# Patient Record
Sex: Male | Born: 1960 | Race: White | Hispanic: No | Marital: Married | Smoking: Never smoker
Health system: Southern US, Community
[De-identification: ages and names within clinical notes are randomized; demographics above are authoritative.]

## PROBLEM LIST (undated history)

## (undated) DIAGNOSIS — E669 Obesity, unspecified: Secondary | ICD-10-CM

## (undated) DIAGNOSIS — M1990 Unspecified osteoarthritis, unspecified site: Secondary | ICD-10-CM

## (undated) DIAGNOSIS — D649 Anemia, unspecified: Secondary | ICD-10-CM

## (undated) DIAGNOSIS — I10 Essential (primary) hypertension: Secondary | ICD-10-CM

## (undated) DIAGNOSIS — K219 Gastro-esophageal reflux disease without esophagitis: Secondary | ICD-10-CM

## (undated) DIAGNOSIS — R911 Solitary pulmonary nodule: Secondary | ICD-10-CM

## (undated) DIAGNOSIS — J309 Allergic rhinitis, unspecified: Secondary | ICD-10-CM

## (undated) DIAGNOSIS — E538 Deficiency of other specified B group vitamins: Secondary | ICD-10-CM

## (undated) DIAGNOSIS — M779 Enthesopathy, unspecified: Secondary | ICD-10-CM

## (undated) HISTORY — DX: Deficiency of other specified B group vitamins: E53.8

## (undated) HISTORY — DX: Gastro-esophageal reflux disease without esophagitis: K21.9

## (undated) HISTORY — DX: Unspecified osteoarthritis, unspecified site: M19.90

## (undated) HISTORY — DX: Obesity, unspecified: E66.9

## (undated) HISTORY — PX: UPPER GASTROINTESTINAL ENDOSCOPY: SHX188

## (undated) HISTORY — DX: Anemia, unspecified: D64.9

## (undated) HISTORY — DX: Enthesopathy, unspecified: M77.9

## (undated) HISTORY — DX: Allergic rhinitis, unspecified: J30.9

## (undated) HISTORY — PX: EYE SURGERY: SHX253

## (undated) HISTORY — PX: BACK SURGERY: SHX140

## (undated) HISTORY — PX: SPINE SURGERY: SHX786

## (undated) HISTORY — PX: COLONOSCOPY: SHX174

---
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
## 1997-08-09 ENCOUNTER — Ambulatory Visit (HOSPITAL_COMMUNITY): Admission: RE | Admit: 1997-08-09 | Discharge: 1997-08-09 | Payer: Self-pay | Admitting: *Deleted

## 1999-11-25 ENCOUNTER — Ambulatory Visit (HOSPITAL_COMMUNITY)
Admission: RE | Admit: 1999-11-25 | Discharge: 1999-11-25 | Payer: Self-pay | Admitting: Physical Medicine and Rehabilitation

## 1999-11-25 ENCOUNTER — Encounter: Payer: Self-pay | Admitting: Physical Medicine and Rehabilitation

## 2001-02-18 HISTORY — PX: CERVICAL DISC SURGERY: SHX588

## 2002-07-21 ENCOUNTER — Ambulatory Visit (HOSPITAL_COMMUNITY): Admission: RE | Admit: 2002-07-21 | Discharge: 2002-07-21 | Payer: Self-pay | Admitting: Internal Medicine

## 2002-07-21 ENCOUNTER — Encounter: Payer: Self-pay | Admitting: Internal Medicine

## 2003-04-23 ENCOUNTER — Emergency Department (HOSPITAL_COMMUNITY): Admission: EM | Admit: 2003-04-23 | Discharge: 2003-04-23 | Payer: Self-pay | Admitting: Emergency Medicine

## 2003-04-24 ENCOUNTER — Inpatient Hospital Stay (HOSPITAL_COMMUNITY): Admission: EM | Admit: 2003-04-24 | Discharge: 2003-04-29 | Payer: Self-pay | Admitting: Emergency Medicine

## 2003-12-26 ENCOUNTER — Ambulatory Visit (HOSPITAL_COMMUNITY): Admission: RE | Admit: 2003-12-26 | Discharge: 2003-12-27 | Payer: Self-pay | Admitting: Neurosurgery

## 2007-09-25 ENCOUNTER — Ambulatory Visit: Payer: Self-pay | Admitting: Cardiology

## 2007-10-16 ENCOUNTER — Ambulatory Visit: Payer: Self-pay

## 2007-10-16 ENCOUNTER — Ambulatory Visit: Payer: Self-pay | Admitting: Cardiology

## 2008-12-29 ENCOUNTER — Encounter: Admission: RE | Admit: 2008-12-29 | Discharge: 2008-12-29 | Payer: Self-pay | Admitting: Neurosurgery

## 2008-12-31 ENCOUNTER — Ambulatory Visit (HOSPITAL_COMMUNITY): Admission: RE | Admit: 2008-12-31 | Discharge: 2009-01-01 | Payer: Self-pay | Admitting: Neurosurgery

## 2010-05-23 LAB — CBC
HCT: 43.1 % (ref 39.0–52.0)
Hemoglobin: 14.9 g/dL (ref 13.0–17.0)
MCHC: 34.7 g/dL (ref 30.0–36.0)
MCV: 90.3 fL (ref 78.0–100.0)
Platelets: 201 10*3/uL (ref 150–400)
RBC: 4.78 MIL/uL (ref 4.22–5.81)
RDW: 13.3 % (ref 11.5–15.5)
WBC: 6 10*3/uL (ref 4.0–10.5)

## 2010-05-23 LAB — BASIC METABOLIC PANEL
BUN: 16 mg/dL (ref 6–23)
CO2: 27 mEq/L (ref 19–32)
Calcium: 9 mg/dL (ref 8.4–10.5)
Chloride: 104 mEq/L (ref 96–112)
Creatinine, Ser: 0.92 mg/dL (ref 0.4–1.5)
GFR calc Af Amer: >60 mL/min (ref >60)
GFR calc non Af Amer: >60 mL/min (ref >60)
Glucose, Bld: 101 mg/dL — ABNORMAL HIGH (ref 70–99)
Potassium: 3.9 mEq/L (ref 3.5–5.1)
Sodium: 136 mEq/L (ref 135–145)

## 2010-07-03 NOTE — Procedures (Signed)
[facility]                              EXERCISE TREADMILL   NAME:Albert Griffin, Albert Griffin                     MRN:          [id number]  DATE:10/16/2007                            DOB:          1961/02/06    PROCEDURE:  Exercise treadmill test.   INDICATIONS:  Evaluate the patient with chest pain.   HISTORY OF PRESENT ILLNESS:  The patient is a pleasant 50 year old  gentleman with chest discomfort.  He was exercised using standard Bruce  protocol.  He was able to exercise for 12 minutes, which completed stage  IV.  The test was terminated because he achieved his target heart rate.  His peak heart rate was 157, which was 90% of predicted.  He achieved  13.6 mets.  His blood pressure had an appropriate response with a max of  187/72.  He had no chest pressure and appropriate shortness of breath.  There were no arrhythmias.  There were no ischemic ST-T wave changes.  He had a normal heart rate recovery.   CONCLUSION:  Negative adequate exercise treadmill test.   PLAN:  Based on the above, the patient has a low post test probability  of high-grade obstructive coronary disease.  He had a good exercise  tolerance.  He is in the low risk category for future cardiovascular  events.  He was given a prescription for exercise based on this study.     Rollene Rotunda, MD, [facility]  Electronically Signed    JH/MedQ  DD: 10/16/2007  DT: 10/17/2007  Job #: [id number]   cc:   Lovenia Kim, D.O.

## 2010-07-03 NOTE — Assessment & Plan Note (Signed)
HEALTHCARE                            CARDIOLOGY OFFICE NOTE   NAME:Albert Griffin, Albert Griffin                     MRN:          [id number]  DATE:09/25/2007                            DOB:          1960-06-21    PRIMARY CARE PHYSICIAN:  Lovenia Kim, DO   REASON FOR CONSULTATION:  Evaluate the patient with chest pain.   HISTORY OF PRESENT ILLNESS:  The patient is a very pleasant 50 year old  white gentleman with no prior cardiac history.  He has had some chest  discomfort.  Over the last 3-4 weeks he has noticed this infrequently  when on his treadmill.  About 3 weeks ago he did have an episode at  rest.  This was a persistent discomfort that lasted for 2 days.  He  described it is left-sided.  It was a pressure-like discomfort.  He had  not had it before.  It was some aching down his left arm, but it is  difficult for him to sort this out from his tendonitis.  It was 3/10 in  intensity.  He did not take anything for it.  It was not exacerbated by  anything.  It went away spontaneously.  He has had some mild nausea  recently, which had been unusual for him.  He still is able to get on a  treadmill and not reproducibly have chest discomfort.  He gets his heart  rates into the 140s often and stays on the treadmill for half hour at a  time 5 days a week.  He denies any palpitation, presyncope, or syncope.  He has had no PND or orthopnea.   PAST MEDICAL HISTORY:  1. Hypertension x15 years.  2. Peptic ulcer disease.  3. Gastroesophageal reflux disease.  4. Tendonitis.   PAST SURGICAL HISTORY:  Lumbar disk surgery x3.   ALLERGIES:  None.   MEDICATIONS:  1. Cozaar 50 mg daily.  2. Nexium 40 mg daily.   SOCIAL HISTORY:  The patient is married.  He has 2 children.  He is a  Games developer.  He never really smoked cigarettes.   FAMILY HISTORY:  Noncontributory for first-degree relatives with early  onset heart disease, so he has multiple  second-degree relatives with  heart disease at later ages.   REVIEW OF SYSTEMS:  As stated in the HPI, positive for occasional  dizziness, some chronic back pains, tendonitis in both arms.   PHYSICAL EXAMINATION:  GENERAL:  The patient is pleasant in no distress.  VITAL SIGNS:  Weight 227 pounds.  Blood pressure 125/86, heart rate 63  and regular.  HEENT:  Eyes unremarkable.  Pupils equal, round, and reactive to light.  Fundi within normal limits.  Oral mucosa unremarkable.  NECK:  No jugular venous distention at 45 degrees.  Carotid upstroke  brisk and symmetrical.  No bruits, no thyromegaly.  LYMPHATICS:  No cervical, axillary, or inguinal adenopathy.  LUNGS:  Clear to auscultation bilaterally.  BACK:  No costovertebral angle tenderness.  CHEST:  Unremarkable.  HEART:  PMI not displaced or sustained.  S1 and S2 within normal limits.  No  S3.  No S4.  No clicks, no rubs, no murmurs.  ABDOMEN:  Flat.  Positive bowel sounds.  Normal in frequency and pitch.  No bruits, no rebound, no guarding.  No midline pulsatile mass.  No  hepatomegaly.  No splenomegaly.  SKIN:  No rashes.  No nodules.  EXTREMITIES:  2+ pulses throughout.  No edema, cyanosis, or clubbing.  NEURO:  Oriented to person, place, and time.  Cranial nerves II through  XII grossly intact.  Motor grossly intact.   EKG (done at Dr. Hardie Pulley office), sinus rhythm, rate 63.  Axis  within normal limits.  Intervals within normal limits.  No acute ST-wave  changes.   ASSESSMENT/PLAN:  1. Chest.  The patient's chest discomfort is somewhat atypical.  I      think the pretest probability of obstructive coronary disease is      low to low moderate.  Given his risk factors, screening with an      exercise treadmill test will be very reasonable.  This will give me      a reasonable chance of ruling out obstructive coronary disease.  It      will allow me to risk stratify and most importantly give him a      prescription for  exercise.  2. Hypertension.  Blood pressure is well controlled and he will      continue the medications as listed.  3. Obesity.  The patient is somewhat overweight and we will discuss      weight loss strategies with diet and exercise.  4. Follow up will be at the time of his treadmill.     Rollene Rotunda, MD, [facility]  Electronically Signed    JH/MedQ  DD: 09/25/2007  DT: 09/26/2007  Job #: [id number]   cc:   Lovenia Kim, D.O.

## 2010-07-06 NOTE — Discharge Summary (Signed)
NAME:  Albert Griffin, Albert Griffin                          ACCOUNT NO.:  [id number]   MEDICAL RECORD NO.:  [id number]                   PATIENT TYPE:  INP   LOCATION:  0449                                 FACILITY:  [facility]   PHYSICIAN:  Ara D. Tammi Klippel, M.D.                DATE OF BIRTH:  1960/10/06   DATE OF ADMISSION:  04/24/2003  DATE OF DISCHARGE:  04/29/2003                                 DISCHARGE SUMMARY   PRIMARY CARE PHYSICIAN:  Lovenia Kim, D.O.   FINAL DIAGNOSES:  1. Left upper extremity cellulitis.  2. Hypertension.  3. Peptic ulcer disease.  4. Chronic low back pain.   FINAL PROCEDURES:  1. Chest x-ray performed April 24, 2003 showing small metallic foreign body     seen in the region of the web space at the left first and second     metacarpals. No evidence by plain film for osteomyelitis.  2. Eye x-rays performed April 26, 2003. Impression, no evidence of metallic     foreign body.  3. MRI of the left upper extremity performed April 27, 2003 showing     edematous/inflammatory changes in the subcutaneous fatty tissues     overlying the olecranon process and the proximal 10 cm or the dorsal     forearm. No sign of drainable abscess or involvement of the deep spaces     of the arm. No sign of osteomyelitis or abnormal fluid in the joints.   PERTINENT LABS AND OTHER TEST RESULTS:  On the day of discharge, white blood  cells 8.6 from a high of 16.3, H&H 14.6/42.5 with a platelet count of 315.  Sodium 138, potassium 4.5, chloride 103, carbon dioxide 30, BUN 12,  creatinine 1.2, glucose 109. Calcium 9.0, C reactive protein 9.9, sed rate  68.   HOSPITAL COURSE:  The patient is a very pleasant 50 year old white male with  past medical history as listed above who was admitted for a left upper  extremity cellulitis.  The patient was started on IV ampicillin/sulbactam  and after two days his rash and erythema eventually progressed from the line  of demarcation. He thereafter  progressively felt symptomatically better with  less edema, less erythema, and less pain. On April 28, 2003, the  ampicillin/sulbactam was discontinued and he was started on  amoxicillin/clavulanic acid. He had no resumption of fevers, no chills,  sweats, and no increase in his white blood cell count. On the day of  discharge, the patient was more than eager to go home and leave the  hospital.   DISCHARGE MEDICATIONS:  1. Amoxicillin/clavulanic acid 875 mg p.o. b.i.d. x14 days.  2. Losartan 50 mg p.o. q.d.  3. Esomeprazole 40 mg p.o. q.d.  4. Meloxicam 7.5 mg p.o. q.d.   DISCHARGE INSTRUCTIONS:  1. The patient is to take his medications as prescribed.  2. He is to take his antibiotic course fully and completely.  3. He is to followup with Dr. Elisabeth Most in approximately two weeks.  4. He is to return if he feels worse.                                               Ara D. Tammi Klippel, M.D.    ADM/MEDQ  D:  04/29/2003  T:  04/30/2003  Job:  [id number]   cc:   Lovenia Kim, D.O.  [address]  [place]  [place] [address]  Fax: [phone]

## 2010-07-06 NOTE — H&P (Signed)
[name], Albert Griffin                          ACCOUNT NO.:  [id number]   MEDICAL RECORD NO.:  [id number]                   PATIENT TYPE:  INP   LOCATION:  0449                                 FACILITY:  [facility]   PHYSICIAN:  Renato Battles, M.D.                  DATE OF BIRTH:  March 10, 1960   DATE OF ADMISSION:  04/23/2003  DATE OF DISCHARGE:                                HISTORY & PHYSICAL   REASON FOR ADMISSION:  Left upper extremity erythema.   HISTORY OF PRESENT ILLNESS:  The patient is a 50 year old white male with a  left upper extremity lesion that started as a very small lesion on Thursday,  of unknown origin, probably from his work.  The patient started to take  Keflex on Friday evening, and Saturday he woke up with large erythema  extending over most of his upper extremity left forearm.  The patient denied  any discomfort.  No nausea or vomiting.  He reports occasional fever.   REVIEW OF SYSTEMS:  As per HPI.   PAST MEDICAL HISTORY:  1. Hypertension.  2. Peptic ulcer disease.   PAST SURGICAL HISTORY:  None.   ALLERGIES:  No known drug allergies according to the patient.   FAMILY HISTORY:  Mother with cancer.   SOCIAL HISTORY:  Denies tobacco or drugs.  Reports occasional alcohol.  Works as a Curator.   MEDICATIONS:  1. Cozaar 50 mg p.o. daily.  2. Nexium 40 mg p.o. daily.  3. Mobic 7.5 mg p.o. daily.   PHYSICAL EXAM:  GENERAL:  Alert and oriented x3.  In no acute distress.  VITAL SIGNS:  Temperature 101.5, heart rate 105, respiratory rate 18, blood  pressure 129/74.  HEENT:  Head is normocephalic, atraumatic.  Pupils are equal, round, and  reactive to light and accommodation.  NECK:  No lymphadenopathy, no thyromegaly, no JVD.  CHEST:  Clear to auscultation bilaterally.  No wheezing, rales, or rhonchi.  HEART:  Regular rate and rhythm.  No murmurs, gallops, or rubs.  ABDOMEN:  Soft, nontender, nondistended.  Normoactive bowel sounds.  EXTREMITIES:  No cyanosis  or clubbing.  Has extensive erythema left upper  extremity with some streaking.  No fluctuation.   LABORATORY STUDIES:  White count 16.3 with 85% neutrophils.  __________ are  within normal.  Liver functions within normal.  Blood culture is pending.   ASSESSMENT AND PLAN:  1. Cellulitis, most likely secondary to Streptococcus.  Blood culture is     currently pending.  Am going to start the patient on IV Unasyn and use     ibuprofen for analgesia.  2. Hypertension.  Continue Cozaar and start low-salt diet.  3. Peptic ulcer disease.  The patient will be placed on Protonix during his     hospitalization.   PRIMARY CARE DOCTOR:  Lovenia Kim, D.O.  Renato Battles, M.D.    SA/MEDQ  D:  04/24/2003  T:  04/24/2003  Job:  [id number]

## 2010-07-06 NOTE — Op Note (Signed)
NAMEJahmani, Staup Aaden              ACCOUNT NO.:  [id number]   MEDICAL RECORD NO.:  [id number]          PATIENT TYPE:  OIB   LOCATION:  2864                         FACILITY:  [facility]   PHYSICIAN:  Hewitt Shorts, M.D.DATE OF BIRTH:  01/31/1961   DATE OF PROCEDURE:  12/26/2003  DATE OF DISCHARGE:                                 OPERATIVE REPORT   PREOPERATIVE DIAGNOSIS:  Right L5-S1 lumbar disk herniation, lumbar  degenerative disk disease, lumbar spondylosis and lumbar radiculopathy.   POSTOPERATIVE DIAGNOSIS:  Right L5-S1 lumbar disk herniation, lumbar  degenerative disk disease, lumbar spondylosis and lumbar radiculopathy.   OPERATION PERFORMED:  Right L5-S1 lumbar laminotomy and microdiskectomy with  microdissection.   SURGEON:  Hewitt Shorts, M.D.   ASSISTANT:  Hilda Lias, M.D.   ANESTHESIA:  General endotracheal.   INDICATIONS FOR PROCEDURE:  The patient is a 50 year old man who presented  with a right lumbar radiculopathy.  He was found by MRI scan to have a large  right L5-S1 lumbar disk herniation.  A decision was made to proceed with  elective laminotomy and microdiskectomy.   DESCRIPTION OF PROCEDURE:  The patient was brought to the operating room and  placed under general endotracheal anesthesia.  The patient was turned to a  prone position.  Lumbar region was prepped with Betadine soap and solution  and draped in sterile fashion.  The midline was infiltrated with local  anesthetic with epinephrine.  X-ray was taken and the L5-S1 level  identified.  A midline incision was made and carried down to the  subcutaneous tissue.  Bipolar cautery and electrocautery were used to  maintain hemostasis.  Dissection was carried down to the lumbar fascia which  was incised on the right side of the midline in the paraspinal muscles.  We  dissected the spinous process and lamina in subperiosteal fashion.  Another  X-ray was taken and the L5-S1 interlaminar space was  identified.  A  laminotomy was performed using the X-Max drill and Kerrison punches.  The  operating microscope was draped and brought into the field to provide  additional magnification, illumination and visualization and the remainder  of the procedure was performed using microdissection and microsurgical  technique.   The ligamentum flavum was carefully resected and we identified the thecal  sac and exiting right S1 nerve root.  These structures were gently retracted  medially.  Epidural veins were coagulated and divided as necessary.  We were  able to identify the disk herniation.  We incised the annulus and the disk  fragment extruded.  We were able to remove that fragment and then proceeded  with thorough diskectomy removing all loose fragments of disk material from  both the disk space and the epidural space and good decompression of the  thecal sac and nerve root was achieved.  Once the decompression was  completed, hemostasis was established with the use of bipolar cautery.  The  wound was irrigated with bacitracin solution and checked once again for  hemostasis, then we instilled 2 mL of fentanyl and 80 mg of Depo-Medrol into  the epidural space and  proceeded with closure.  The deep fascia was closed  with #1 interrupted undyed Vicryl sutures, the subcutaneous and subcuticular  layer were closed with interrupted inverted 2-0 undyed Vicryl sutures and  skin edges closed with Dermabond.  The patient tolerated the procedure  well.  The estimated blood loss for this procedure was less than 25 mL.  Sponge, needle and instrument counts were correct.  Following surgery the  patient was turned back to supine position to be reversed from anesthetic,  extubated and transferred to recovery room for further care.       RWN/MEDQ  D:  12/26/2003  T:  12/26/2003  Job:  [id number]

## 2011-05-31 ENCOUNTER — Observation Stay (HOSPITAL_COMMUNITY)
Admission: EM | Admit: 2011-05-31 | Discharge: 2011-06-01 | DRG: 140 | Disposition: A | Discharge status: Home / Self Care | Payer: BC Managed Care – PPO | Attending: Internal Medicine | Admitting: Internal Medicine

## 2011-05-31 ENCOUNTER — Encounter (HOSPITAL_COMMUNITY): Payer: Self-pay | Admitting: *Deleted

## 2011-05-31 ENCOUNTER — Emergency Department (HOSPITAL_COMMUNITY): Payer: BC Managed Care – PPO

## 2011-05-31 ENCOUNTER — Other Ambulatory Visit: Payer: Self-pay

## 2011-05-31 DIAGNOSIS — R002 Palpitations: Principal | ICD-10-CM | POA: Insufficient documentation

## 2011-05-31 DIAGNOSIS — R55 Syncope and collapse: Secondary | ICD-10-CM

## 2011-05-31 DIAGNOSIS — I10 Essential (primary) hypertension: Secondary | ICD-10-CM | POA: Insufficient documentation

## 2011-05-31 DIAGNOSIS — R42 Dizziness and giddiness: Secondary | ICD-10-CM | POA: Insufficient documentation

## 2011-05-31 DIAGNOSIS — M79609 Pain in unspecified limb: Secondary | ICD-10-CM | POA: Insufficient documentation

## 2011-05-31 HISTORY — DX: Essential (primary) hypertension: I10

## 2011-05-31 LAB — CBC
HCT: 42.8 % (ref 39.0–52.0)
Hemoglobin: 14.9 g/dL (ref 13.0–17.0)
MCH: 30.3 pg (ref 26.0–34.0)
MCHC: 34.8 g/dL (ref 30.0–36.0)
RDW: 13.2 % (ref 11.5–15.5)

## 2011-05-31 LAB — CK TOTAL AND CKMB (NOT AT ARMC)
CK, MB: 4.1 ng/mL — ABNORMAL HIGH (ref 0.3–4.0)
Relative Index: 1.6 (ref 0.0–2.5)
Total CK: 257 U/L — ABNORMAL HIGH (ref 7–232)

## 2011-05-31 LAB — COMPREHENSIVE METABOLIC PANEL
Albumin: 4.3 g/dL (ref 3.5–5.2)
BUN: 17 mg/dL (ref 6–23)
Calcium: 9.7 mg/dL (ref 8.4–10.5)
Creatinine, Ser: 1 mg/dL (ref 0.50–1.35)
Total Protein: 7.2 g/dL (ref 6.0–8.3)

## 2011-05-31 LAB — DIFFERENTIAL
Basophils Relative: 0 % (ref 0–1)
Eosinophils Absolute: 0 10*3/uL (ref 0.0–0.7)
Monocytes Absolute: 0.4 10*3/uL (ref 0.1–1.0)
Monocytes Relative: 4 % (ref 3–12)

## 2011-05-31 LAB — POCT I-STAT TROPONIN I: Troponin i, poc: 0 ng/mL (ref 0.00–0.08)

## 2011-05-31 NOTE — ED Notes (Signed)
The pt has had approx 14  Head rushes and felt like he was going to pass out.  He had some lt arm pain and numbness.   Alert no distress

## 2011-06-01 ENCOUNTER — Encounter (HOSPITAL_COMMUNITY): Payer: Self-pay | Admitting: Internal Medicine

## 2011-06-01 ENCOUNTER — Observation Stay (HOSPITAL_COMMUNITY): Payer: BC Managed Care – PPO

## 2011-06-01 DIAGNOSIS — R072 Precordial pain: Secondary | ICD-10-CM

## 2011-06-01 DIAGNOSIS — I200 Unstable angina: Secondary | ICD-10-CM

## 2011-06-01 DIAGNOSIS — R079 Chest pain, unspecified: Secondary | ICD-10-CM

## 2011-06-01 DIAGNOSIS — I10 Essential (primary) hypertension: Secondary | ICD-10-CM | POA: Diagnosis present

## 2011-06-01 LAB — URINALYSIS, ROUTINE W REFLEX MICROSCOPIC
Bilirubin Urine: NEGATIVE
Ketones, ur: 15 mg/dL — AB
Nitrite: NEGATIVE
Urobilinogen, UA: 0.2 mg/dL (ref 0.0–1.0)
pH: 6 (ref 5.0–8.0)

## 2011-06-01 LAB — LIPID PANEL
HDL: 52 mg/dL (ref >39)
LDL Cholesterol: 84 mg/dL (ref 0–99)
Total CHOL/HDL Ratio: 2.8 RATIO
Triglycerides: 36 mg/dL (ref <150)

## 2011-06-01 LAB — CBC
Hemoglobin: 13.5 g/dL (ref 13.0–17.0)
MCHC: 34.1 g/dL (ref 30.0–36.0)
RBC: 4.53 MIL/uL (ref 4.22–5.81)
WBC: 7.2 10*3/uL (ref 4.0–10.5)

## 2011-06-01 LAB — COMPREHENSIVE METABOLIC PANEL
ALT: 19 U/L (ref 0–53)
BUN: 16 mg/dL (ref 6–23)
CO2: 25 mEq/L (ref 19–32)
Calcium: 9.2 mg/dL (ref 8.4–10.5)
Creatinine, Ser: 0.91 mg/dL (ref 0.50–1.35)
GFR calc Af Amer: >90 mL/min (ref >90)
GFR calc non Af Amer: >90 mL/min (ref >90)
Glucose, Bld: 94 mg/dL (ref 70–99)

## 2011-06-01 LAB — CARDIAC PANEL(CRET KIN+CKTOT+MB+TROPI)
CK, MB: 3.2 ng/mL (ref 0.3–4.0)
Total CK: 179 U/L (ref 7–232)
Total CK: 175 U/L (ref 7–232)
Troponin I: <0.3 ng/mL (ref <0.30)

## 2011-06-01 LAB — HEMOGLOBIN A1C: Hgb A1c MFr Bld: 5.6 % (ref <5.7)

## 2011-06-01 LAB — MAGNESIUM: Magnesium: 2 mg/dL (ref 1.5–2.5)

## 2011-06-01 MED ORDER — DOCUSATE SODIUM 100 MG PO CAPS
100.0000 mg | ORAL_CAPSULE | Freq: Two times a day (BID) | ORAL | Status: DC
  Administered 2011-06-01: 100 mg via ORAL

## 2011-06-01 MED ORDER — ASPIRIN EC 325 MG PO TBEC
325.0000 mg | DELAYED_RELEASE_TABLET | Freq: Every day | ORAL | Status: DC
  Administered 2011-06-01: 325 mg via ORAL
  Filled 2011-06-01: qty 1

## 2011-06-01 MED ORDER — ACETAMINOPHEN 650 MG RE SUPP: 650.0000 mg | Freq: Four times a day (QID) | RECTAL | Status: DC | PRN

## 2011-06-01 MED ORDER — ALUM & MAG HYDROXIDE-SIMETH 200-200-20 MG/5ML PO SUSP: 30.0000 mL | Freq: Four times a day (QID) | ORAL | Status: DC | PRN

## 2011-06-01 MED ORDER — TECHNETIUM TC 99M TETROFOSMIN IV KIT
30.0000 | PACK | Freq: Once | INTRAVENOUS | Status: AC | PRN
  Administered 2011-06-01: 30 via INTRAVENOUS

## 2011-06-01 MED ORDER — HEPARIN BOLUS VIA INFUSION
4000.0000 [IU] | Freq: Once | INTRAVENOUS | Status: AC
  Administered 2011-06-01: 4000 [IU] via INTRAVENOUS

## 2011-06-01 MED ORDER — LOSARTAN POTASSIUM 50 MG PO TABS
50.0000 mg | ORAL_TABLET | Freq: Every day | ORAL | Status: DC
  Administered 2011-06-01: 50 mg via ORAL
  Filled 2011-06-01: qty 1

## 2011-06-01 MED ORDER — HYDROCODONE-ACETAMINOPHEN 5-325 MG PO TABS: 1.0000 | ORAL_TABLET | ORAL | Status: DC | PRN

## 2011-06-01 MED ORDER — SODIUM CHLORIDE 0.9 % IV SOLN: INTRAVENOUS | Status: DC

## 2011-06-01 MED ORDER — ONDANSETRON HCL 4 MG PO TABS: 4.0000 mg | ORAL_TABLET | Freq: Four times a day (QID) | ORAL | Status: DC | PRN

## 2011-06-01 MED ORDER — NITROGLYCERIN 0.4 MG SL SUBL: 0.4000 mg | SUBLINGUAL_TABLET | SUBLINGUAL | Status: DC | PRN

## 2011-06-01 MED ORDER — HEPARIN (PORCINE) IN NACL 100-0.45 UNIT/ML-% IJ SOLN
1200.0000 [IU]/h | INTRAMUSCULAR | Status: DC
Start: 2011-06-01 — End: 2011-06-01
  Administered 2011-06-01: 1200 [IU]/h via INTRAVENOUS
  Filled 2011-06-01 (×3): qty 250

## 2011-06-01 MED ORDER — ACETAMINOPHEN 325 MG PO TABS: 650.0000 mg | ORAL_TABLET | Freq: Four times a day (QID) | ORAL | Status: DC | PRN

## 2011-06-01 MED ORDER — GUAIFENESIN-DM 100-10 MG/5ML PO SYRP: 5.0000 mL | ORAL_SOLUTION | ORAL | Status: DC | PRN

## 2011-06-01 MED ORDER — SODIUM CHLORIDE 0.9 % IJ SOLN
3.0000 mL | Freq: Two times a day (BID) | INTRAMUSCULAR | Status: DC
  Administered 2011-06-01: 3 mL via INTRAVENOUS

## 2011-06-01 MED ORDER — ASPIRIN 325 MG PO TBEC: 325.0000 mg | DELAYED_RELEASE_TABLET | Freq: Every day | ORAL | Status: AC

## 2011-06-01 MED ORDER — SODIUM CHLORIDE 0.9 % IV SOLN
INTRAVENOUS | Status: AC
  Administered 2011-06-01: 03:00:00 via INTRAVENOUS

## 2011-06-01 MED ORDER — ONDANSETRON HCL 4 MG/2ML IJ SOLN: 4.0000 mg | Freq: Four times a day (QID) | INTRAMUSCULAR | Status: DC | PRN

## 2011-06-01 MED ORDER — PANTOPRAZOLE SODIUM 40 MG PO TBEC
40.0000 mg | DELAYED_RELEASE_TABLET | Freq: Every day | ORAL | Status: DC
  Administered 2011-06-01: 40 mg via ORAL
  Filled 2011-06-01: qty 1

## 2011-06-01 MED ORDER — TECHNETIUM TC 99M TETROFOSMIN IV KIT
10.0000 | PACK | Freq: Once | INTRAVENOUS | Status: AC | PRN
  Administered 2011-06-01: 10 via INTRAVENOUS

## 2011-06-01 NOTE — ED Notes (Signed)
Patient transported to X-ray 

## 2011-06-01 NOTE — Discharge Summary (Signed)
 Physician Discharge Summary  Patient ID: TYSHEEM ACCARDO MRN: [id number] DOB/AGE: Jul 16, 1960 51 y.o.  Admit date: 05/31/2011 Discharge date: 06/01/2011  Primary Care Physician:  Pcp Not In System  Discharge Diagnoses:    .Palpitations .HTN (hypertension) .Chest pain .Light headedness  Consults:  Cardiology [facility], Dr. Gala Romney    Discharge Medications: Medication List  As of 06/01/2011  8:28 AM   TAKE these medications         aspirin 325 MG EC tablet   Take 1 tablet (325 mg total) by mouth daily.      calcium carbonate 600 MG Tabs   Commonly known as: OS-CAL   Take 600 mg by mouth 2 (two) times daily with a meal.      cholecalciferol 1000 UNITS tablet   Commonly known as: VITAMIN D   Take 1,000 Units by mouth daily.      cyanocobalamin 500 MCG tablet   Take 500 mcg by mouth daily.      esomeprazole 40 MG capsule   Commonly known as: NEXIUM   Take 40 mg by mouth daily before breakfast.      fexofenadine 180 MG tablet   Commonly known as: ALLEGRA   Take 180 mg by mouth daily.      losartan 50 MG tablet   Commonly known as: COZAAR   Take 50 mg by mouth daily.      mulitivitamin with minerals Tabs   Take 1 tablet by mouth daily.      nitroGLYCERIN 0.4 MG SL tablet   Commonly known as: NITROSTAT   Place 1 tablet (0.4 mg total) under the tongue every 5 (five) minutes as needed for chest pain.             Brief H and P: For complete details please refer to admission H and P, but in brief patient is a 51 year old male with past medical history of hypertension, presented with 24-hour of recurrent episodes of presyncope and palpitations associated with chest pain, radiating to left arm with nausea. Episodes were exertional and improved after stopping activity. Patient was admitted for further workup with a question of unstable angina.  Hospital Course:    *Chest pain: Resolved, patient was admitted to telemetry floor, 3 sets of cardiac enzymes were negative.  Cardiology was consulted and recommended stress test and 2-D echocardiogram. patient was briefly placed on heparin drip which was eventually discontinued with resolution of the chest pain and cardiac markers remain negative. Patient underwent nuclear medicine stress test which showed EF of 52% with no reversible ischemia or infarction normal wall motion. 2-D echo is also pending at the time of discharge which will be followed by his cardiologist Dr. Eden Emms or Dr. Gala Romney.    Palpitations/near-syncope: Resolved, patient had no acute arrhythmias on the telemetry, 2-D echo results pending   HTN (hypertension): Remained stable    Day of Discharge BP 129/76  Pulse 72  Temp(Src) 97.2 F (36.2 C) (Oral)  Resp 18  Ht 5\' 8"  (1.727 m)  Wt 102.1 kg (225 lb 1.4 oz)  BMI 34.22 kg/m2  SpO2 97%  Physical Exam: General: Alert and awake oriented x3 not in any acute distress. HEENT: anicteric sclera, pupils reactive to light and accommodation CVS: S1-S2 clear no murmur rubs or gallops Chest: clear to auscultation bilaterally, no wheezing rales or rhonchi Abdomen: soft nontender, nondistended, normal bowel sounds, no organomegaly Extremities: no cyanosis, clubbing or edema noted bilaterally Neuro: Cranial nerves II-XII intact, no focal neurological deficits  The results of significant diagnostics from this hospitalization (including imaging, microbiology, ancillary and laboratory) are listed below for reference.    LAB RESULTS: Basic Metabolic Panel:  Lab 06/01/11 1610 05/31/11 2158  NA 144 141  K 3.6 4.2  CL 109 104  CO2 25 27  GLUCOSE 94 112*  BUN 16 17  CREATININE 0.91 1.00  CALCIUM 9.2 9.7  MG 2.0 --  PHOS 2.5 --   Liver Function Tests:  Lab 06/01/11 0625 05/31/11 2158  AST 19 27  ALT 19 24  ALKPHOS 65 74  BILITOT 0.7 0.6  PROT 6.1 7.2  ALBUMIN 3.7 4.3   CBC:  Lab 06/01/11 0625 05/31/11 2158  WBC 7.2 9.3  NEUTROABS -- 7.2  HGB 13.5 14.9  HCT 39.6 42.8  MCV 87.4 --    PLT 184 225   Cardiac Enzymes:  Lab 06/01/11 0625 05/31/11 2158  CKTOTAL 179 257*  CKMB 3.2 4.1*  CKMBINDEX -- --  TROPONINI <0.30 --   BNP: No components found with this basename: POCBNP:2 CBG: No results found for this basename: GLUCAP:2 in the last 168 hours  Significant Diagnostic Studies:  No results found.   Disposition and Follow-up: Discharge Orders    Future Appointments: Provider: Department: Dept Phone: Center:   06/01/2011 9:20 AM Mc[facility]Nm 1 [facility] Medicine  [facility]   06/01/2011 10:50 AM Mc-Nm Stress 1 [facility]-Nuclear Medicine  [facility]     Future Orders Please Complete By Expires   Diet - low sodium heart healthy      Increase activity slowly          DISPOSITION: Home  DIET: Heart healthy   ACTIVITY: As tolerated  TESTS THAT NEED FOLLOW-UP 2-D echo results  DISCHARGE FOLLOW-UP Follow-up Information    Follow up with Charlton Haws, MD. Schedule an appointment as soon as possible for a visit in 2 weeks. (for follow-up)    Contact information:   [address] [place] [address] [phone]          Time spent on Discharge: 45 minutes  Signed:  , M.D. [facility] 06/01/2011, 8:28 AM

## 2011-06-01 NOTE — Consult Note (Signed)
Reason for Consult:Chest pain Referring Physician: Dr. Susann Givens Albert Griffin is an 51 y.o. male with a history of HTN and GERD who presents with a sudden episode of chest pain that started at about 11pm last night. HPI: Pain was located over the left side of the chest, moderate in intensity, radiating down the left arm, had no noticeable aggravating or relieving factors. Pain occurred with rest this time but his wife states he has had similar pain in the past precipitated by physical workout activity. Pain was associated with diaphoresis , nausea and near syncope. He has had stress tests in the past but the last was more than 4 yrs ago. His EKG was normal and cardiac enzymes negative so his admitting diagnosis was unstable angina. Of note, he admits to a strong family history of CAD. 5 uncles had CAD in their 49s.   Past Medical History  Diagnosis Date  . Hypertension     Past Surgical History  Procedure Date  . Back surgery     Family History  Problem Relation Age of Onset  . Lung cancer Mother   . Breast cancer Mother   . Esophageal cancer Father   . Heart disease Sister   . Heart disease Maternal Uncle     Social History:  reports that he has quit smoking. He has quit using smokeless tobacco. He reports that he drinks alcohol. He reports that he does not use illicit drugs.  Allergies:  Allergies  Allergen Reactions  . Iodides Hives    Medications: I have reviewed the patient's current medications.  Results for orders placed during the hospital encounter of 05/31/11 (from the past 48 hour(s))  CBC     Status: Normal   Collection Time   05/31/11  9:58 PM      Component Value Range Comment   WBC 9.3  4.0 - 10.5 (K/uL)    RBC 4.91  4.22 - 5.81 (MIL/uL)    Hemoglobin 14.9  13.0 - 17.0 (g/dL)    HCT 95.6  21.3 - 08.6 (%)    MCV 87.2  78.0 - 100.0 (fL)    MCH 30.3  26.0 - 34.0 (pg)    MCHC 34.8  30.0 - 36.0 (g/dL)    RDW 57.8  46.9 - 62.9 (%)    Platelets 225  150 -  400 (K/uL)   DIFFERENTIAL     Status: Abnormal   Collection Time   05/31/11  9:58 PM      Component Value Range Comment   Neutrophils Relative 78 (*) 43 - 77 (%)    Neutro Abs 7.2  1.7 - 7.7 (K/uL)    Lymphocytes Relative 18  12 - 46 (%)    Lymphs Abs 1.7  0.7 - 4.0 (K/uL)    Monocytes Relative 4  3 - 12 (%)    Monocytes Absolute 0.4  0.1 - 1.0 (K/uL)    Eosinophils Relative 0  0 - 5 (%)    Eosinophils Absolute 0.0  0.0 - 0.7 (K/uL)    Basophils Relative 0  0 - 1 (%)    Basophils Absolute 0.0  0.0 - 0.1 (K/uL)   COMPREHENSIVE METABOLIC PANEL     Status: Abnormal   Collection Time   05/31/11  9:58 PM      Component Value Range Comment   Sodium 141  135 - 145 (mEq/L)    Potassium 4.2  3.5 - 5.1 (mEq/L)    Chloride 104  96 - 112 (  mEq/L)    CO2 27  19 - 32 (mEq/L)    Glucose, Bld 112 (*) 70 - 99 (mg/dL)    BUN 17  6 - 23 (mg/dL)    Creatinine, Ser 1.61  0.50 - 1.35 (mg/dL)    Calcium 9.7  8.4 - 10.5 (mg/dL)    Total Protein 7.2  6.0 - 8.3 (g/dL)    Albumin 4.3  3.5 - 5.2 (g/dL)    AST 27  0 - 37 (U/L)    ALT 24  0 - 53 (U/L)    Alkaline Phosphatase 74  39 - 117 (U/L)    Total Bilirubin 0.6  0.3 - 1.2 (mg/dL)    GFR calc non Af Amer 87 (*) >90 (mL/min)    GFR calc Af Amer >90  >90 (mL/min)   CK TOTAL AND CKMB     Status: Abnormal   Collection Time   05/31/11  9:58 PM      Component Value Range Comment   Total CK 257 (*) 7 - 232 (U/L)    CK, MB 4.1 (*) 0.3 - 4.0 (ng/mL)    Relative Index 1.6  0.0 - 2.5    POCT I-STAT TROPONIN I     Status: Normal   Collection Time   05/31/11 10:02 PM      Component Value Range Comment   Troponin i, poc 0.00  0.00 - 0.08 (ng/mL)    Comment 3              Dg Chest Portable 1 View  06/01/2011  *RADIOLOGY REPORT*  Clinical Data: Near-syncope.  Hypertension.  CHEST - 1 VIEW  Comparison:  12/31/2008  Findings: The heart size and mediastinal contours are within normal limits.  Both lungs are clear.  IMPRESSION: No active disease.  Original Report  Authenticated By: Danae Orleans, M.D.    Review of Systems  Constitutional: Negative.   HENT: Negative.   Eyes: Negative.   Respiratory: Positive for shortness of breath.   Cardiovascular: Positive for chest pain and leg swelling. Negative for palpitations, orthopnea, claudication and PND.  Gastrointestinal: Positive for heartburn.  Genitourinary: Negative.   Musculoskeletal: Negative.   Skin: Negative.   Neurological: Positive for dizziness.  Endo/Heme/Allergies: Negative.   Psychiatric/Behavioral: Negative.    Blood pressure 135/85, pulse 62, temperature 98.4 F (36.9 C), temperature source Oral, resp. rate 18, height 5\' 8"  (1.727 m), weight 225 lb 1.4 oz (102.1 kg), SpO2 99.00%. Physical Exam  Constitutional: He is oriented to person, place, and time. He appears well-developed and well-nourished. No distress.  HENT:  Head: Normocephalic.  Mouth/Throat: No oropharyngeal exudate.  Eyes: Conjunctivae are normal. No scleral icterus.  Neck: No JVD present.  Cardiovascular: Normal rate, regular rhythm, normal heart sounds and intact distal pulses.  Exam reveals no gallop and no friction rub.   No murmur heard. Respiratory: Effort normal and breath sounds normal.  GI: Soft. He exhibits no distension. There is no tenderness.  Musculoskeletal: Normal range of motion. He exhibits no edema and no tenderness.  Lymphadenopathy:    He has no cervical adenopathy.  Neurological: He is alert and oriented to person, place, and time.  Skin: Skin is warm and dry. No rash noted. He is not diaphoretic. No erythema. No pallor.  Psychiatric: He has a normal mood and affect. His behavior is normal. Judgment and thought content normal.    Assessment/Plan: Unstable angina in a 51 yr old man with a strong family history of CAD but  normal EKG and enzymes  Recommendations ASA, Plavix,BB,ACEI,Statins Given his low TIMI score, may actually stop heparin (particularly as he is also chest pain  free) Obtain 2D echo Nuclear stress test  Grandville Silos 06/01/2011, 6:28 AM

## 2011-06-01 NOTE — ED Provider Notes (Signed)
History     CSN: [id number]  Arrival date & time 05/31/11  2041   First MD Initiated Contact with Patient 05/31/11 2335      Chief Complaint  Patient presents with  . Near Syncope    (Consider location/radiation/quality/duration/timing/severity/associated sxs/prior treatment) The history is provided by the patient.   patient's had multiple episodes today where he gets "head rush" feeling lightheaded and near syncopal. Some of these episodes are associated with palpitations. he has had them while standing, while exerting himself and in multiple different situations that do not seem to be exacerbated by anything in particular. No symptoms since she's been in the emergency department. Symptoms last seconds. No syncope. No chest pain or shortness of breath. He did develop left arm pain radiating down his arm associated with some numbness and no history of same. Patient treated for hypertension and had reported normal stress test about 3-4 years ago. No history of similar symptoms prior to today. Patient denies recent alcohol or any drug use. Has never smoked. He does work out regularly has never had symptoms with working out. Sister recently received a pacemaker for sick sinus syndrome. He has multiple family members with coronary artery disease.  Past Medical History  Diagnosis Date  . Hypertension     History reviewed. No pertinent past surgical history.  No family history on file.  History  Substance Use Topics  . Smoking status: Never Smoker   . Smokeless tobacco: Not on file  . Alcohol Use: Yes      Review of Systems  Constitutional: Negative for fever and chills.  HENT: Negative for neck pain and neck stiffness.   Eyes: Negative for pain.  Respiratory: Negative for shortness of breath.   Cardiovascular: Positive for palpitations. Negative for chest pain.  Gastrointestinal: Negative for abdominal pain.  Genitourinary: Negative for dysuria.  Musculoskeletal: Negative for  back pain.  Skin: Negative for rash.  Neurological: Positive for light-headedness and numbness. Negative for syncope, weakness and headaches.  All other systems reviewed and are negative.    Allergies  Iodides  Home Medications   Current Outpatient Rx  Name Route Sig Dispense Refill  . CALCIUM CARBONATE 600 MG PO TABS Oral Take 600 mg by mouth 2 (two) times daily with a meal.    . VITAMIN D 1000 UNITS PO TABS Oral Take 1,000 Units by mouth daily.    . CYANOCOBALAMIN 500 MCG PO TABS Oral Take 500 mcg by mouth daily.    Marland Kitchen ESOMEPRAZOLE MAGNESIUM 40 MG PO CPDR Oral Take 40 mg by mouth daily before breakfast.    . FEXOFENADINE HCL 180 MG PO TABS Oral Take 180 mg by mouth daily.    Marland Kitchen LOSARTAN POTASSIUM 50 MG PO TABS Oral Take 50 mg by mouth daily.    . ADULT MULTIVITAMIN W/MINERALS CH Oral Take 1 tablet by mouth daily.      BP 122/82  Pulse 67  Temp(Src) 98.4 F (36.9 C) (Oral)  Resp 17  SpO2 99%  Physical Exam  Constitutional: He is oriented to person, place, and time. He appears well-developed and well-nourished.  HENT:  Head: Normocephalic and atraumatic.  Eyes: Conjunctivae and EOM are normal. Pupils are equal, round, and reactive to light.  Neck: Trachea normal. Neck supple. No thyromegaly present.  Cardiovascular: Normal rate, regular rhythm, S1 normal, S2 normal and normal pulses.     No systolic murmur is present   No diastolic murmur is present  Pulses:  Radial pulses are 2+ on the right side, and 2+ on the left side.  Pulmonary/Chest: Effort normal and breath sounds normal. He has no wheezes. He has no rhonchi. He has no rales. He exhibits no tenderness.  Abdominal: Soft. Normal appearance and bowel sounds are normal. There is no tenderness. There is no CVA tenderness and negative Murphy's sign.  Musculoskeletal:       BLE:s Calves nontender, no cords or erythema, negative Homans sign  Neurological: He is alert and oriented to person, place, and time. He has  normal strength. No cranial nerve deficit or sensory deficit. GCS eye subscore is 4. GCS verbal subscore is 5. GCS motor subscore is 6.  Skin: Skin is warm and dry. No rash noted. He is not diaphoretic.  Psychiatric: His speech is normal.       Cooperative and appropriate    ED Course  Procedures (including critical care time)  Labs Reviewed  DIFFERENTIAL - Abnormal; Notable for the following:    Neutrophils Relative 78 (*)    All other components within normal limits  COMPREHENSIVE METABOLIC PANEL - Abnormal; Notable for the following:    Glucose, Bld 112 (*)    GFR calc non Af Amer 87 (*)    All other components within normal limits  CK TOTAL AND CKMB - Abnormal; Notable for the following:    Total CK 257 (*)    CK, MB 4.1 (*)    All other components within normal limits  CBC  POCT I-STAT TROPONIN I  URINALYSIS, ROUTINE W REFLEX MICROSCOPIC    Date: 06/01/2011  Rate: 72  Rhythm: normal sinus rhythm  QRS Axis: normal  Intervals: normal  ST/T Wave abnormalities: nonspecific ST changes  Conduction Disutrbances:none  Narrative Interpretation:   Old EKG Reviewed: none available  Labs obtained and reviewed as above. Chest x-ray obtained and reviewed  1. Near syncope   2. Palpitations     12:40 AM discussed with triad hospitalist on call, DR Kara Pacer,  who will admit patient to telemetry for further evaluation.  MDM   Near-syncope and palpitations concerning for possible arrhythmia. No symptoms and 80. EKG reviewed. Labs obtained and reviewed as above. Plan admission for cardiac monitoring and further evaluation of symptoms.        Sunnie Nielsen, MD 06/01/11 [phone]

## 2011-06-01 NOTE — Progress Notes (Signed)
Albert Griffin is a 51 y.o. male seen in consult last night by the fellow with chest pain. Enzymes have been negative. ETT-myoview and echo ordered.  ETT Myoview Patient exercised for 10:40. Good exercise tolerance. Appropriate BP response. No chest pain. No ECG changes to suggest ischemia. Images pending.  Signed,  Tereso Newcomer, PA-C  10:41 AM 06/01/2011

## 2011-06-01 NOTE — Progress Notes (Signed)
  Echocardiogram 2D Echocardiogram has been performed.  Albert Griffin A 06/01/2011, 3:25 PM

## 2011-06-01 NOTE — Progress Notes (Signed)
ANTICOAGULATION CONSULT NOTE - Initial Consult  Pharmacy Consult for heparin Indication: chest pain/ACS  Allergies  Allergen Reactions  . Iodides Hives   Vital Signs: Temp: 98.4 F (36.9 C) (04/12 2042) Temp src: Oral (04/12 2042) BP: 122/82 mmHg (04/12 2340) Pulse Rate: 67  (04/12 2340)  Labs:  [facility] 05/31/11 2158  HGB 14.9  HCT 42.8  PLT 225  APTT --  LABPROT --  INR --  HEPARINUNFRC --  CREATININE 1.00  CKTOTAL 257*  CKMB 4.1*  TROPONINI --   Medical History: Past Medical History  Diagnosis Date  . Hypertension     Assessment: 51yo male c/o palpitations and near-syncope with abnormal CE admitted for cardiac monitoring to begin heparin.  Goal of Therapy:  Heparin level 0.3-0.7 units/ml   Plan:  Will give heparin bolus of 4000 units followed by gtt at 1200 units/hr and monitor heparin levels and CBC.  Colleen Can PharmD BCPS 06/01/2011,2:05 AM

## 2011-06-01 NOTE — Progress Notes (Signed)
Pt. Discharged 06/01/2011  4:48 PM Discharge instructions reviewed with patient/family. Patient/family verbalized understanding. All Rx's given. Questions answered as needed. Pt. Discharged to home with family/self.  Annie Sable

## 2011-06-01 NOTE — Progress Notes (Addendum)
   Dr. Carlynn Spry consult note from this am reviewed. Labs and ECG reviewed. Patient currently undergoing stress test. Await results.  English Craighead,MD 10:34 AM  Stress test reviewed.  Walked nearly 11 minutes. No CP or ECG changes.  Myoview images EF 52% normal perfusion.   Results discussed at length with him and his wife. Ok for d/c today. If has recurrent events will need event monitor.   Will arrange f/u for 2-3 weeks  Echo pending.  Tyr Franca,MD 12:40 PM

## 2011-06-01 NOTE — ED Notes (Signed)
Pt reports that he does not wish to stay; MD made aware.

## 2011-06-01 NOTE — H&P (Signed)
PCP:  None Used to be amy Elisabeth Most   Chief Complaint:  Lightheadedness   HPI: Albert Griffin is a 51 y.o. male   has a past medical history of Hypertension.   Presented with  24 hour of recurrent episodes of presyncope and palpitations associated with chest pain, radiating to left arm  associated with nausea. No shortness of breath. Episodes were exertional and improved after stopping activity. He drinks about 4 cups of coffee a day.   Review of Systems:    Pertinent positives include: nausea, chest pain,  diarrhea,   Constitutional:  No weight loss, night sweats, Fevers, chills, fatigue, weight loss  HEENT:  No headaches, Difficulty swallowing,Tooth/dental problems,Sore throat,  No sneezing, itching, ear ache, nasal congestion, post nasal drip,  Cardio-vascular:  No Orthopnea, PND, anasarca, dizziness, palpitations.no Bilateral lower extremity swelling  GI:  No heartburn, indigestion, abdominal pain, vomiting,change in bowel habits, loss of appetite, melena, blood in stool, hematoemesis Resp:  no shortness of breath at rest. No dyspnea on exertion, No excess mucus, no productive cough, No non-productive cough, No coughing up of blood.No change in color of mucus.No wheezing. Skin:  no rash or lesions. No jaundice GU:  no dysuria, change in color of urine, no urgency or frequency. No straining to urinate.  No flank pain.  Musculoskeletal:  No joint pain or no joint swelling. No decreased range of motion. No back pain.  Psych:  No change in mood or affect. No depression or anxiety. No memory loss.  Neuro: no localizing neurological complaints, no tingling, no weakness, no double vision, no gait abnormality, no slurred speech, no confusion  Otherwise ROS are negative except for above, 10 systems were reviewed  Past Medical History: Past Medical History  Diagnosis Date  . Hypertension    Past Surgical History  Procedure Date  . Back surgery       Medications: Prior to Admission medications   Medication Sig Start Date End Date Taking? Authorizing Provider  calcium carbonate (OS-CAL) 600 MG TABS Take 600 mg by mouth 2 (two) times daily with a meal.   Yes Historical Provider, MD  cholecalciferol (VITAMIN D) 1000 UNITS tablet Take 1,000 Units by mouth daily.   Yes Historical Provider, MD  cyanocobalamin 500 MCG tablet Take 500 mcg by mouth daily.   Yes Historical Provider, MD  esomeprazole (NEXIUM) 40 MG capsule Take 40 mg by mouth daily before breakfast.   Yes Historical Provider, MD  fexofenadine (ALLEGRA) 180 MG tablet Take 180 mg by mouth daily.   Yes Historical Provider, MD  losartan (COZAAR) 50 MG tablet Take 50 mg by mouth daily.   Yes Historical Provider, MD  Multiple Vitamin (MULITIVITAMIN WITH MINERALS) TABS Take 1 tablet by mouth daily.   Yes Historical Provider, MD    Allergies:   Allergies  Allergen Reactions  . Iodides Hives    Social History:  Ambulatory  independently  Lives at home   reports that he has quit smoking. He has quit using smokeless tobacco. He reports that he drinks alcohol. He reports that he does not use illicit drugs.   Family History: family history includes Breast cancer in his mother; Esophageal cancer in his father; Heart disease in his maternal uncle and sister; and Lung cancer in his mother.    Physical Exam: Patient Vitals for the past 24 hrs:  BP Temp Temp src Pulse Resp SpO2  05/31/11 2340 122/82 mmHg - - 67  17  99 %  05/31/11 2042 132/76  mmHg 98.4 F (36.9 C) Oral 66  20  94 %    1. General:  in No Acute distress 2. Psychological: Alert and  Oriented 3. Head/ENT:   Moist Mucous Membranes                          Head Non traumatic, neck supple                          Normal Dentition 4. SKIN: normal  Skin turgor,  Skin clean Dry and intact no rash 5. Heart: Regular rate and rhythm no Murmur, Rub or gallop 6. Lungs: Clear to auscultation bilaterally, no wheezes or  crackles   7. Abdomen: Soft, non-tender, Non distended 8. Lower extremities: no clubbing, cyanosis, or edema 9. Neurologically Grossly intact, moving all 4 extremities equally 10. MSK: Normal range of motion  body mass index is unknown because there is no height or weight on file.   Labs on Admission:   [facility] 05/31/11 2158  NA 141  K 4.2  CL 104  CO2 27  GLUCOSE 112*  BUN 17  CREATININE 1.00  CALCIUM 9.7  MG --  PHOS --    Basename 05/31/11 2158  AST 27  ALT 24  ALKPHOS 74  BILITOT 0.6  PROT 7.2  ALBUMIN 4.3   No results found for this basename: LIPASE:2,AMYLASE:2 in the last 72 hours  Basename 05/31/11 2158  WBC 9.3  NEUTROABS 7.2  HGB 14.9  HCT 42.8  MCV 87.2  PLT 225    Basename 05/31/11 2158  CKTOTAL 257*  CKMB 4.1*  CKMBINDEX --  TROPONINI --   No results found for this basename: TSH,T4TOTAL,FREET3,T3FREE,THYROIDAB in the last 72 hours No results found for this basename: VITAMINB12:2,FOLATE:2,FERRITIN:2,TIBC:2,IRON:2,RETICCTPCT:2 in the last 72 hours No results found for this basename: HGBA1C    CrCl is unknown because there is no height on file for the current visit. ABG No results found for this basename: phart, pco2, po2, hco3, tco2, acidbasedef, o2sat     No results found for this basename: DDIMER     Other results:  I have pearsonaly reviewed this: ECG REPORT  Rate: 79  Rhythm: NSR ST&T Change: no sichemia    Cultures: No results found for this basename: sdes, specrequest, cult, reptstatus       Radiological Exams on Admission: Dg Chest Portable 1 View  06/01/2011  *RADIOLOGY REPORT*  Clinical Data: Near-syncope.  Hypertension.  CHEST - 1 VIEW  Comparison:  12/31/2008  Findings: The heart size and mediastinal contours are within normal limits.  Both lungs are clear.  IMPRESSION: No active disease.  Original Report Authenticated By: Danae Orleans, M.D.    Assessment/Plan  This is a 51 year old gentleman with chest pain  worse with a exertion seems to be accelerating in nature worrisome for unstable angina  Present on Admission:   .HTN (hypertension) .Chest pain - worrisome form unstable angina we'll admit him watching telemetry, will initiate heparin drip, have spoken to [facility] cardiology concerning patient he'll be seen tonight so far cardiac markers and negative. Will make sure patient is on aspirin. If cardiology is deems necessary Plavix can be added  .Light headedness - obtain echo gram watching telemetry for any evidence of dysrhythmia cycle cardiac markers serial EKGs   Prophylaxis: Heparin Protonix  CODE STATUS: Full code  I have spent a total of 65 min on this admisstion, which time was  spent on talking to the family and consultants  Ivannah Zody 06/01/2011, 1:16 AM

## 2011-06-02 NOTE — Progress Notes (Signed)
Patient seen and examined with Tereso Newcomer PA-C. We discussed all aspects of the encounter. I agree with the assessment and plan as stated above.

## 2011-06-03 NOTE — Progress Notes (Signed)
Utilization Review Completed.Jemmie Ledgerwood T4/15/2013   

## 2011-06-19 ENCOUNTER — Encounter: Payer: Self-pay | Admitting: Gastroenterology

## 2011-06-19 ENCOUNTER — Encounter: Payer: Self-pay | Admitting: *Deleted

## 2011-06-19 ENCOUNTER — Encounter: Payer: Self-pay | Admitting: Cardiovascular Disease

## 2011-06-20 ENCOUNTER — Ambulatory Visit (INDEPENDENT_AMBULATORY_CARE_PROVIDER_SITE_OTHER): Payer: BC Managed Care – PPO | Admitting: Cardiovascular Disease

## 2011-06-20 ENCOUNTER — Encounter: Payer: Self-pay | Admitting: Cardiovascular Disease

## 2011-06-20 VITALS — BP 129/84 | HR 66 | Wt 233.0 lb

## 2011-06-20 DIAGNOSIS — R079 Chest pain, unspecified: Secondary | ICD-10-CM

## 2011-06-20 DIAGNOSIS — I10 Essential (primary) hypertension: Secondary | ICD-10-CM

## 2011-06-20 DIAGNOSIS — R002 Palpitations: Secondary | ICD-10-CM

## 2011-06-20 NOTE — Patient Instructions (Signed)
Your physician recommends that you schedule a follow-up appointment in: AS NEEDED  Your physician recommends that you continue on your current medications as directed. Please refer to the Current Medication list given to you today.  

## 2011-06-20 NOTE — Assessment & Plan Note (Signed)
Atypical with normal echo and myovue observe

## 2011-06-20 NOTE — Progress Notes (Signed)
Patient ID: Albert Griffin, male   DOB: 1960/12/01, 51 y.o.   MRN: [id number] 51 yo of Dr Teressa Lower.  Just D/C from hospital with atypical chest pain and palpitations.  R/O  Telemetry normal.  Echo normal and stress myovue normal with EF 52% No further episodes since D/C.  Seeing Amy Stevenson's PA Melissa.  Compliant with BP meds now .    ROS: Denies fever, malais, weight loss, blurry vision, decreased visual acuity, cough, sputum, SOB, hemoptysis, pleuritic pain, palpitaitons, heartburn, abdominal pain, melena, lower extremity edema, claudication, or rash.  All other systems reviewed and negative  General: Affect appropriate Healthy:  appears stated age HEENT: normal Neck supple with no adenopathy JVP normal no bruits no thyromegaly Lungs clear with no wheezing and good diaphragmatic motion Heart:  S1/S2 no murmur, no rub, gallop or click PMI normal Abdomen: benighn, BS positve, no tenderness, no AAA no bruit.  No HSM or HJR Distal pulses intact with no bruits No edema Neuro non-focal Skin warm and dry No muscular weakness   Current Outpatient Prescriptions  Medication Sig Dispense Refill  . cyanocobalamin 500 MCG tablet Take 500 mcg by mouth daily.      Marland Kitchen esomeprazole (NEXIUM) 40 MG capsule Take 40 mg by mouth daily before breakfast.      . fexofenadine (ALLEGRA) 180 MG tablet Take 180 mg by mouth daily.      Marland Kitchen losartan (COZAAR) 50 MG tablet Take 50 mg by mouth daily.      . nitroGLYCERIN (NITROSTAT) 0.4 MG SL tablet Place 1 tablet (0.4 mg total) under the tongue every 5 (five) minutes as needed for chest pain.  60 tablet  3  . VITAMIN E PO Take 1 tablet by mouth daily.        Allergies  Iodides  Electrocardiogram:  NSR normal ECG 06/02/11  Assessment and Plan

## 2011-06-20 NOTE — Assessment & Plan Note (Signed)
Well controlled.  Continue current medications and low sodium Dash type diet.   Continue Cozaar

## 2011-06-20 NOTE — Assessment & Plan Note (Signed)
Benign nonrecurrent.  Dr Margie Billet note indicated only get event monitor if palpitations persist and they have not

## 2011-06-24 ENCOUNTER — Encounter: Payer: Self-pay | Admitting: Cardiovascular Disease

## 2011-06-24 ENCOUNTER — Telehealth: Payer: Self-pay | Admitting: Cardiovascular Disease

## 2011-06-24 NOTE — Telephone Encounter (Signed)
PT AWARE CORRECTED WEIGHT IN  RECORD AND WILL ALSO FORWARD  MESSAGE TO DR Eden Emms   FOR REVIEW RE SISTER./CY

## 2011-06-24 NOTE — Telephone Encounter (Signed)
Patient calling in to report information:  Patient said he forgot to tell Dr. Eden Emms sister received a pacemaker 8 months ago due sick sinus syndrome. Also patient said he was weighed at 273 when his actual weight is only 233.  Please look into this as patient doesn't think his insurance would like that.  Patient can be reached at [phone] should you need to speak with him.

## 2011-07-05 ENCOUNTER — Encounter: Payer: Self-pay | Admitting: Gastroenterology

## 2011-07-05 ENCOUNTER — Ambulatory Visit (INDEPENDENT_AMBULATORY_CARE_PROVIDER_SITE_OTHER): Payer: BC Managed Care – PPO | Admitting: Gastroenterology

## 2011-07-05 ENCOUNTER — Other Ambulatory Visit (INDEPENDENT_AMBULATORY_CARE_PROVIDER_SITE_OTHER): Payer: BC Managed Care – PPO

## 2011-07-05 VITALS — BP 118/72 | HR 78 | Ht 68.0 in | Wt 239.2 lb

## 2011-07-05 DIAGNOSIS — K625 Hemorrhage of anus and rectum: Secondary | ICD-10-CM

## 2011-07-05 DIAGNOSIS — K219 Gastro-esophageal reflux disease without esophagitis: Secondary | ICD-10-CM

## 2011-07-05 DIAGNOSIS — K602 Anal fissure, unspecified: Secondary | ICD-10-CM

## 2011-07-05 DIAGNOSIS — K5900 Constipation, unspecified: Secondary | ICD-10-CM

## 2011-07-05 LAB — IBC PANEL
Iron: 74 ug/dL (ref 42–165)
Transferrin: 224.5 mg/dL (ref 212.0–360.0)

## 2011-07-05 MED ORDER — MOVIPREP 100 G PO SOLR: 1.0000 | Freq: Once | ORAL | Status: DC

## 2011-07-05 MED ORDER — PRAMOXINE-HC 1-1 % EX CREA: TOPICAL_CREAM | CUTANEOUS | Status: AC

## 2011-07-05 NOTE — Progress Notes (Signed)
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 He's tried fiber supplements he calls more bloating and gas. He has not had previous colonoscopy or barium enemas. The patient also describes over 20 years of acid reflux with daily PPI use. He has not had previous barium swallow or endoscopy. His medical history is complicated by multiple back surgeries and a chronic cardiomyopathy followed by cardiology. He is on when necessary nitroglycerin, Cozaar 50 mg a day, and Flexeril 10 mg 3 times a day with a variety of multivitamins. The patient denies any hepatobiliary complaints, dysphagia, but has had a 25 pound weight loss over the last year voluntarily to help his joint pains. Recent lab review shows normal CBC and metabolic profile. He does not smoke or abuse ethanol. Also there is no history of previous hepatitis or pancreatitis.  I have reviewed this patient's present history, medical and surgical past history, allergies and medications.     ROS: The remainder of the 10 point ROS is negative... 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 His hypertension is well controlled on medications. He denies any active cardio pulmonary complaints at this time.  Allergies  Allergen Reactions  . Iodides Hives   Outpatient Prescriptions Prior to Visit  Medication Sig Dispense Refill  . cyanocobalamin 500 MCG tablet Take 500 mcg by mouth daily.      Marland Kitchen esomeprazole (NEXIUM) 40 MG capsule Take 40 mg by mouth daily before breakfast.      . fexofenadine (ALLEGRA) 180 MG tablet Take 180 mg by mouth daily.      Marland Kitchen losartan (COZAAR) 50 MG tablet Take 50 mg by mouth daily.       . nitroGLYCERIN (NITROSTAT) 0.4 MG SL tablet Place 1 tablet (0.4 mg total) under the tongue every 5 (five) minutes as needed for chest pain.  60 tablet  3  . VITAMIN E PO Take 1 tablet by mouth daily.       Past Medical History  Diagnosis Date  . Hypertension   . Obesity   . Chest pain   . Tendonitis   . GERD (gastroesophageal reflux disease)   . Vitamin B12 deficiency    Past Surgical History  Procedure Date  . Back surgery    History   Social History  . Marital Status: Married    Spouse Name: N/A    Number of Children: 2  . Years of Education: N/A   Occupational History  .     Social History Main Topics  . Smoking status: Former Games developer  . Smokeless tobacco: Former Neurosurgeon  . Alcohol Use: Yes     occasional, nothing recent  . Drug Use: No  . Sexually Active:    Other Topics Concern  . None   Social History Narrative  . None   Family History  Problem Relation Age of Onset  . Lung cancer Mother   . Breast cancer Mother   . Esophageal cancer Father   . Heart disease Sister   . Heart disease Maternal Uncle         Physical Exam: Healthy-appearing patient in no distress. Blood pressure 118/72, pulse 78 and regular, weight 239 pounds, and BMI of 36.37. General well developed  well nourished patient in no acute distress, appearing their stated age Eyes PERRLA, no icterus, fundoscopic exam per opthamologist Skin no lesions noted Neck supple, no adenopathy, no thyroid enlargement, no tenderness Chest clear to percussion and auscultation Heart no significant murmurs, gallops or rubs noted Abdomen no hepatosplenomegaly masses or tenderness, BS normal.  Rectal inspection normal no fissures, or fistulae noted.  No masses or tenderness on digital exam. Stool guaiac negative. Prominent posterior fissure noted. His prostate is somewhat firm not non-nodular, and the size appears normal. Extremities no acute joint lesions, edema, phlebitis or evidence of  cellulitis. Neurologic patient oriented x 3, cranial nerves intact, no focal neurologic deficits noted. Psychological mental status normal and normal affect.  Assessment and plan: This patient has chronic GERD and is a prototype for Barrett's mucosa. I have scheduled him for endoscopy and dilations if indicated, reviewed her reflux regime with him, and we'll continue his daily PPI therapy. His constipation is of concern that his age, and I have placed him on daily Metamucil, high fiber diet, and liberal by mouth fluids. Colonoscopy also has been scheduled with propofol sedation to exclude colonic polyposis or carcinoma. Recent myocardial imaging showed 54% ejection fraction with no evidence of ischemia. He is followed in cardiology by Dr. Charlton Haws. We have provided Analpram cream twice a day likely to his anal fissure. 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  Please copy her primary care physician, referring physician, and pertinent subspecialists.  Encounter Diagnoses  Name Primary?  . Esophageal reflux Yes  . Constipation   . Rectal bleeding   . Rectal fissure

## 2011-07-05 NOTE — Patient Instructions (Addendum)
Your procedure has been scheduled for 07/10/2011, please follow the seperate instructions.  Please go to the basement today for your labs.  Use Analpram twice a day. Continue Nexium. Follow a High Fiber Diet But Metamucil OTC and take it once a day.  If you have questions about any billing with [facility] or  you should call [phone].

## 2011-07-08 LAB — CELIAC PANEL 10
Endomysial Screen: NEGATIVE
IgA: 213 mg/dL (ref 68–379)

## 2011-07-10 ENCOUNTER — Encounter: Payer: Self-pay | Admitting: Gastroenterology

## 2011-07-10 ENCOUNTER — Ambulatory Visit (AMBULATORY_SURGERY_CENTER): Payer: BC Managed Care – PPO | Admitting: Gastroenterology

## 2011-07-10 VITALS — BP 115/57 | HR 66 | Temp 98.7°F | Resp 20 | Ht 68.0 in | Wt 239.0 lb

## 2011-07-10 DIAGNOSIS — K5900 Constipation, unspecified: Secondary | ICD-10-CM

## 2011-07-10 DIAGNOSIS — Z1211 Encounter for screening for malignant neoplasm of colon: Secondary | ICD-10-CM

## 2011-07-10 DIAGNOSIS — K219 Gastro-esophageal reflux disease without esophagitis: Secondary | ICD-10-CM

## 2011-07-10 DIAGNOSIS — K602 Anal fissure, unspecified: Secondary | ICD-10-CM

## 2011-07-10 DIAGNOSIS — K625 Hemorrhage of anus and rectum: Secondary | ICD-10-CM

## 2011-07-10 MED ORDER — SODIUM CHLORIDE 0.9 % IV SOLN: 500.0000 mL | INTRAVENOUS | Status: DC

## 2011-07-10 NOTE — Op Note (Signed)
[facility] [address] [place], [place]  [address]  COLONOSCOPY PROCEDURE REPORT  PATIENT:  Albert Griffin, Albert Griffin  MR#:  [id number] BIRTHDATE:  1960-05-14, 51 yrs. old  GENDER:  male ENDOSCOPIST:  Vania Rea. Jarold Motto, MD, [facility] REF. BY: PROCEDURE DATE:  07/10/2011 PROCEDURE:  Average-risk screening colonoscopy G0121 ASA CLASS:  Class II INDICATIONS:  Routine Risk Screening MEDICATIONS:   propofol (Diprivan) 300 mg IV  DESCRIPTION OF PROCEDURE:   After the risks and benefits and of the procedure were explained, informed consent was obtained. Digital rectal exam was performed and revealed no abnormalities. The LB [id number]-[id number] endoscope was introduced through the anus and advanced to the cecum, which was identified by both the appendix and ileocecal valve.  The quality of the prep was excellent, using MoviPrep.  The instrument was then slowly withdrawn as the colon was fully examined. <<PROCEDUREIMAGES>>  FINDINGS:  No polyps or cancers were seen.  This was otherwise a normal examination of the colon.   Retroflexed views in the rectum revealed no abnormalities.    The scope was then withdrawn from the patient and the procedure completed.  COMPLICATIONS:  None ENDOSCOPIC IMPRESSION: 1) No polyps or cancers 2) Otherwise normal examination RECOMMENDATIONS: 1) Continue current colorectal screening recommendations for "routine risk" patients with a repeat colonoscopy in 10 years.  REPEAT EXAM:  No  ______________________________ Vania Rea. Jarold Motto, MD, [facility]  CC:  Loree Fee PA  n. eSIGNED:   Vania Rea. Demonica Farrey at 07/10/2011 04:17 PM  Martin Majestic, [phone]

## 2011-07-10 NOTE — Op Note (Signed)
[facility] [address] [place], [place]  [address]  ENDOSCOPY PROCEDURE REPORT  PATIENT:  Ferlin, Fairhurst  MR#:  [id number] BIRTHDATE:  Mar 28, 1960, 51 yrs. old  GENDER:  male  ENDOSCOPIST:  Vania Rea. Jarold Motto, MD, [facility] Referred by:  PROCEDURE DATE:  07/10/2011 PROCEDURE:  EGD with biopsy for H. pylori 19147 ASA CLASS:  Class II INDICATIONS:  GERD  MEDICATIONS:   There was residual sedation effect present from prior procedure., propofol (Diprivan) 100 mg IV TOPICAL ANESTHETIC:  DESCRIPTION OF PROCEDURE:   After the risks and benefits of the procedure were explained, informed consent was obtained.  The LB GIF-H180 [id number] endoscope was introduced through the mouth and advanced to the second portion of the duodenum.  The instrument was slowly withdrawn as the mucosa was fully examined. <<PROCEDUREIMAGES>>  The upper, middle, and distal third of the esophagus were carefully inspected and no abnormalities were noted. The z-line was well seen at the GEJ. The endoscope was pushed into the fundus which was normal including a retroflexed view. The antrum,gastric body, first and second part of the duodenum were unremarkable. LARGE FUNDAL RUGAE,CLO BX. DONE.NO BARRETT'S MUCOSA NOTED. Retroflexed views revealed no abnormalities.    The scope was then withdrawn from the patient and the procedure completed.  COMPLICATIONS:  None  ENDOSCOPIC IMPRESSION: 1) Normal EGD CHRONIC GERD ON RX.PROBABLE HYPERACIDITY. RECOMMENDATIONS: 1) Await biopsy results 2) Rx CLO if positive 3) resume previous medications  ______________________________ Vania Rea. Jarold Motto, MD, [facility]  CC:  Loree Fee PA  n. eSIGNED:   Vania Rea. My Madariaga at 07/10/2011 04:22 PM  Martin Majestic, [phone]

## 2011-07-10 NOTE — Progress Notes (Signed)
Propofol given and oxygen managed per D Merritt CRNA 

## 2011-07-10 NOTE — Patient Instructions (Signed)
Impressions/recommendations:  Normal colonoscopy. Repeat in 10 years.  Normal EGD.  Resume medications as you were taking them prior to your procedure.  YOU HAD AN ENDOSCOPIC PROCEDURE TODAY AT THE [facility]: Refer to the procedure report that was given to you for any specific questions about what was found during the examination.  If the procedure report does not answer your questions, please call your gastroenterologist to clarify.  If you requested that your care partner not be given the details of your procedure findings, then the procedure report has been included in a sealed envelope for you to review at your convenience later.  YOU SHOULD EXPECT: Some feelings of bloating in the abdomen. Passage of more gas than usual.  Walking can help get rid of the air that was put into your GI tract during the procedure and reduce the bloating. If you had a lower endoscopy (such as a colonoscopy or flexible sigmoidoscopy) you may notice spotting of blood in your stool or on the toilet paper. If you underwent a bowel prep for your procedure, then you may not have a normal bowel movement for a few days.  DIET: Your first meal following the procedure should be a light meal and then it is ok to progress to your normal diet.  A half-sandwich or bowl of soup is an example of a good first meal.  Heavy or fried foods are harder to digest and may make you feel nauseous or bloated.  Likewise meals heavy in dairy and vegetables can cause extra gas to form and this can also increase the bloating.  Drink plenty of fluids but you should avoid alcoholic beverages for 24 hours.  ACTIVITY: Your care partner should take you home directly after the procedure.  You should plan to take it easy, moving slowly for the rest of the day.  You can resume normal activity the day after the procedure however you should NOT DRIVE or use heavy machinery for 24 hours (because of the sedation medicines used during the test).      SYMPTOMS TO REPORT IMMEDIATELY: A gastroenterologist can be reached at any hour.  During normal business hours, 8:30 AM to 5:00 PM Monday through Friday, call [phone].  After hours and on weekends, please call the GI answering service at [phone] who will take a message and have the physician on call contact you.   Following lower endoscopy (colonoscopy or flexible sigmoidoscopy):  Excessive amounts of blood in the stool  Significant tenderness or worsening of abdominal pains  Swelling of the abdomen that is new, acute  Fever of 100F or higher  Following upper endoscopy (EGD)  Vomiting of blood or coffee ground material  New chest pain or pain under the shoulder blades  Painful or persistently difficult swallowing  New shortness of breath  Fever of 100F or higher  Black, tarry-looking stools  FOLLOW UP: If any biopsies were taken you will be contacted by phone or by letter within the next 1-3 weeks.  Call your gastroenterologist if you have not heard about the biopsies in 3 weeks.  Our staff will call the home number listed on your records the next business day following your procedure to check on you and address any questions or concerns that you may have at that time regarding the information given to you following your procedure. This is a courtesy call and so if there is no answer at the home number and we have not heard from you through  the emergency physician on call, we will assume that you have returned to your regular daily activities without incident.  SIGNATURES/CONFIDENTIALITY: You and/or your care partner have signed paperwork which will be entered into your electronic medical record.  These signatures attest to the fact that that the information above on your After Visit Summary has been reviewed and is understood.  Full responsibility of the confidentiality of this discharge information lies with you and/or your care-partner.

## 2011-07-10 NOTE — Progress Notes (Signed)
Patient did not experience any of the following events: a burn prior to discharge; a fall within the facility; wrong site/side/patient/procedure/implant event; or a hospital transfer or hospital admission upon discharge from the facility. (G8907) Patient did not have preoperative order for IV antibiotic SSI prophylaxis. (G8918)  

## 2011-07-11 ENCOUNTER — Telehealth: Payer: Self-pay

## 2011-07-11 NOTE — Telephone Encounter (Signed)
Left message on answering machine. 

## 2011-07-12 ENCOUNTER — Encounter: Payer: Self-pay | Admitting: Gastroenterology

## 2012-12-23 ENCOUNTER — Telehealth: Payer: Self-pay | Admitting: Internal Medicine

## 2012-12-23 MED ORDER — ESOMEPRAZOLE MAGNESIUM 40 MG PO CPDR: 40.0000 mg | DELAYED_RELEASE_CAPSULE | Freq: Every day | ORAL | Status: DC

## 2012-12-23 NOTE — Telephone Encounter (Signed)
FAX REFILL ON NEXIUM 40MG  QTY 90

## 2012-12-24 ENCOUNTER — Telehealth: Payer: Self-pay | Admitting: Internal Medicine

## 2012-12-24 NOTE — Telephone Encounter (Signed)
[facility] [address] [phone] faxed Prior Authorization for Nexium 40 mg, not a refill request at this time.  I have initiated Prior Authorization through [facility] [facility] and awaiting their response at this time.

## 2012-12-24 NOTE — Telephone Encounter (Signed)
Fax refill for nexium 40mg  qty 90

## 2012-12-25 NOTE — Telephone Encounter (Signed)
Received denial from [employer] [facility] on Prior Authorization for Nexium Rx.  Per denial, Prior Authorization cannot be performed for products excluded under plan.  Advised pt must contact Member Services by calling number on their insurance card for further instructions.  Spoke with pt about denial, he will contact Member services or try OTC to see if any relief with symptoms.

## 2013-03-01 ENCOUNTER — Other Ambulatory Visit: Payer: Self-pay | Admitting: Internal Medicine

## 2013-07-19 ENCOUNTER — Other Ambulatory Visit: Payer: Self-pay | Admitting: Physician Assistant

## 2013-07-30 ENCOUNTER — Encounter: Payer: Self-pay | Admitting: Emergency Medicine

## 2013-07-30 ENCOUNTER — Ambulatory Visit (INDEPENDENT_AMBULATORY_CARE_PROVIDER_SITE_OTHER): Payer: 59 | Admitting: Emergency Medicine

## 2013-07-30 VITALS — BP 140/80 | HR 76 | Temp 97.9°F | Resp 16 | Ht 69.5 in | Wt 257.0 lb

## 2013-07-30 DIAGNOSIS — R5381 Other malaise: Secondary | ICD-10-CM

## 2013-07-30 DIAGNOSIS — R1013 Epigastric pain: Secondary | ICD-10-CM

## 2013-07-30 DIAGNOSIS — G8929 Other chronic pain: Secondary | ICD-10-CM

## 2013-07-30 DIAGNOSIS — K219 Gastro-esophageal reflux disease without esophagitis: Secondary | ICD-10-CM

## 2013-07-30 DIAGNOSIS — R7309 Other abnormal glucose: Secondary | ICD-10-CM

## 2013-07-30 DIAGNOSIS — R5383 Other fatigue: Secondary | ICD-10-CM

## 2013-07-30 DIAGNOSIS — R079 Chest pain, unspecified: Secondary | ICD-10-CM

## 2013-07-30 LAB — CBC WITH DIFFERENTIAL/PLATELET
BASOS PCT: 1 % (ref 0–1)
Basophils Absolute: 0.1 10*3/uL (ref 0.0–0.1)
EOS PCT: 2 % (ref 0–5)
Eosinophils Absolute: 0.1 10*3/uL (ref 0.0–0.7)
HCT: 42.5 % (ref 39.0–52.0)
Hemoglobin: 14.9 g/dL (ref 13.0–17.0)
LYMPHS ABS: 1.8 10*3/uL (ref 0.7–4.0)
Lymphocytes Relative: 27 % (ref 12–46)
MCH: 29.7 pg (ref 26.0–34.0)
MCHC: 35.1 g/dL (ref 30.0–36.0)
MCV: 84.8 fL (ref 78.0–100.0)
Monocytes Absolute: 0.5 10*3/uL (ref 0.1–1.0)
Monocytes Relative: 8 % (ref 3–12)
Neutro Abs: 4.2 10*3/uL (ref 1.7–7.7)
Neutrophils Relative %: 62 % (ref 43–77)
Platelets: 246 10*3/uL (ref 150–400)
RBC: 5.01 MIL/uL (ref 4.22–5.81)
RDW: 13.9 % (ref 11.5–15.5)
WBC: 6.7 10*3/uL (ref 4.0–10.5)

## 2013-07-30 LAB — HEMOGLOBIN A1C
Hgb A1c MFr Bld: 6.1 % — ABNORMAL HIGH (ref <5.7)
Mean Plasma Glucose: 128 mg/dL — ABNORMAL HIGH (ref <117)

## 2013-07-30 NOTE — Patient Instructions (Signed)
Chest Pain (Nonspecific) Chest pain has many causes. Your pain could be caused by something serious, such as a heart attack or a blood clot in the lungs. It could also be caused by something less serious, such as a chest bruise or a virus. Follow up with your doctor. More lab tests or other studies may be needed to find the cause of your pain. Most of the time, nonspecific chest pain will improve within 2 to 3 days of rest and mild pain medicine. HOME CARE  For chest bruises, you may put ice on the sore area for 15-20 minutes, 03-04 times a day. Do this only if it makes you feel better.  Put ice in a plastic bag.  Place a towel between the skin and the bag.  Rest for the next 2 to 3 days.  Go back to work if the pain improves.  See your doctor if the pain lasts longer than 1 to 2 weeks.  Only take medicine as told by your doctor.  Quit smoking if you smoke. GET HELP RIGHT AWAY IF:   There is more pain or pain that spreads to the arm, neck, jaw, back, or belly (abdomen).  You have shortness of breath.  You cough more than usual or cough up blood.  You have very bad back or belly pain, feel sick to your stomach (nauseous), or throw up (vomit).  You have very bad weakness.  You pass out (faint).  You have a fever. Any of these problems may be serious and may be an emergency. Do not wait to see if the problems will go away. Get medical help right away. Call your local emergency services 911 in [place] Do not drive yourself to the hospital. MAKE SURE YOU:   Understand these instructions.  Will watch this condition.  Will get help right away if you or your child is not doing well or gets worse. Document Released: 07/24/2007 Document Revised: 04/29/2011 Document Reviewed: 07/24/2007 [facility] Patient Information 2014 [place], [place]. Diet for Gastroesophageal Reflux Disease, Adult Reflux (acid reflux) is when acid from your stomach flows up into the esophagus. When acid comes in  contact with the esophagus, the acid causes irritation and soreness (inflammation) in the esophagus. When reflux happens often or so severely that it causes damage to the esophagus, it is called gastroesophageal reflux disease (GERD). Nutrition therapy can help ease the discomfort of GERD. FOODS OR DRINKS TO AVOID OR LIMIT  Smoking or chewing tobacco. Nicotine is one of the most potent stimulants to acid production in the gastrointestinal tract.  Caffeinated and decaffeinated coffee and black tea.  Regular or low-calorie carbonated beverages or energy drinks (caffeine-free carbonated beverages are allowed).   Strong spices, such as black pepper, white pepper, red pepper, cayenne, curry powder, and chili powder.  Peppermint or spearmint.  Chocolate.  High-fat foods, including meats and fried foods. Extra added fats including oils, butter, salad dressings, and nuts. Limit these to less than 8 tsp per day.  Fruits and vegetables if they are not tolerated, such as citrus fruits or tomatoes.  Alcohol.  Any food that seems to aggravate your condition. If you have questions regarding your diet, call your caregiver or a registered dietitian. OTHER THINGS THAT MAY HELP GERD INCLUDE:   Eating your meals slowly, in a relaxed setting.  Eating 5 to 6 small meals per day instead of 3 large meals.  Eliminating food for a period of time if it causes distress.  Not lying down  until 3 hours after eating a meal.  Keeping the head of your bed raised 6 to 9 inches (15 to 23 cm) by using a foam wedge or blocks under the legs of the bed. Lying flat may make symptoms worse.  Being physically active. Weight loss may be helpful in reducing reflux in overweight or obese adults.  Wear loose fitting clothing EXAMPLE MEAL PLAN This meal plan is approximately 2,000 calories based on [email or web address] meal planning guidelines. Breakfast   cup cooked oatmeal.  1 cup strawberries.  1 cup low-fat  milk.  1 oz almonds. Snack  1 cup cucumber slices.  6 oz yogurt (made from low-fat or fat-free milk). Lunch  2 slice whole-wheat bread.  2 oz sliced [place].  2 tsp mayonnaise.  1 cup blueberries.  1 cup snap peas. Snack  6 whole-wheat crackers.  1 oz string cheese. Dinner   cup brown rice.  1 cup mixed veggies.  1 tsp olive oil.  3 oz grilled fish. Document Released: 02/04/2005 Document Revised: 04/29/2011 Document Reviewed: 12/21/2010 [facility] Patient Information 2014 [place], [place].

## 2013-07-30 NOTE — Progress Notes (Signed)
Subjective:    Patient ID: Albert Griffin, male    DOB: 1960-03-26, 53 y.o.   MRN: [id number]  HPI Comments: 53 yo NON compliant male patient comes in with epigastric pain. He d/c Nexium in January due to cost. He is trying to manage with OTC nexium without relief. He is under increased stress with work and is drinking a little more ETOH. He notes mild SOB and chest tightness with climbing stairs. He saw DR Johnsie Cancel 2013 with negative workup.   EGD/ Colonoscopy 06/2011 Dr Sharlett Iles WNL.   WBC             7.2   06/01/2011 HGB            13.5   06/01/2011 HCT            39.6   06/01/2011 PLT             184   06/01/2011 GLUCOSE          94   06/01/2011 CHOL            143   06/01/2011 TRIG             36   06/01/2011 HDL              52   06/01/2011 LDLCALC          84   06/01/2011 ALT              19   06/01/2011 AST              19   06/01/2011 NA              144   06/01/2011 K               3.6   06/01/2011 CL              109   06/01/2011 CREATININE     0.91   06/01/2011 BUN              16   06/01/2011 CO2              25   06/01/2011 TSH           1.565   06/01/2011 HGBA1C          5.6   06/01/2011     Medication List       This list is accurate as of: 07/30/13 11:39 AM.  Always use your most recent med list.               fexofenadine 180 MG tablet  Commonly known as:  ALLEGRA  Take 180 mg by mouth daily.     losartan 50 MG tablet  Commonly known as:  COZAAR  TAKE 1 TABLET BY MOUTH EVERY DAY     nitroGLYCERIN 0.4 MG SL tablet  Commonly known as:  NITROSTAT  Place 1 tablet (0.4 mg total) under the tongue every 5 (five) minutes as needed for chest pain.     omeprazole 40 MG capsule  Commonly known as:  PRILOSEC  Take 40 mg by mouth 2 (two) times daily.     TURMERIC PO  Take 500 mg by mouth daily.     VITAMIN E PO  Take 1 tablet by mouth daily.       Allergies  Allergen Reactions  . Iodides Hives   Past Medical History  Diagnosis Date  . Hypertension   .  Obesity    . Chest pain   . Tendonitis   . GERD (gastroesophageal reflux disease)   . Vitamin B12 deficiency       Review of Systems  Constitutional: Positive for fatigue.  Respiratory: Positive for chest tightness and shortness of breath.   Gastrointestinal: Positive for abdominal pain.  All other systems reviewed and are negative.  BP 140/80  Pulse 76  Temp(Src) 97.9 F (36.6 C)  Resp 16  Ht 5' 9.5" (1.765 m)  Wt 257 lb (116.574 kg)  BMI 37.42 kg/m2     Objective:   Physical Exam  Nursing note and vitals reviewed. Constitutional: He is oriented to person, place, and time. He appears well-developed and well-nourished.  obese  HENT:  Head: Normocephalic and atraumatic.  Right Ear: External ear normal.  Left Ear: External ear normal.  Nose: Nose normal.  Eyes: Conjunctivae and EOM are normal.  Neck: Normal range of motion. Neck supple. No JVD present. No thyromegaly present.  Cardiovascular: Normal rate, regular rhythm, normal heart sounds and intact distal pulses.   Pulmonary/Chest: Effort normal and breath sounds normal.  Abdominal: Soft. Bowel sounds are normal. He exhibits no distension and no mass. There is no tenderness. There is no rebound and no guarding.  Musculoskeletal: Normal range of motion. He exhibits no edema and no tenderness.  Lymphadenopathy:    He has no cervical adenopathy.  Neurological: He is alert and oriented to person, place, and time. He has normal reflexes. No cranial nerve deficit. Coordination normal.  Skin: Skin is warm and dry.  Psychiatric: He has a normal mood and affect. His behavior is normal. Judgment and thought content normal.      EKG NSCSPT WNL      Assessment & Plan:  1.  ? Epigastric vs chest/abdominal pain/ GERD- Diet/ hygiene discussed, May try OTC Nexium and call with results Dexilant 60 mg 1 QD SX 15, w/c if SX increase or ER. Needs re-eval Cardio. Refer GI if no improvement. Patient declines currently with insurance  issues  2. Fatigue- check labs, increase activity and H2O

## 2013-07-31 LAB — BASIC METABOLIC PANEL WITH GFR
BUN: 14 mg/dL (ref 6–23)
CALCIUM: 9.4 mg/dL (ref 8.4–10.5)
CO2: 25 mEq/L (ref 19–32)
CREATININE: 1.02 mg/dL (ref 0.50–1.35)
Chloride: 103 mEq/L (ref 96–112)
GFR, Est Non African American: 85 mL/min
GLUCOSE: 90 mg/dL (ref 70–99)
Potassium: 4.4 mEq/L (ref 3.5–5.3)
Sodium: 139 mEq/L (ref 135–145)

## 2013-07-31 LAB — HEPATIC FUNCTION PANEL
ALT: 31 U/L (ref 0–53)
AST: 25 U/L (ref 0–37)
Albumin: 4.6 g/dL (ref 3.5–5.2)
Alkaline Phosphatase: 66 U/L (ref 39–117)
BILIRUBIN INDIRECT: 0.8 mg/dL (ref 0.2–1.2)
BILIRUBIN TOTAL: 1 mg/dL (ref 0.2–1.2)
Bilirubin, Direct: 0.2 mg/dL (ref 0.0–0.3)
Total Protein: 6.7 g/dL (ref 6.0–8.3)

## 2013-07-31 LAB — LIPID PANEL
Cholesterol: 165 mg/dL (ref 0–200)
HDL: 49 mg/dL (ref >39)
LDL Cholesterol: 90 mg/dL (ref 0–99)
TRIGLYCERIDES: 130 mg/dL (ref <150)
Total CHOL/HDL Ratio: 3.4 Ratio
VLDL: 26 mg/dL (ref 0–40)

## 2013-07-31 LAB — TSH: TSH: 2.328 u[IU]/mL (ref 0.350–4.500)

## 2013-07-31 LAB — C-REACTIVE PROTEIN: CRP: 0.6 mg/dL — ABNORMAL HIGH (ref <0.60)

## 2013-07-31 LAB — INSULIN, FASTING: INSULIN FASTING, SERUM: 16 u[IU]/mL (ref 3–28)

## 2013-08-01 ENCOUNTER — Encounter: Payer: Self-pay | Admitting: Emergency Medicine

## 2013-08-01 DIAGNOSIS — K219 Gastro-esophageal reflux disease without esophagitis: Secondary | ICD-10-CM | POA: Insufficient documentation

## 2013-08-01 HISTORY — DX: Gastro-esophageal reflux disease without esophagitis: K21.9

## 2013-08-02 ENCOUNTER — Other Ambulatory Visit: Payer: Self-pay

## 2013-08-02 DIAGNOSIS — R7982 Elevated C-reactive protein (CRP): Secondary | ICD-10-CM

## 2013-08-03 ENCOUNTER — Other Ambulatory Visit: Payer: Self-pay | Admitting: Gastroenterology

## 2013-08-13 ENCOUNTER — Telehealth: Payer: Self-pay

## 2013-08-13 ENCOUNTER — Other Ambulatory Visit: Payer: Self-pay | Admitting: Emergency Medicine

## 2013-08-13 MED ORDER — DEXLANSOPRAZOLE 60 MG PO CPDR: 60.0000 mg | DELAYED_RELEASE_CAPSULE | Freq: Every day | ORAL | Status: DC

## 2013-08-13 NOTE — Telephone Encounter (Signed)
Patient said GERD samples of Dexilant 60mg  are working great. Patient is requesting Rx. Please advise. [facility] [address].

## 2013-10-01 ENCOUNTER — Ambulatory Visit (INDEPENDENT_AMBULATORY_CARE_PROVIDER_SITE_OTHER): Payer: 59 | Admitting: Cardiovascular Disease

## 2013-10-01 ENCOUNTER — Ambulatory Visit (INDEPENDENT_AMBULATORY_CARE_PROVIDER_SITE_OTHER)
Admission: RE | Admit: 2013-10-01 | Discharge: 2013-10-01 | Disposition: A | Discharge status: Home / Self Care | Payer: Self-pay | Source: Ambulatory Visit | Attending: Cardiovascular Disease | Admitting: Cardiovascular Disease

## 2013-10-01 ENCOUNTER — Encounter: Payer: Self-pay | Admitting: Cardiovascular Disease

## 2013-10-01 VITALS — BP 128/72 | HR 88 | Ht 70.0 in | Wt 261.0 lb

## 2013-10-01 DIAGNOSIS — R079 Chest pain, unspecified: Secondary | ICD-10-CM

## 2013-10-01 DIAGNOSIS — R7982 Elevated C-reactive protein (CRP): Secondary | ICD-10-CM

## 2013-10-01 DIAGNOSIS — K219 Gastro-esophageal reflux disease without esophagitis: Secondary | ICD-10-CM

## 2013-10-01 NOTE — Patient Instructions (Signed)
Your physician recommends that you schedule a follow-up appointment in: AS NEEDED Your physician recommends that you continue on your current medications as directed. Please refer to the Current Medication list given to you today. Your physician has requested that you have an exercise tolerance test. For further information please visit [email or web address]. Please also follow instruction sheet, as given.  CALCIUM  SCORE  TODAY

## 2013-10-01 NOTE — Assessment & Plan Note (Signed)
Atypical  Previous normal myovue and echo.  F/U ETT Also recommended calcium score since he has HTN, borderline DM and c reactive protein is upper limits of normal

## 2013-10-01 NOTE — Assessment & Plan Note (Signed)
Continue dexilant may be related to chest pain

## 2013-10-01 NOTE — Progress Notes (Signed)
Patient ID: Albert Griffin, male   DOB: 12/26/1960, 53 y.o.   MRN: [id number] 53 yo of Dr Jeffie Pollock. Last seen in 2013 for  atypical chest pain and palpitations. R/O Telemetry normal. Echo normal and stress myovue normal with EF 52% . Seeing Amy Stevenson's PA Melissa. Compliant with BP meds now .  Seen by primary and referred back for more atypical pain and CRP of .60 which is actually upper limits of normal.  He has sharp pains in his chest some radiation to left arm.  Has cervical neck issues.  Pain not exertional intermitant  Also has GERD with OTC meds not helping and some improvement with chest / epigastric pain with dexilant.  No dyspnea palpitations or syncope.  Borderline DM with A1c 6.1  LDL 90    Had skin lesion removed by dermatologist on mid back last week Healing well     ROS: Denies fever, malais, weight loss, blurry vision, decreased visual acuity, cough, sputum, SOB, hemoptysis, pleuritic pain, palpitaitons, heartburn, abdominal pain, melena, lower extremity edema, claudication, or rash.  All other systems reviewed and negative  General: Affect appropriate Obese white male  HEENT: normal Neck supple with no adenopathy JVP normal no bruits no thyromegaly Lungs clear with no wheezing and good diaphragmatic motion Heart:  S1/S2 no murmur, no rub, gallop or click PMI normal Abdomen: benighn, BS positve, no tenderness, no AAA no bruit.  No HSM or HJR Distal pulses intact with no bruits No edema Neuro non-focal Skin warm and dry  Incision from skin excision mid back  No muscular weakness   Current Outpatient Prescriptions  Medication Sig Dispense Refill  . dexlansoprazole (DEXILANT) 60 MG capsule Take 1 capsule (60 mg total) by mouth daily.  30 capsule  3  . fexofenadine (ALLEGRA) 180 MG tablet Take 180 mg by mouth daily.      Marland Kitchen losartan (COZAAR) 50 MG tablet TAKE 1 TABLET BY MOUTH EVERY DAY  90 tablet  0  . omeprazole (PRILOSEC) 40 MG capsule Take 40 mg by mouth 2 (two)  times daily.      . TURMERIC PO Take 500 mg by mouth daily.      Marland Kitchen VITAMIN E PO Take 1 tablet by mouth daily.      . nitroGLYCERIN (NITROSTAT) 0.4 MG SL tablet Place 1 tablet (0.4 mg total) under the tongue every 5 (five) minutes as needed for chest pain.  60 tablet  3   Current Facility-Administered Medications  Medication Dose Route Frequency Provider Last Rate Last Dose  . 0.9 %  sodium chloride infusion  500 mL Intravenous Continuous Sable Feil, MD        Allergies  Iodides  Electrocardiogram:  SR rate 71 normal   Assessment and Plan

## 2013-10-12 ENCOUNTER — Other Ambulatory Visit: Payer: Self-pay | Admitting: Emergency Medicine

## 2013-10-12 ENCOUNTER — Telehealth: Payer: Self-pay | Admitting: Cardiovascular Disease

## 2013-10-12 NOTE — Telephone Encounter (Signed)
PT  AWARE OF CA SCORE  RESULTS .Albert Griffin

## 2013-10-12 NOTE — Telephone Encounter (Signed)
New message  ° ° °Returning call back to nurse.  °

## 2013-11-03 ENCOUNTER — Encounter: Payer: Self-pay | Admitting: Physician Assistant

## 2013-11-03 ENCOUNTER — Other Ambulatory Visit: Payer: Self-pay | Admitting: *Deleted

## 2013-11-03 ENCOUNTER — Ambulatory Visit (INDEPENDENT_AMBULATORY_CARE_PROVIDER_SITE_OTHER): Payer: 59 | Admitting: Physician Assistant

## 2013-11-03 ENCOUNTER — Encounter (HOSPITAL_COMMUNITY): Payer: 59

## 2013-11-03 DIAGNOSIS — R079 Chest pain, unspecified: Secondary | ICD-10-CM

## 2013-11-03 NOTE — Progress Notes (Signed)
Exercise Treadmill Test  Pre-Exercise Testing Evaluation Rhythm: normal sinus  Rate: 69 bpm     Test  Exercise Tolerance Test Ordering MD: Jenkins Rouge, MD  Interpreting MD: Richardson Dopp, PA-C  Unique Test No: 1  Treadmill:  1  Indication for ETT: chest pain - rule out ischemia  Contraindication to ETT: No   Stress Modality: exercise - treadmill  Cardiac Imaging Performed: non   Protocol: standard Bruce - maximal  Max BP:  230/106  Max MPHR (bpm):  169 85% MPR (bpm):  144  MPHR obtained (bpm):  146 % MPHR obtained:  86  Reached 85% MPHR (min:sec):  8:18 Total Exercise Time (min-sec):  9:00  Workload in METS:  10.1 Borg Scale: 13  Reason ETT Terminated:  exaggerated hypertensive response    ST Segment Analysis At Rest: normal ST segments - no evidence of significant ST depression With Exercise: non-specific ST changes  Other Information Arrhythmia:  No Angina during ETT:  present (1) Quality of ETT:  diagnostic  ETT Interpretation:  normal - no evidence of ischemia by ST analysis  Comments: Good exercise capacity. He did c/o chest pain before and during exercise. Exaggerated hypertensive BP response to exercise (patient did not take HTN medications last night). No ST changes to suggest ischemia.   Recommendations: Patient to get back on regular schedule with HTN medications. FU with Dr. Jenkins Rouge as directed. Signed,  Richardson Dopp, PA-C   11/03/2013 10:00 AM

## 2013-11-07 DIAGNOSIS — J309 Allergic rhinitis, unspecified: Secondary | ICD-10-CM | POA: Insufficient documentation

## 2013-11-07 DIAGNOSIS — E538 Deficiency of other specified B group vitamins: Secondary | ICD-10-CM

## 2013-11-10 ENCOUNTER — Ambulatory Visit (INDEPENDENT_AMBULATORY_CARE_PROVIDER_SITE_OTHER): Payer: 59 | Admitting: Physician Assistant

## 2013-11-10 ENCOUNTER — Encounter: Payer: Self-pay | Admitting: Physician Assistant

## 2013-11-10 VITALS — BP 150/88 | HR 80 | Temp 97.9°F | Resp 16 | Ht 69.5 in | Wt 264.0 lb

## 2013-11-10 DIAGNOSIS — R972 Elevated prostate specific antigen [PSA]: Secondary | ICD-10-CM

## 2013-11-10 DIAGNOSIS — Z0000 Encounter for general adult medical examination without abnormal findings: Secondary | ICD-10-CM

## 2013-11-10 DIAGNOSIS — K760 Fatty (change of) liver, not elsewhere classified: Secondary | ICD-10-CM

## 2013-11-10 DIAGNOSIS — E538 Deficiency of other specified B group vitamins: Secondary | ICD-10-CM

## 2013-11-10 DIAGNOSIS — K7689 Other specified diseases of liver: Secondary | ICD-10-CM

## 2013-11-10 DIAGNOSIS — I10 Essential (primary) hypertension: Secondary | ICD-10-CM

## 2013-11-10 DIAGNOSIS — E669 Obesity, unspecified: Secondary | ICD-10-CM

## 2013-11-10 LAB — CBC WITH DIFFERENTIAL/PLATELET
Basophils Absolute: 0.1 10*3/uL (ref 0.0–0.1)
Basophils Relative: 1 % (ref 0–1)
EOS PCT: 3 % (ref 0–5)
Eosinophils Absolute: 0.2 10*3/uL (ref 0.0–0.7)
HCT: 43 % (ref 39.0–52.0)
HEMOGLOBIN: 15.2 g/dL (ref 13.0–17.0)
LYMPHS ABS: 1.5 10*3/uL (ref 0.7–4.0)
LYMPHS PCT: 21 % (ref 12–46)
MCH: 29.9 pg (ref 26.0–34.0)
MCHC: 35.3 g/dL (ref 30.0–36.0)
MCV: 84.5 fL (ref 78.0–100.0)
MONOS PCT: 8 % (ref 3–12)
Monocytes Absolute: 0.6 10*3/uL (ref 0.1–1.0)
Neutro Abs: 4.7 10*3/uL (ref 1.7–7.7)
Neutrophils Relative %: 67 % (ref 43–77)
PLATELETS: 240 10*3/uL (ref 150–400)
RBC: 5.09 MIL/uL (ref 4.22–5.81)
RDW: 13.6 % (ref 11.5–15.5)
WBC: 7 10*3/uL (ref 4.0–10.5)

## 2013-11-10 MED ORDER — PRAMOXINE-HC 1-2.5 % EX LOTN: TOPICAL_LOTION | CUTANEOUS | Status: DC

## 2013-11-10 MED ORDER — LOSARTAN POTASSIUM 100 MG PO TABS: ORAL_TABLET | ORAL | Status: DC

## 2013-11-10 NOTE — Progress Notes (Signed)
Complete Physical  Assessment and Plan: Hypertension- Cozaar 100mg  increase due to increase BP with exercise during stress test.   Obesity with co morbidities- long discussion about weight loss, diet, and exercise  GERD (gastroesophageal reflux disease)- continue dexilant   B12 check level  GERD (gastroesophageal reflux disease)- continue dexilant  Allergic rhinitis- Allegra OTC, increase H20, allergy hygiene explained.  Anemia- check cbc  Chest pain- normal cardio- likely costocondritis.  Dyspnea- ? Deconditioning- states can get PFTs at work, normal cardiac BPH symtpoms- check PSA, may need doxazosin CT chest showed nodule- get CT in 1 year  Discussed med's effects and SE's. Screening labs and tests as requested with regular follow-up as recommended.  HPI  His blood pressure has been controlled at home but he did have an exaggerated BP response while on treadmill, today their BP is BP: 150/88 mmHg He had recent normal cardiac work up.  He does not workout, but he is very active. He denies chest pain, shortness of breath, dizziness.  He is not on cholesterol medication and denies myalgias. His cholesterol is at goal. The cholesterol last visit was:   Lab Results  Component Value Date   CHOL 165 07/30/2013   HDL 49 07/30/2013   LDLCALC 90 07/30/2013   TRIG 130 07/30/2013   CHOLHDL 3.4 07/30/2013    He has been working on diet and exercise for prediabetes, and denies paresthesia of the feet, polydipsia, polyuria and visual disturbances. Last A1C in the office was:  Lab Results  Component Value Date   HGBA1C 6.1* 07/30/2013   Last PSA was 4.46 and got better with ABX became 2.14, he is complaining of hesitancy and dribbling.  Patient is on Vitamin D supplement.   BMI is Body mass index is 38.44 kg/(m^2)., he states that he can not get motivated. He does drink heavily during the summer but states he has not drank as much before.  Wt Readings from Last 3 Encounters:  11/10/13 264 lb  (119.75 kg)  10/01/13 261 lb (118.389 kg)  07/30/13 257 lb (116.574 kg)    Current Medications:    Medication List       This list is accurate as of: 11/10/13  3:44 PM.  Always use your most recent med list.               dexlansoprazole 60 MG capsule  Commonly known as:  DEXILANT  Take 1 capsule (60 mg total) by mouth daily.     fexofenadine 180 MG tablet  Commonly known as:  ALLEGRA  Take 180 mg by mouth daily.     hydrocortisone-pramoxine 1-2.5 % Lotn  Commonly known as:  PRAMOSONE  Apply topically 2 (two) times daily.     losartan 50 MG tablet  Commonly known as:  COZAAR  TAKE 1 TABLET BY MOUTH EVERY DAY     nitroGLYCERIN 0.4 MG SL tablet  Commonly known as:  NITROSTAT  Place 1 tablet (0.4 mg total) under the tongue every 5 (five) minutes as needed for chest pain.       Health Maintenance:  Immunization History  Administered Date(s) Administered  . Pneumococcal-Unspecified 02/19/1995  . Tdap 10/07/2012   Tetanus: 2014 Pneumovax: 1997 Flu vaccine: declines Zostavax: DEXA: Colonoscopy:06/2011 EGD: Stress test 10/2013 CT cardiac score 10/2013 normal Echo 2013  Allergies:  Allergies  Allergen Reactions  . Iodides Hives   Medical History:  Past Medical History  Diagnosis Date  . Hypertension   . Obesity   . Chest pain   .  Tendonitis   . GERD (gastroesophageal reflux disease)   . Vitamin B12 deficiency   . GERD (gastroesophageal reflux disease) 08/01/2013  . Allergic rhinitis   . Anemia    Surgical History:  Past Surgical History  Procedure Laterality Date  . Back surgery     Family History:  Family History  Problem Relation Age of Onset  . Lung cancer Mother   . Breast cancer Mother   . Esophageal cancer Father   . Heart disease Sister   . Heart disease Maternal Uncle    Social History:   History  Substance Use Topics  . Smoking status: Former Research scientist (life sciences)  . Smokeless tobacco: Former Systems developer  . Alcohol Use: Yes     Comment: occasional,  nothing recent   ROS:  [X]  = complains of  [ ]  = denies  General: Fatigue [ ]  Fever [ ]  Chills [ ]  Weakness [ ]   Insomnia [ ]  Weight change [ ]  Night sweats [ ]   Change in appetite [ ]  Eyes: Redness [ ]  Blurred vision [ ]  Diplopia [ ]  Discharge [ ]   ENT: Congestion [ ]  Sinus Pain [ ]  Post Nasal Drip [ ]  Sore Throat [ ]  Earache [ ]  hearing loss [ ]  Tinnitus [ ]  Snoring [ ]   Cardiac: Chest pain/pressure [ ]  SOB [ ]  Orthopnea [ ]   Palpitations [ ]   Paroxysmal nocturnal dyspnea[ ]  Claudication [ ]  Edema [ ]   Pulmonary: Cough [ ]  Wheezing[ ]   SOB [ ]   Pleurisy [ ]   GI: Nausea [ ]  Vomiting[ ]  Dysphagia[ ]  Heartburn[ ]  Abdominal pain [ ]  Constipation [ ] ; Diarrhea [ ]  BRBPR [ ]  Melena[ ]  Bloating [ ]  Hemorrhoids [ ]   GU: Hematuria[ ]  Dysuria [ ]  Nocturia[ ]  Urgency [ ]   Hesitancy [ ]  Discharge [ ]  Frequency [ ]   Neuro: Headaches[ ]  Vertigo[ ]  Paresthesias[ ]  Spasm [ ]  Speech changes [ ]  Incoordination [ ]   Ortho: Arthritis [ ]  Joint pain [ ]  Muscle pain [ ]  Joint swelling [ ]  Back Pain [ ]  Skin:  Rash [ ]   Pruritis [ ]  Change in skin lesion [ ]   Psych: Depression[ ]  Anxiety[ ]  Confusion [ ]  Memory loss [ ]   Heme/Lypmh: Bleeding [ ]  Bruising [ ]  Enlarged lymph nodes [ ]   Endocrine: Visual blurring [ ]  Paresthesia [ ]  Polyuria [ ]  Polydypsea [ ]    Heat/cold intolerance [ ]  Hypoglycemia [ ]   Physical Exam: Estimated body mass index is 38.44 kg/(m^2) as calculated from the following:   Height as of this encounter: 5' 9.5" (1.765 m).   Weight as of this encounter: 264 lb (119.75 kg). BP 150/88  Pulse 80  Temp(Src) 97.9 F (36.6 C)  Resp 16  Ht 5' 9.5" (1.765 m)  Wt 264 lb (119.75 kg)  BMI 38.44 kg/m2 General Appearance: Well nourished, in no apparent distress. Eyes: PERRLA, EOMs, conjunctiva no swelling or erythema, normal fundi and vessels. Sinuses: No Frontal/maxillary tenderness ENT/Mouth: Ext aud canals clear, normal light reflex with TMs without erythema, bulging. Good dentition. No  erythema, swelling, or exudate on post pharynx. Tonsils not swollen or erythematous. Hearing normal.  Neck: Supple, thyroid normal. No bruits Respiratory: Respiratory effort normal, BS equal bilaterally without rales, rhonchi, wheezing or stridor. Cardio: RRR without murmurs, rubs or gallops. Brisk peripheral pulses without edema.  Chest: symmetric, with normal excursions and percussion. Abdomen: Soft, +BS. Non tender, no guarding, rebound, hernias, masses, or organomegaly. .  Lymphatics: Non tender without  lymphadenopathy.  Genitourinary: defer Musculoskeletal: Full ROM all peripheral extremities,5/5 strength, and normal gait. Skin: Warm, dry without rashes, lesions, ecchymosis.  Neuro: Cranial nerves intact, reflexes equal bilaterally. Normal muscle tone, no cerebellar symptoms. Sensation intact.  Psych: Awake and oriented X 3, normal affect, Insight and Judgment appropriate.   EKG: defer AORTA SCAN: defer   [name] 3:36 PM

## 2013-11-10 NOTE — Patient Instructions (Addendum)
Costochondritis Costochondritis is a condition in which the tissue (cartilage) that connects your ribs with your breastbone (sternum) becomes irritated. It causes pain in the chest and rib area. It usually goes away on its own over time. HOME CARE  Avoid activities that wear you out.  Do not strain your ribs. Avoid activities that use your:  Chest.  Belly.  Side muscles.  Put ice on the area for the first 2 days after the pain starts.  Put ice in a plastic bag.  Place a towel between your skin and the bag.  Leave the ice on for 20 minutes, 2-3 times a day.  Only take medicine as told by your doctor. GET HELP IF:  You have redness or puffiness (swelling) in the rib area.  Your pain does not go away with rest or medicine. GET HELP RIGHT AWAY IF:   Your pain gets worse.  You are very uncomfortable.  You have trouble breathing.  You cough up blood.  You start sweating or throwing up (vomiting).  You have a fever or lasting symptoms for more than 2-3 days.  You have a fever and your symptoms suddenly get worse. MAKE SURE YOU:   Understand these instructions.  Will watch your condition.  Will get help right away if you are not doing well or get worse. Document Released: 07/24/2007 Document Revised: 10/07/2012 Document Reviewed: 09/08/2012 [facility] Patient Information 2015 [place], [place]. This information is not intended to replace advice given to you by your health care provider. Make sure you discuss any questions you have with your health care provider.  Preventative Care for Adults, Male       REGULAR HEALTH EXAMS:  A routine yearly physical is a good way to check in with your primary care provider about your health and preventive screening. It is also an opportunity to share updates about your health and any concerns you have, and receive a thorough all-over exam.   Most health insurance companies pay for at least some preventative services.  Check with your  health plan for specific coverages.  WHAT PREVENTATIVE SERVICES DO MEN NEED?  Adult men should have their weight and blood pressure checked regularly.   Men age 52 and older should have their cholesterol levels checked regularly.  Beginning at age 11 and continuing to age 37, men should be screened for colorectal cancer.  Certain people should may need continued testing until age 1.  Other cancer screening may include exams for testicular and prostate cancer.  Updating vaccinations is part of preventative care.  Vaccinations help protect against diseases such as the flu.  Lab tests are generally done as part of preventative care to screen for anemia and blood disorders, to screen for problems with the kidneys and liver, to screen for bladder problems, to check blood sugar, and to check your cholesterol level.  Preventative services generally include counseling about diet, exercise, avoiding tobacco, drugs, excessive alcohol consumption, and sexually transmitted infections.    GENERAL RECOMMENDATIONS FOR GOOD HEALTH:  Healthy diet:  Eat a variety of foods, including fruit, vegetables, animal or vegetable protein, such as meat, fish, chicken, and eggs, or beans, lentils, tofu, and grains, such as rice.  Drink plenty of water daily.  Decrease saturated fat in the diet, avoid lots of red meat, processed foods, sweets, fast foods, and fried foods.  Exercise:  Aerobic exercise helps maintain good heart health. At least 30-40 minutes of moderate-intensity exercise is recommended. For example, a brisk walk that increases  your heart rate and breathing. This should be done on most days of the week.   Find a type of exercise or a variety of exercises that you enjoy so that it becomes a part of your daily life.  Examples are running, walking, swimming, water aerobics, and biking.  For motivation and support, explore group exercise such as aerobic class, spin class, Zumba, Yoga,or  martial arts,  etc.    Set exercise goals for yourself, such as a certain weight goal, walk or run in a race such as a 5k walk/run.  Speak to your primary care provider about exercise goals.  Disease prevention:  If you smoke or chew tobacco, find out from your caregiver how to quit. It can literally save your life, no matter how long you have been a tobacco user. If you do not use tobacco, never begin.   Maintain a healthy diet and normal weight. Increased weight leads to problems with blood pressure and diabetes.   The Body Mass Index or BMI is a way of measuring how much of your body is fat. Having a BMI above 27 increases the risk of heart disease, diabetes, hypertension, stroke and other problems related to obesity. Your caregiver can help determine your BMI and based on it develop an exercise and dietary program to help you achieve or maintain this important measurement at a healthful level.  High blood pressure causes heart and blood vessel problems.  Persistent high blood pressure should be treated with medicine if weight loss and exercise do not work.   Fat and cholesterol leaves deposits in your arteries that can block them. This causes heart disease and vessel disease elsewhere in your body.  If your cholesterol is found to be high, or if you have heart disease or certain other medical conditions, then you may need to have your cholesterol monitored frequently and be treated with medication.   Ask if you should have a stress test if your history suggests this. A stress test is a test done on a treadmill that looks for heart disease. This test can find disease prior to there being a problem.  Avoid drinking alcohol in excess (more than two drinks per day).  Avoid use of street drugs. Do not share needles with anyone. Ask for professional help if you need assistance or instructions on stopping the use of alcohol, cigarettes, and/or drugs.  Brush your teeth twice a day with fluoride toothpaste, and  floss once a day. Good oral hygiene prevents tooth decay and gum disease. The problems can be painful, unattractive, and can cause other health problems. Visit your dentist for a routine oral and dental check up and preventive care every 6-12 months.   Look at your skin regularly.  Use a mirror to look at your back. Notify your caregivers of changes in moles, especially if there are changes in shapes, colors, a size larger than a pencil eraser, an irregular border, or development of new moles.  Safety:  Use seatbelts 100% of the time, whether driving or as a passenger.  Use safety devices such as hearing protection if you work in environments with loud noise or significant background noise.  Use safety glasses when doing any work that could send debris in to the eyes.  Use a helmet if you ride a bike or motorcycle.  Use appropriate safety gear for contact sports.  Talk to your caregiver about gun safety.  Use sunscreen with a SPF (or skin protection factor) of 15 or  greater.  Lighter skinned people are at a greater risk of skin cancer. Don't forget to also wear sunglasses in order to protect your eyes from too much damaging sunlight. Damaging sunlight can accelerate cataract formation.   Practice safe sex. Use condoms. Condoms are used for birth control and to help reduce the spread of sexually transmitted infections (or STIs).  Some of the STIs are gonorrhea (the clap), chlamydia, syphilis, trichomonas, herpes, HPV (human papilloma virus) and HIV (human immunodeficiency virus) which causes AIDS. The herpes, HIV and HPV are viral illnesses that have no cure. These can result in disability, cancer and death.   Keep carbon monoxide and smoke detectors in your home functioning at all times. Change the batteries every 6 months or use a model that plugs into the wall.   Vaccinations:  Stay up to date with your tetanus shots and other required immunizations. You should have a booster for tetanus every 10  years. Be sure to get your flu shot every year, since 5%-20% of the [place] population comes down with the flu. The flu vaccine changes each year, so being vaccinated once is not enough. Get your shot in the fall, before the flu season peaks.   Other vaccines to consider:  Pneumococcal vaccine to protect against certain types of pneumonia.  This is normally recommended for adults age 32 or older.  However, adults younger than 51 years old with certain underlying conditions such as diabetes, heart or lung disease should also receive the vaccine.  Shingles vaccine to protect against Varicella Zoster if you are older than age 30, or younger than 51 years old with certain underlying illness.  Hepatitis A vaccine to protect against a form of infection of the liver by a virus acquired from food.  Hepatitis B vaccine to protect against a form of infection of the liver by a virus acquired from blood or body fluids, particularly if you work in health care.  If you plan to travel internationally, check with your local health department for specific vaccination recommendations.  Cancer Screening:  Most routine colon cancer screening begins at the age of 4. On a yearly basis, doctors may provide special easy to use take-home tests to check for hidden blood in the stool. Sigmoidoscopy or colonoscopy can detect the earliest forms of colon cancer and is life saving. These tests use a small camera at the end of a tube to directly examine the colon. Speak to your caregiver about this at age 2, when routine screening begins (and is repeated every 5 years unless early forms of pre-cancerous polyps or small growths are found).   At the age of 5 men usually start screening for prostate cancer every year. Screening may begin at a younger age for those with higher risk. Those at higher risk include African-Americans or having a family history of prostate cancer. There are two types of tests for prostate cancer:    Prostate-specific antigen (PSA) testing. Recent studies raise questions about prostate cancer using PSA and you should discuss this with your caregiver.   Digital rectal exam (in which your doctor's lubricated and gloved finger feels for enlargement of the prostate through the anus).   Screening for testicular cancer.  Do a monthly exam of your testicles. Gently roll each testicle between your thumb and fingers, feeling for any abnormal lumps. The best time to do this is after a hot shower or bath when the tissues are looser. Notify your caregivers of any lumps, tenderness or changes  in size or shape immediately.

## 2013-11-11 LAB — URINALYSIS, ROUTINE W REFLEX MICROSCOPIC
Bilirubin Urine: NEGATIVE
GLUCOSE, UA: NEGATIVE mg/dL
HGB URINE DIPSTICK: NEGATIVE
KETONES UR: NEGATIVE mg/dL
Leukocytes, UA: NEGATIVE
Nitrite: NEGATIVE
PH: 6.5 (ref 5.0–8.0)
Protein, ur: NEGATIVE mg/dL
Specific Gravity, Urine: 1.009 (ref 1.005–1.030)
Urobilinogen, UA: 0.2 mg/dL (ref 0.0–1.0)

## 2013-11-11 LAB — HEPATIC FUNCTION PANEL
ALBUMIN: 4.2 g/dL (ref 3.5–5.2)
ALT: 44 U/L (ref 0–53)
AST: 30 U/L (ref 0–37)
Alkaline Phosphatase: 59 U/L (ref 39–117)
BILIRUBIN TOTAL: 0.8 mg/dL (ref 0.2–1.2)
Bilirubin, Direct: 0.2 mg/dL (ref 0.0–0.3)
Indirect Bilirubin: 0.6 mg/dL (ref 0.2–1.2)
Total Protein: 6.7 g/dL (ref 6.0–8.3)

## 2013-11-11 LAB — BASIC METABOLIC PANEL WITH GFR
BUN: 15 mg/dL (ref 6–23)
CHLORIDE: 105 meq/L (ref 96–112)
CO2: 27 meq/L (ref 19–32)
CREATININE: 0.91 mg/dL (ref 0.50–1.35)
Calcium: 9.6 mg/dL (ref 8.4–10.5)
GFR, Est African American: >89 mL/min
GFR, Est Non African American: >89 mL/min
GLUCOSE: 83 mg/dL (ref 70–99)
Potassium: 4.2 mEq/L (ref 3.5–5.3)
Sodium: 138 mEq/L (ref 135–145)

## 2013-11-11 LAB — IRON AND TIBC
%SAT: 23 % (ref 20–55)
Iron: 71 ug/dL (ref 42–165)
TIBC: 309 ug/dL (ref 215–435)
UIBC: 238 ug/dL (ref 125–400)

## 2013-11-11 LAB — TSH: TSH: 1.78 u[IU]/mL (ref 0.350–4.500)

## 2013-11-11 LAB — MICROALBUMIN / CREATININE URINE RATIO
Creatinine, Urine: 59.5 mg/dL
MICROALB/CREAT RATIO: 3.4 mg/g (ref 0.0–30.0)
Microalb, Ur: 0.2 mg/dL (ref <2.0)

## 2013-11-11 LAB — LIPID PANEL
CHOL/HDL RATIO: 3.2 ratio
Cholesterol: 175 mg/dL (ref 0–200)
HDL: 55 mg/dL (ref >39)
LDL Cholesterol: 98 mg/dL (ref 0–99)
Triglycerides: 109 mg/dL (ref <150)
VLDL: 22 mg/dL (ref 0–40)

## 2013-11-11 LAB — HEMOGLOBIN A1C
HEMOGLOBIN A1C: 5.9 % — AB (ref <5.7)
Mean Plasma Glucose: 123 mg/dL — ABNORMAL HIGH (ref <117)

## 2013-11-11 LAB — URINE CULTURE
COLONY COUNT: NO GROWTH
Organism ID, Bacteria: NO GROWTH

## 2013-11-11 LAB — INSULIN, FASTING: Insulin fasting, serum: 20.1 u[IU]/mL — ABNORMAL HIGH (ref 2.0–19.6)

## 2013-11-11 LAB — VITAMIN B12: VITAMIN B 12: 739 pg/mL (ref 211–911)

## 2013-11-11 LAB — FERRITIN: Ferritin: 105 ng/mL (ref 22–322)

## 2013-11-11 LAB — VITAMIN D 25 HYDROXY (VIT D DEFICIENCY, FRACTURES): Vit D, 25-Hydroxy: 45 ng/mL (ref 30–89)

## 2013-11-11 LAB — MAGNESIUM: MAGNESIUM: 1.8 mg/dL (ref 1.5–2.5)

## 2013-11-11 LAB — PSA: PSA: 2.48 ng/mL (ref <=4.00)

## 2013-11-12 ENCOUNTER — Other Ambulatory Visit: Payer: Self-pay | Admitting: Physician Assistant

## 2013-11-12 ENCOUNTER — Encounter: Payer: Self-pay | Admitting: Physician Assistant

## 2013-11-12 MED ORDER — HYDROCORTISONE ACE-PRAMOXINE 2.5-1 % RE CREA: 1.0000 "application " | TOPICAL_CREAM | Freq: Three times a day (TID) | RECTAL | Status: DC

## 2013-12-03 ENCOUNTER — Other Ambulatory Visit: Payer: Self-pay

## 2013-12-30 ENCOUNTER — Other Ambulatory Visit: Payer: Self-pay | Admitting: Emergency Medicine

## 2014-01-02 ENCOUNTER — Emergency Department (HOSPITAL_COMMUNITY)
Admission: EM | Admit: 2014-01-02 | Discharge: 2014-01-02 | Disposition: A | Discharge status: Home / Self Care | Payer: 59 | Source: Home / Self Care | Attending: Emergency Medicine | Admitting: Emergency Medicine

## 2014-01-02 ENCOUNTER — Encounter (HOSPITAL_COMMUNITY): Payer: Self-pay | Admitting: Emergency Medicine

## 2014-01-02 ENCOUNTER — Emergency Department (INDEPENDENT_AMBULATORY_CARE_PROVIDER_SITE_OTHER): Payer: 59

## 2014-01-02 DIAGNOSIS — S61219A Laceration without foreign body of unspecified finger without damage to nail, initial encounter: Secondary | ICD-10-CM

## 2014-01-02 DIAGNOSIS — IMO0002 Reserved for concepts with insufficient information to code with codable children: Secondary | ICD-10-CM

## 2014-01-02 DIAGNOSIS — T148 Other injury of unspecified body region: Secondary | ICD-10-CM

## 2014-01-02 MED ORDER — BACITRACIN 500 UNIT/GM EX OINT: 1.0000 "application " | TOPICAL_OINTMENT | Freq: Once | CUTANEOUS | Status: DC

## 2014-01-02 MED ORDER — LIDOCAINE HCL (PF) 2 % IJ SOLN
INTRAMUSCULAR | Status: AC
  Filled 2014-01-02: qty 2

## 2014-01-02 NOTE — ED Provider Notes (Signed)
CSN: [id number]     Arrival date & time 01/02/14  1625 History   First MD Initiated Contact with Patient 01/02/14 1651     Chief Complaint  Patient presents with  . Laceration   (Consider location/radiation/quality/duration/timing/severity/associated sxs/prior Treatment) HPI Comments: Using a small saw and received a laceration to the left long finger, lesser to the ring finger . Occurred just prior to arrival.   Past Medical History  Diagnosis Date  . Hypertension   . Obesity   . Chest pain   . Tendonitis   . GERD (gastroesophageal reflux disease)   . Vitamin B12 deficiency   . GERD (gastroesophageal reflux disease) 08/01/2013  . Allergic rhinitis   . Anemia    Past Surgical History  Procedure Laterality Date  . Back surgery     Family History  Problem Relation Age of Onset  . Lung cancer Mother   . Breast cancer Mother   . Esophageal cancer Father   . Heart disease Sister   . Heart disease Maternal Uncle    History  Substance Use Topics  . Smoking status: Former Research scientist (life sciences)  . Smokeless tobacco: Former Systems developer  . Alcohol Use: Yes     Comment: occasional, nothing recent    Review of Systems  Constitutional: Negative.   Cardiovascular: Negative for chest pain.  Skin: Positive for wound.  Neurological: Negative for dizziness, tremors, syncope, light-headedness and headaches.  Psychiatric/Behavioral: Negative.     Allergies  Iodides  Home Medications   Prior to Admission medications   Medication Sig Start Date End Date Taking? Authorizing Provider  DEXILANT 60 MG capsule TAKE 1 CAPSULE BY MOUTH EVERY DAY 12/30/13   Unk Pinto, MD  fexofenadine (ALLEGRA) 180 MG tablet Take 180 mg by mouth daily.    Historical Provider, MD  hydrocortisone-pramoxine [facility]) 2.5-1 % rectal cream Place 1 application rectally 3 (three) times daily. 11/12/13   [name], PA-C  losartan (COZAAR) 100 MG tablet TAKE 1 TABLET BY MOUTH EVERY DAY 11/10/13   [name], PA-C    BP 130/70 mmHg  Pulse 73  Temp(Src) 97.7 F (36.5 C) (Oral)  Resp 16  SpO2 100% Physical Exam  Constitutional: He is oriented to person, place, and time. He appears well-developed and well-nourished. No distress.  Pulmonary/Chest: Effort normal. No respiratory distress.  Musculoskeletal:  Middle phalynx , flexor surface of right long finger with a ragged edged laceration   More superficial abrasion to adjacent ring finger  Neurological: He is alert and oriented to person, place, and time. He exhibits normal muscle tone.  Skin: Skin is warm and dry.  Nursing note and vitals reviewed.   ED Course  LACERATION REPAIR Date/Time: 01/02/2014 6:33 PM Performed by: Marcha Dutton, Citlalli Weikel Authorized by: Philipp Deputy C Consent: Verbal consent obtained. Risks and benefits: risks, benefits and alternatives were discussed Consent given by: patient Patient understanding: patient states understanding of the procedure being performed Patient identity confirmed: verbally with patient Body area: upper extremity Location details: left long finger Laceration length: 2.5 cm Foreign bodies: no foreign bodies Tendon involvement: none Nerve involvement: none Vascular damage: no Anesthesia: local infiltration Local anesthetic: lidocaine 2% without epinephrine Anesthetic total: 5 ml Patient sedated: no Preparation: Patient was prepped and draped in the usual sterile fashion. Irrigation solution: saline Irrigation method: syringe Amount of cleaning: standard Debridement: moderate Skin closure: 4-0 Prolene Number of sutures: 6 Approximation: close Approximation difficulty: simple Dressing: antibiotic ointment   (including critical care time) Labs Review Labs Reviewed - No data  to display  Imaging Review Dg Finger Middle Left  01/02/2014   CLINICAL DATA:  Cut middle phalanx of the left third digit today on skill saw. Initial encounter.  EXAM: LEFT MIDDLE FINGER 2+V  COMPARISON:  None.   FINDINGS: There is a soft tissue laceration about the palmar aspect of the middle phalanx of the third digit. This finding is without associated displaced fracture or radiopaque foreign body. Joint spaces are preserved. No erosions.  IMPRESSION: Soft tissue laceration about the palmar aspect of the middle phalanx of the third digit without associated fracture or radiopaque foreign body.   Electronically Signed   By: Sandi Mariscal M.D.   On: 01/02/2014 17:36     MDM   1. Finger laceration, initial encounter   2. Laceration    Laceration repair/sutured Dressing and splint to finger Return for problems discussed SR 10 d Reports Td 2009 or 08   Janne Napoleon, NP 01/02/14 1835

## 2014-01-02 NOTE — Discharge Instructions (Signed)

## 2014-01-02 NOTE — ED Notes (Signed)
Last tetanus 10/07/12

## 2014-01-02 NOTE — ED Notes (Signed)
Patient allergic to betadine.  Janne Napoleon np aware.  Soaking hand in saline and peroxide

## 2014-01-02 NOTE — ED Notes (Signed)
Patient cut middle finger and index finger with a chain saw.  Numbness to tip of fingers, able to move fingers, and reports he thinks the saw touched bone.  Bleeding controlled.

## 2014-01-05 ENCOUNTER — Telehealth (HOSPITAL_COMMUNITY): Payer: Self-pay | Admitting: Family Medicine

## 2014-01-12 ENCOUNTER — Encounter (HOSPITAL_COMMUNITY): Payer: Self-pay | Admitting: Emergency Medicine

## 2014-01-12 ENCOUNTER — Emergency Department (INDEPENDENT_AMBULATORY_CARE_PROVIDER_SITE_OTHER)
Admission: EM | Admit: 2014-01-12 | Discharge: 2014-01-12 | Disposition: A | Discharge status: Home / Self Care | Payer: 59 | Source: Home / Self Care | Attending: Emergency Medicine | Admitting: Emergency Medicine

## 2014-01-12 DIAGNOSIS — Z4802 Encounter for removal of sutures: Secondary | ICD-10-CM

## 2014-01-12 NOTE — ED Notes (Addendum)
Patient presents for suture removal. Patient reports no no new problems.  Finger has been healing well. NAD.   Tinnie Gens, CMA

## 2014-01-12 NOTE — Discharge Instructions (Signed)
Leave the steri-strips on until they fall off on there own. Do not soak them in water. Stop using all creams or lotions. Keep the wound covered until completely healed, likely another week.  Follow up as needed.

## 2014-01-12 NOTE — ED Provider Notes (Signed)
CSN: [id number]     Arrival date & time 01/12/14  1417 History   First MD Initiated Contact with Patient 01/12/14 1525     No chief complaint on file.  (Consider location/radiation/quality/duration/timing/severity/associated sxs/prior Treatment) HPI He is a 53 year old man here today for suture removal. He cut his left middle finger using a saw 10 days ago. He was seen shortly after the injury occurred and the wound was sutured. He states it has been doing well. He has been applying Neosporin for a few days, that has been using hydrocortisone cream. He reports the distal finger is still numb, but denies pain.  Past Medical History  Diagnosis Date  . Hypertension   . Obesity   . Chest pain   . Tendonitis   . GERD (gastroesophageal reflux disease)   . Vitamin B12 deficiency   . GERD (gastroesophageal reflux disease) 08/01/2013  . Allergic rhinitis   . Anemia    Past Surgical History  Procedure Laterality Date  . Back surgery     Family History  Problem Relation Age of Onset  . Lung cancer Mother   . Breast cancer Mother   . Esophageal cancer Father   . Heart disease Sister   . Heart disease Maternal Uncle    History  Substance Use Topics  . Smoking status: Former Research scientist (life sciences)  . Smokeless tobacco: Former Systems developer  . Alcohol Use: Yes     Comment: occasional, nothing recent    Review of Systems wound Allergies  Iodides  Home Medications   Prior to Admission medications   Medication Sig Start Date End Date Taking? Authorizing Provider  DEXILANT 60 MG capsule TAKE 1 CAPSULE BY MOUTH EVERY DAY 12/30/13   Unk Pinto, MD  fexofenadine (ALLEGRA) 180 MG tablet Take 180 mg by mouth daily.    Historical Provider, MD  hydrocortisone-pramoxine [facility]) 2.5-1 % rectal cream Place 1 application rectally 3 (three) times daily. 11/12/13   [name], PA-C  losartan (COZAAR) 100 MG tablet TAKE 1 TABLET BY MOUTH EVERY DAY 11/10/13   [name], PA-C   BP 158/82 mmHg  Pulse 83   Temp(Src) 98.1 F (36.7 C) (Oral)  Resp 18  SpO2 98% Physical Exam  Constitutional: He is oriented to person, place, and time. He appears well-developed and well-nourished. No distress.  Cardiovascular: Normal rate.   Pulmonary/Chest: Effort normal.  Neurological: He is alert and oriented to person, place, and time.  Skin:  1.5cm jagged laceration to volar middle finger on left hand.  6 sutures in place.  Some sponginess of the wound edges, but no open wound.    ED Course  SUTURE REMOVAL Date/Time: 01/12/2014 3:42 PM Performed by: Melony Overly Authorized by: Melony Overly Consent: Verbal consent obtained. Risks and benefits: risks, benefits and alternatives were discussed Consent given by: patient Patient understanding: patient states understanding of the procedure being performed Patient identity confirmed: verbally with patient Time out: Immediately prior to procedure a "time out" was called to verify the correct patient, procedure, equipment, support staff and site/side marked as required. Body area: upper extremity Location details: left long finger Wound Appearance: moist and clean Sutures Removed: 6 Post-removal: Steri-Strips applied Facility: sutures placed in this facility Patient tolerance: Patient tolerated the procedure well with no immediate complications   (including critical care time) Labs Review Labs Reviewed - No data to display  Imaging Review No results found.   MDM   1. Visit for suture removal    Sutures removed and replaced  with Steri-Strips. Wound edges appear a little moist, likely from the various lotions he has been using. Recommended stopping all lotions and ointments. Okay to return to work. Follow-up as needed.    Melony Overly, MD 01/12/14 [phone]

## 2014-02-14 HISTORY — PX: BUNIONECTOMY: SHX129

## 2014-02-19 NOTE — ED Notes (Signed)
Note opened in error  [name], MD 02/19/14 [phone]

## 2014-03-01 ENCOUNTER — Encounter: Payer: Self-pay | Admitting: Physician Assistant

## 2014-03-01 ENCOUNTER — Other Ambulatory Visit: Payer: Self-pay | Admitting: Internal Medicine

## 2014-03-15 ENCOUNTER — Ambulatory Visit (INDEPENDENT_AMBULATORY_CARE_PROVIDER_SITE_OTHER): Payer: 59 | Admitting: Physician Assistant

## 2014-03-15 ENCOUNTER — Ambulatory Visit (HOSPITAL_COMMUNITY)
Admission: RE | Admit: 2014-03-15 | Discharge: 2014-03-15 | Disposition: A | Discharge status: Home / Self Care | Payer: 59 | Source: Ambulatory Visit | Attending: Physician Assistant | Admitting: Physician Assistant

## 2014-03-15 ENCOUNTER — Encounter: Payer: Self-pay | Admitting: Physician Assistant

## 2014-03-15 VITALS — BP 130/80 | HR 84 | Temp 97.7°F | Resp 16 | Ht 69.5 in | Wt 247.0 lb

## 2014-03-15 DIAGNOSIS — M545 Low back pain: Secondary | ICD-10-CM | POA: Insufficient documentation

## 2014-03-15 DIAGNOSIS — M5136 Other intervertebral disc degeneration, lumbar region: Secondary | ICD-10-CM | POA: Diagnosis not present

## 2014-03-15 DIAGNOSIS — R350 Frequency of micturition: Secondary | ICD-10-CM

## 2014-03-15 DIAGNOSIS — M47896 Other spondylosis, lumbar region: Secondary | ICD-10-CM | POA: Insufficient documentation

## 2014-03-15 MED ORDER — PREDNISONE 20 MG PO TABS: ORAL_TABLET | ORAL | Status: DC

## 2014-03-15 NOTE — Patient Instructions (Signed)
Back Pain, Adult Low back pain is very common. About 1 in 5 people have back pain.The cause of low back pain is rarely dangerous. The pain often gets better over time.About half of people with a sudden onset of back pain feel better in just 2 weeks. About 8 in 10 people feel better by 6 weeks.  CAUSES Some common causes of back pain include:  Strain of the muscles or ligaments supporting the spine.  Wear and tear (degeneration) of the spinal discs.  Arthritis.  Direct injury to the back. DIAGNOSIS Most of the time, the direct cause of low back pain is not known.However, back pain can be treated effectively even when the exact cause of the pain is unknown.Answering your caregiver's questions about your overall health and symptoms is one of the most accurate ways to make sure the cause of your pain is not dangerous. If your caregiver needs more information, he or she may order lab work or imaging tests (X-rays or MRIs).However, even if imaging tests show changes in your back, this usually does not require surgery. HOME CARE INSTRUCTIONS For many people, back pain returns.Since low back pain is rarely dangerous, it is often a condition that people can learn to manageon their own.   Remain active. It is stressful on the back to sit or stand in one place. Do not sit, drive, or stand in one place for more than 30 minutes at a time. Take short walks on level surfaces as soon as pain allows.Try to increase the length of time you walk each day.  Do not stay in bed.Resting more than 1 or 2 days can delay your recovery.  Do not avoid exercise or work.Your body is made to move.It is not dangerous to be active, even though your back may hurt.Your back will likely heal faster if you return to being active before your pain is gone.  Pay attention to your body when you bend and lift. Many people have less discomfortwhen lifting if they bend their knees, keep the load close to their bodies,and  avoid twisting. Often, the most comfortable positions are those that put less stress on your recovering back.  Find a comfortable position to sleep. Use a firm mattress and lie on your side with your knees slightly bent. If you lie on your back, put a pillow under your knees.  Only take over-the-counter or prescription medicines as directed by your caregiver. Over-the-counter medicines to reduce pain and inflammation are often the most helpful.Your caregiver may prescribe muscle relaxant drugs.These medicines help dull your pain so you can more quickly return to your normal activities and healthy exercise.  Put ice on the injured area.  Put ice in a plastic bag.  Place a towel between your skin and the bag.  Leave the ice on for 15-20 minutes, 03-04 times a day for the first 2 to 3 days. After that, ice and heat may be alternated to reduce pain and spasms.  Ask your caregiver about trying back exercises and gentle massage. This may be of some benefit.  Avoid feeling anxious or stressed.Stress increases muscle tension and can worsen back pain.It is important to recognize when you are anxious or stressed and learn ways to manage it.Exercise is a great option. SEEK MEDICAL CARE IF:  You have pain that is not relieved with rest or medicine.  You have pain that does not improve in 1 week.  You have new symptoms.  You are generally not feeling well. SEEK   IMMEDIATE MEDICAL CARE IF:   You have pain that radiates from your back into your legs.  You develop new bowel or bladder control problems.  You have unusual weakness or numbness in your arms or legs.  You develop nausea or vomiting.  You develop abdominal pain.  You feel faint. Document Released: 02/04/2005 Document Revised: 08/06/2011 Document Reviewed: 06/08/2013 Exit[employer] Patient Information 2015 [employer]. This information is not intended to replace advice given to you by your health care provider. Make sure you  discuss any questions you have with your health care provider.  

## 2014-03-15 NOTE — Progress Notes (Signed)
   Subjective:    Patient ID: Albert Griffin, male    DOB: 09-10-1960, 54 y.o.   MRN: [id number]  HPI 54 y.o. male with history of lower back pain/surgery of L5 with Dr. Rita Ohara 2005 presents with lower back pain for 2 months. At lower lumbar and right buttocks without numbness/tingling down legs, pain down legs, bowel/bladder problems. No injury but bellieves it was aggravated after building his deck 2 months ago, worse with sitting for long time, better with lying down. He has had some dribbling/hesistanty with urination but denies incontinence. Had recent left bunion surgery with Dr. Mickeal Skinner, was given oxycodone and antinausea pills but they made him sick still so he stopped them. He has been taking tylenol. .    Review of Systems  Constitutional: Negative.  Negative for fever.  HENT: Negative.   Respiratory: Negative.   Cardiovascular: Negative.  Negative for chest pain.  Gastrointestinal: Negative.  Negative for abdominal pain.  Genitourinary: Negative.  Negative for dysuria.  Musculoskeletal: Positive for back pain and gait problem. Negative for myalgias, joint swelling, arthralgias, neck pain and neck stiffness.  Skin: Negative.   Neurological: Negative.  Negative for weakness, numbness and headaches.       Objective:   Physical Exam  Constitutional: He is oriented to person, place, and time. He appears well-developed.  HENT:  Head: Normocephalic and atraumatic.  Cardiovascular: Normal rate and regular rhythm.   Pulmonary/Chest: Effort normal and breath sounds normal.  Abdominal: Soft. Bowel sounds are normal. There is no tenderness. There is no rebound and no guarding.  Musculoskeletal:  Patient is able to ambulate well.   Gait is  antalgic. Straight leg raising with dorsiflexion negative bilaterally for radicular symptoms. Sensory exam in the legs are normal.  Knee reflexes are normal Ankle reflexes are normal Strength is normal and symmetric in arms and legs. There is  not SI tenderness to palpation.  There is notparaspinal muscle spasm.   There is not midline tenderness.   ROM of spine with normal flexion, extension, lateral range of motion to the right and left, and rotation to the right and left.    Neurological: He is alert and oriented to person, place, and time. He has normal reflexes. No cranial nerve deficit. Coordination normal.  Skin: Skin is warm and dry. No rash noted.      Assessment & Plan:  Lower back pain- negative straight leg Prednisone was prescribed,NSAIDs after RX, RICE, and exercise given With history of slightly elevated PSA will get Xray and urine If not better follow up in office or will refer to PT/orthopedics. Natural history and expected course discussed. Questions answered. Proper lifting, bending technique discussed. Stretching exercises discussed. Go to the Er if any significant pain, numbness, tingling, changes in bowel/bladder.

## 2014-03-16 LAB — URINALYSIS, ROUTINE W REFLEX MICROSCOPIC
BILIRUBIN URINE: NEGATIVE
Glucose, UA: NEGATIVE mg/dL
Hgb urine dipstick: NEGATIVE
KETONES UR: NEGATIVE mg/dL
Leukocytes, UA: NEGATIVE
NITRITE: NEGATIVE
PROTEIN: NEGATIVE mg/dL
Specific Gravity, Urine: 1.016 (ref 1.005–1.030)
UROBILINOGEN UA: 0.2 mg/dL (ref 0.0–1.0)
pH: 7 (ref 5.0–8.0)

## 2014-03-16 LAB — URINE CULTURE
Colony Count: NO GROWTH
ORGANISM ID, BACTERIA: NO GROWTH

## 2014-03-22 IMAGING — CR DG CHEST 1V PORT
1 series · 1 of 1 positions shown · non-contrast
Comparison: 12/31/2008

CLINICAL DATA: Near-syncope.  Hypertension.

CHEST - 1 VIEW

[view not recorded]
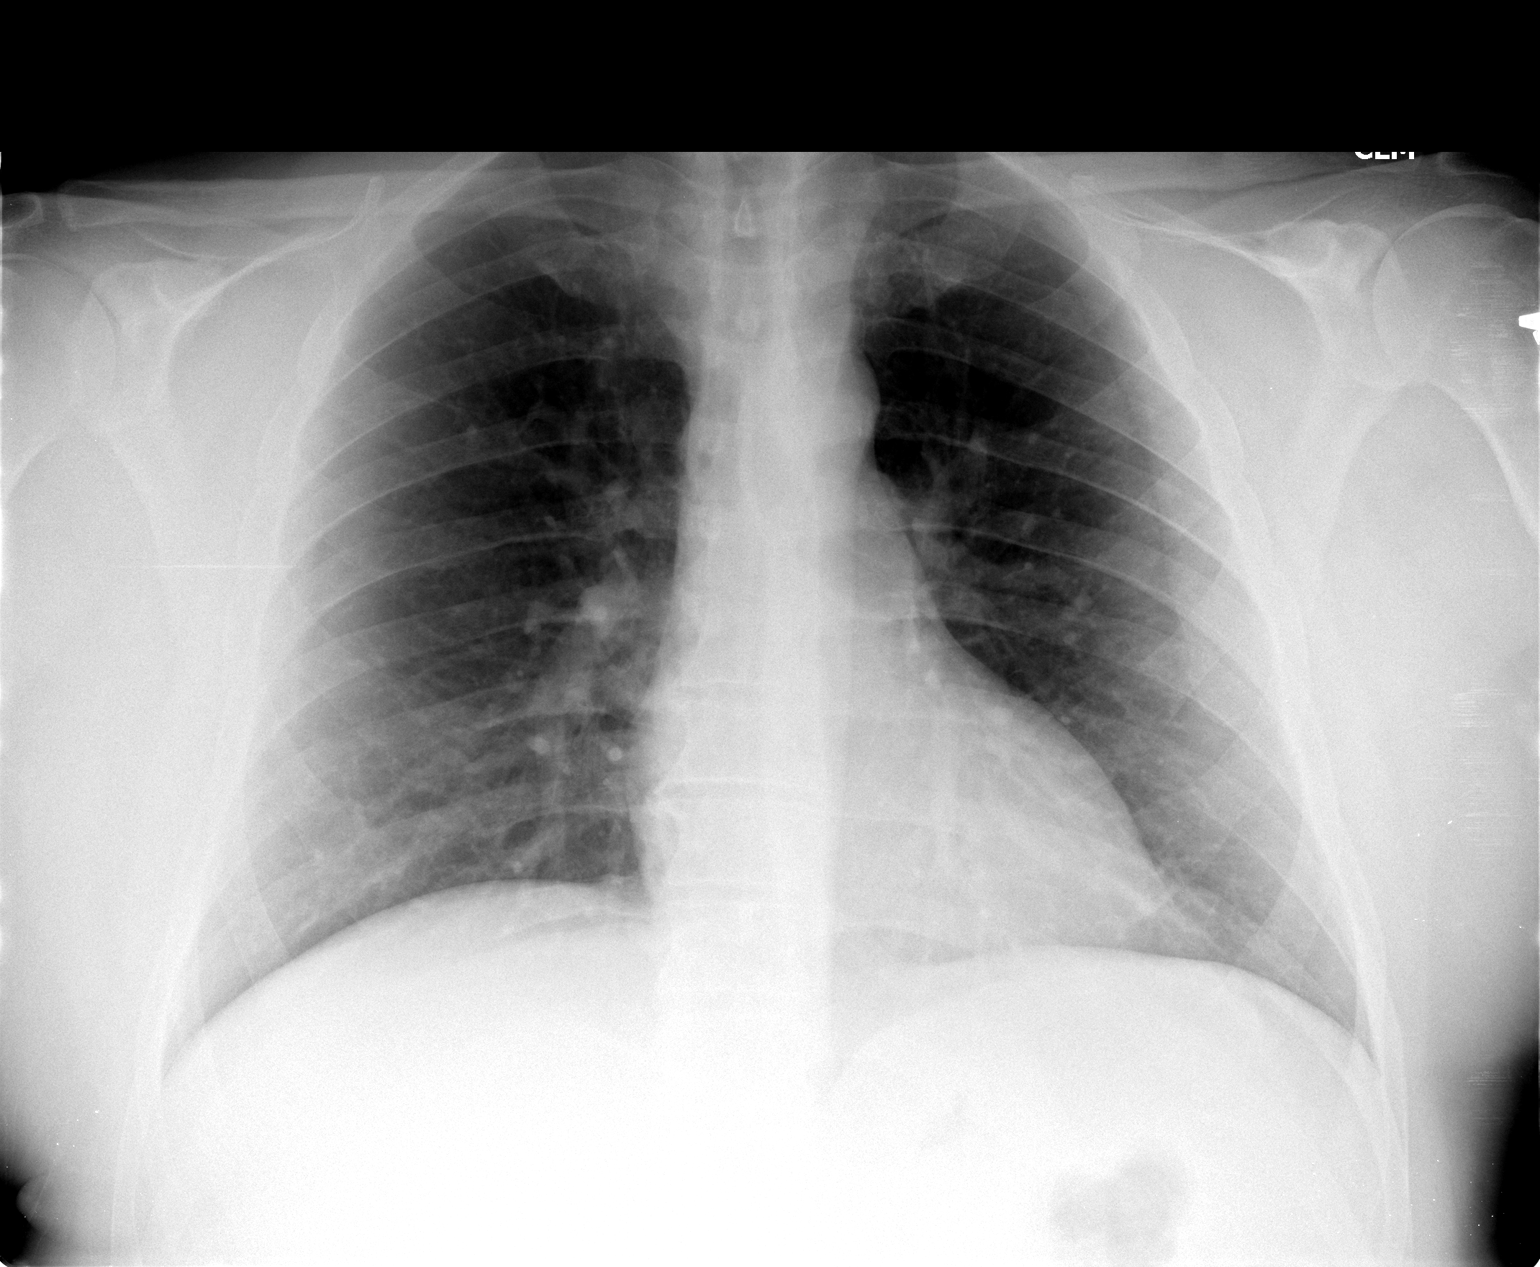

[1 of 1 positions shown; findings below may reference images not displayed]

FINDINGS: The heart size and mediastinal contours are within normal
limits.  Both lungs are clear.
IMPRESSION: No active disease.

## 2014-03-24 ENCOUNTER — Other Ambulatory Visit: Payer: Self-pay | Admitting: Physician Assistant

## 2014-04-20 ENCOUNTER — Encounter: Payer: Self-pay | Admitting: Physician Assistant

## 2014-04-21 MED ORDER — PREDNISONE 20 MG PO TABS: ORAL_TABLET | ORAL | Status: DC

## 2014-05-31 ENCOUNTER — Other Ambulatory Visit: Payer: Self-pay | Admitting: Internal Medicine

## 2014-06-28 ENCOUNTER — Other Ambulatory Visit: Payer: Self-pay | Admitting: Physician Assistant

## 2014-09-25 ENCOUNTER — Other Ambulatory Visit: Payer: Self-pay | Admitting: Physician Assistant

## 2014-10-27 ENCOUNTER — Telehealth: Payer: Self-pay | Admitting: *Deleted

## 2014-10-27 NOTE — Telephone Encounter (Signed)
SPOKE WITH  PT  RE  THE NEED  TO  POSSIBLY  HAVE  REPEAT   CHEST  CT  TO F/U ON LUNG NODULE   PT  HAS UPCOMING APPT  WITH DR [facility]  WILL DISCUSS  AT THAT  VISIT .Adonis Housekeeper

## 2014-11-11 ENCOUNTER — Ambulatory Visit
Admission: RE | Admit: 2014-11-11 | Discharge: 2014-11-11 | Disposition: A | Discharge status: Home / Self Care | Payer: 59 | Source: Ambulatory Visit | Attending: Physician Assistant | Admitting: Physician Assistant

## 2014-11-11 ENCOUNTER — Encounter: Payer: Self-pay | Admitting: Physician Assistant

## 2014-11-11 ENCOUNTER — Ambulatory Visit (INDEPENDENT_AMBULATORY_CARE_PROVIDER_SITE_OTHER): Payer: 59 | Admitting: Physician Assistant

## 2014-11-11 VITALS — BP 130/80 | HR 74 | Temp 97.8°F | Resp 16 | Ht 70.0 in | Wt 256.0 lb

## 2014-11-11 DIAGNOSIS — R7303 Prediabetes: Secondary | ICD-10-CM

## 2014-11-11 DIAGNOSIS — I10 Essential (primary) hypertension: Secondary | ICD-10-CM | POA: Diagnosis not present

## 2014-11-11 DIAGNOSIS — R911 Solitary pulmonary nodule: Secondary | ICD-10-CM

## 2014-11-11 DIAGNOSIS — IMO0001 Reserved for inherently not codable concepts without codable children: Secondary | ICD-10-CM

## 2014-11-11 DIAGNOSIS — M51369 Other intervertebral disc degeneration, lumbar region without mention of lumbar back pain or lower extremity pain: Secondary | ICD-10-CM

## 2014-11-11 DIAGNOSIS — Z0000 Encounter for general adult medical examination without abnormal findings: Secondary | ICD-10-CM

## 2014-11-11 DIAGNOSIS — K760 Fatty (change of) liver, not elsewhere classified: Secondary | ICD-10-CM

## 2014-11-11 DIAGNOSIS — K219 Gastro-esophageal reflux disease without esophagitis: Secondary | ICD-10-CM

## 2014-11-11 DIAGNOSIS — Z0001 Encounter for general adult medical examination with abnormal findings: Secondary | ICD-10-CM

## 2014-11-11 DIAGNOSIS — J302 Other seasonal allergic rhinitis: Secondary | ICD-10-CM

## 2014-11-11 DIAGNOSIS — Z79899 Other long term (current) drug therapy: Secondary | ICD-10-CM

## 2014-11-11 DIAGNOSIS — R972 Elevated prostate specific antigen [PSA]: Secondary | ICD-10-CM

## 2014-11-11 DIAGNOSIS — E669 Obesity, unspecified: Secondary | ICD-10-CM

## 2014-11-11 DIAGNOSIS — R7309 Other abnormal glucose: Secondary | ICD-10-CM | POA: Insufficient documentation

## 2014-11-11 DIAGNOSIS — E559 Vitamin D deficiency, unspecified: Secondary | ICD-10-CM

## 2014-11-11 DIAGNOSIS — D649 Anemia, unspecified: Secondary | ICD-10-CM

## 2014-11-11 DIAGNOSIS — M5136 Other intervertebral disc degeneration, lumbar region: Secondary | ICD-10-CM

## 2014-11-11 DIAGNOSIS — E538 Deficiency of other specified B group vitamins: Secondary | ICD-10-CM

## 2014-11-11 HISTORY — DX: Solitary pulmonary nodule: R91.1

## 2014-11-11 LAB — CBC WITH DIFFERENTIAL/PLATELET
BASOS ABS: 0.1 10*3/uL (ref 0.0–0.1)
Basophils Relative: 1 % (ref 0–1)
EOS ABS: 0.2 10*3/uL (ref 0.0–0.7)
Eosinophils Relative: 3 % (ref 0–5)
HCT: 45.3 % (ref 39.0–52.0)
Hemoglobin: 15.1 g/dL (ref 13.0–17.0)
LYMPHS PCT: 24 % (ref 12–46)
Lymphs Abs: 1.5 10*3/uL (ref 0.7–4.0)
MCH: 29.9 pg (ref 26.0–34.0)
MCHC: 33.3 g/dL (ref 30.0–36.0)
MCV: 89.7 fL (ref 78.0–100.0)
MPV: 10.6 fL (ref 8.6–12.4)
Monocytes Absolute: 0.5 10*3/uL (ref 0.1–1.0)
Monocytes Relative: 8 % (ref 3–12)
Neutro Abs: 4.1 10*3/uL (ref 1.7–7.7)
Neutrophils Relative %: 64 % (ref 43–77)
PLATELETS: 235 10*3/uL (ref 150–400)
RBC: 5.05 MIL/uL (ref 4.22–5.81)
RDW: 13.8 % (ref 11.5–15.5)
WBC: 6.4 10*3/uL (ref 4.0–10.5)

## 2014-11-11 NOTE — Patient Instructions (Signed)
I think it is possible that you have sleep apnea. It can cause interrupted sleep, headaches, frequent awakenings, fatigue, dry mouth, fast/slow heart beats, memory issues, anxiety/depression, swelling, numbness tingling hands/feet, weight gain, shortness of breath, and the list goes on. Sleep apnea needs to be ruled out because if it is left untreated it does eventually lead to abnormal heart beats, lung failure or heart failure as well as increasing the risk of heart attack and stroke. There are masks you can wear OR a mouth piece that I can give you information about. Often times though people feel MUCH better after getting treatment.   Sleep Apnea  Sleep apnea is a sleep disorder characterized by abnormal pauses in breathing while you sleep. When your breathing pauses, the level of oxygen in your blood decreases. This causes you to move out of deep sleep and into light sleep. As a result, your quality of sleep is poor, and the system that carries your blood throughout your body (cardiovascular system) experiences stress. If sleep apnea remains untreated, the following conditions can develop:  High blood pressure (hypertension).  Coronary artery disease.  Inability to achieve or maintain an erection (impotence).  Impairment of your thought process (cognitive dysfunction). There are three types of sleep apnea: 1. Obstructive sleep apnea--Pauses in breathing during sleep because of a blocked airway. 2. Central sleep apnea--Pauses in breathing during sleep because the area of the brain that controls your breathing does not send the correct signals to the muscles that control breathing. 3. Mixed sleep apnea--A combination of both obstructive and central sleep apnea.  RISK FACTORS The following risk factors can increase your risk of developing sleep apnea:  Being overweight.  Smoking.  Having narrow passages in your nose and throat.  Being of older age.  Being male.  Alcohol use.   Sedative and tranquilizer use.  Ethnicity. Among individuals younger than 35 years, African Americans are at increased risk of sleep apnea. SYMPTOMS   Difficulty staying asleep.  Daytime sleepiness and fatigue.  Loss of energy.  Irritability.  Loud, heavy snoring.  Morning headaches.  Trouble concentrating.  Forgetfulness.  Decreased interest in sex. DIAGNOSIS  In order to diagnose sleep apnea, your caregiver will perform a physical examination. Your caregiver may suggest that you take a home sleep test. Your caregiver may also recommend that you spend the night in a sleep lab. In the sleep lab, several monitors record information about your heart, lungs, and brain while you sleep. Your leg and arm movements and blood oxygen level are also recorded. TREATMENT The following actions may help to resolve mild sleep apnea:  Sleeping on your side.   Using a decongestant if you have nasal congestion.   Avoiding the use of depressants, including alcohol, sedatives, and narcotics.   Losing weight and modifying your diet if you are overweight. There also are devices and treatments to help open your airway:  Oral appliances. These are custom-made mouthpieces that shift your lower jaw forward and slightly open your bite. This opens your airway.  Devices that create positive airway pressure. This positive pressure "splints" your airway open to help you breathe better during sleep. The following devices create positive airway pressure:  Continuous positive airway pressure (CPAP) device. The CPAP device creates a continuous level of air pressure with an air pump. The air is delivered to your airway through a mask while you sleep. This continuous pressure keeps your airway open.  Nasal expiratory positive airway pressure (EPAP) device. The EPAP device  creates positive air pressure as you exhale. The device consists of single-use valves, which are inserted into each nostril and held in  place by adhesive. The valves create very little resistance when you inhale but create much more resistance when you exhale. That increased resistance creates the positive airway pressure. This positive pressure while you exhale keeps your airway open, making it easier to breath when you inhale again.  Bilevel positive airway pressure (BPAP) device. The BPAP device is used mainly in patients with central sleep apnea. This device is similar to the CPAP device because it also uses an air pump to deliver continuous air pressure through a mask. However, with the BPAP machine, the pressure is set at two different levels. The pressure when you exhale is lower than the pressure when you inhale.  Surgery. Typically, surgery is only done if you cannot comply with less invasive treatments or if the less invasive treatments do not improve your condition. Surgery involves removing excess tissue in your airway to create a wider passage way. Document Released: 01/25/2002 Document Revised: 06/01/2012 Document Reviewed: 06/13/2011 [facility] Patient Information 2015 [place], [place]. This information is not intended to replace advice given to you by your health care provider. Make sure you discuss any questions you have with your health care provider.     Preventive Care for Adults  A healthy lifestyle and preventive care can promote health and wellness. Preventive health guidelines for men include the following key practices:  A routine yearly physical is a good way to check with your health care provider about your health and preventative screening. It is a chance to share any concerns and updates on your health and to receive a thorough exam.  Visit your dentist for a routine exam and preventative care every 6 months. Brush your teeth twice a day and floss once a day. Good oral hygiene prevents tooth decay and gum disease.  The frequency of eye exams is based on your age, health, family medical history, use of  contact lenses, and other factors. Follow your health care provider's recommendations for frequency of eye exams.  Eat a healthy diet. Foods such as vegetables, fruits, whole grains, low-fat dairy products, and lean protein foods contain the nutrients you need without too many calories. Decrease your intake of foods high in solid fats, added sugars, and salt. Eat the right amount of calories for you.Get information about a proper diet from your health care provider, if necessary.  Regular physical exercise is one of the most important things you can do for your health. Most adults should get at least 150 minutes of moderate-intensity exercise (any activity that increases your heart rate and causes you to sweat) each week. In addition, most adults need muscle-strengthening exercises on 2 or more days a week.  Maintain a healthy weight. The body mass index (BMI) is a screening tool to identify possible weight problems. It provides an estimate of body fat based on height and weight. Your health care provider can find your BMI and can help you achieve or maintain a healthy weight.For adults 20 years and older:  A BMI below 18.5 is considered underweight.  A BMI of 18.5 to 24.9 is normal.  A BMI of 25 to 29.9 is considered overweight.  A BMI of 30 and above is considered obese.  Maintain normal blood lipids and cholesterol levels by exercising and minimizing your intake of saturated fat. Eat a balanced diet with plenty of fruit and vegetables. Blood tests for lipids  and cholesterol should begin at age 25 and be repeated every 5 years. If your lipid or cholesterol levels are high, you are over 50, or you are at high risk for heart disease, you may need your cholesterol levels checked more frequently.Ongoing high lipid and cholesterol levels should be treated with medicines if diet and exercise are not working.  If you smoke, find out from your health care provider how to quit. If you do not use  tobacco, do not start.  Lung cancer screening is recommended for adults aged 13-80 years who are at high risk for developing lung cancer because of a history of smoking. A yearly low-dose CT scan of the lungs is recommended for people who have at least a 30-pack-year history of smoking and are a current smoker or have quit within the past 15 years. A pack year of smoking is smoking an average of 1 pack of cigarettes a day for 1 year (for example: 1 pack a day for 30 years or 2 packs a day for 15 years). Yearly screening should continue until the smoker has stopped smoking for at least 15 years. Yearly screening should be stopped for people who develop a health problem that would prevent them from having lung cancer treatment.  If you choose to drink alcohol, do not have more than 2 drinks per day. One drink is considered to be 12 ounces (355 mL) of beer, 5 ounces (148 mL) of wine, or 1.5 ounces (44 mL) of liquor.  Avoid use of street drugs. Do not share needles with anyone. Ask for help if you need support or instructions about stopping the use of drugs.  High blood pressure causes heart disease and increases the risk of stroke. Your blood pressure should be checked at least every 1-2 years. Ongoing high blood pressure should be treated with medicines, if weight loss and exercise are not effective.  If you are 76-48 years old, ask your health care provider if you should take aspirin to prevent heart disease.  Diabetes screening involves taking a blood sample to check your fasting blood sugar level. This should be done once every 3 years, after age 83, if you are within normal weight and without risk factors for diabetes. Testing should be considered at a younger age or be carried out more frequently if you are overweight and have at least 1 risk factor for diabetes.  Colorectal cancer can be detected and often prevented. Most routine colorectal cancer screening begins at the age of 84 and continues  through age 30. However, your health care provider may recommend screening at an earlier age if you have risk factors for colon cancer. On a yearly basis, your health care provider may provide home test kits to check for hidden blood in the stool. Use of a small camera at the end of a tube to directly examine the colon (sigmoidoscopy or colonoscopy) can detect the earliest forms of colorectal cancer. Talk to your health care provider about this at age 62, when routine screening begins. Direct exam of the colon should be repeated every 5-10 years through age 52, unless early forms of precancerous polyps or small growths are found.   Talk with your health care provider about prostate cancer screening.  Testicular cancer screening isrecommended for adult males. Screening includes self-exam, a health care provider exam, and other screening tests. Consult with your health care provider about any symptoms you have or any concerns you have about testicular cancer.  Use sunscreen. Apply  sunscreen liberally and repeatedly throughout the day. You should seek shade when your shadow is shorter than you. Protect yourself by wearing long sleeves, pants, a wide-brimmed hat, and sunglasses year round, whenever you are outdoors.  Once a month, do a whole-body skin exam, using a mirror to look at the skin on your back. Tell your health care provider about new moles, moles that have irregular borders, moles that are larger than a pencil eraser, or moles that have changed in shape or color.  Stay current with required vaccines (immunizations).  Influenza vaccine. All adults should be immunized every year.  Tetanus, diphtheria, and acellular pertussis (Td, Tdap) vaccine. An adult who has not previously received Tdap or who does not know his vaccine status should receive 1 dose of Tdap. This initial dose should be followed by tetanus and diphtheria toxoids (Td) booster doses every 10 years. Adults with an unknown or  incomplete history of completing a 3-dose immunization series with Td-containing vaccines should begin or complete a primary immunization series including a Tdap dose. Adults should receive a Td booster every 10 years.  Varicella vaccine. An adult without evidence of immunity to varicella should receive 2 doses or a second dose if he has previously received 1 dose.  Human papillomavirus (HPV) vaccine. Males aged 37-21 years who have not received the vaccine previously should receive the 3-dose series. Males aged 22-26 years may be immunized. Immunization is recommended through the age of 31 years for any male who has sex with males and did not get any or all doses earlier. Immunization is recommended for any person with an immunocompromised condition through the age of 37 years if he did not get any or all doses earlier. During the 3-dose series, the second dose should be obtained 4-8 weeks after the first dose. The third dose should be obtained 24 weeks after the first dose and 16 weeks after the second dose.  Zoster vaccine. One dose is recommended for adults aged 5 years or older unless certain conditions are present.    PREVNAR  - Pneumococcal 13-valent conjugate (PCV13) vaccine. When indicated, a person who is uncertain of his immunization history and has no record of immunization should receive the PCV13 vaccine. An adult aged 50 years or older who has certain medical conditions and has not been previously immunized should receive 1 dose of PCV13 vaccine. This PCV13 should be followed with a dose of pneumococcal polysaccharide (PPSV23) vaccine. The PPSV23 vaccine dose should be obtained at least 8 weeks after the dose of PCV13 vaccine. An adult aged 81 years or older who has certain medical conditions and previously received 1 or more doses of PPSV23 vaccine should receive 1 dose of PCV13. The PCV13 vaccine dose should be obtained 1 or more years after the last PPSV23 vaccine dose.    PNEUMOVAX  - Pneumococcal polysaccharide (PPSV23) vaccine. When PCV13 is also indicated, PCV13 should be obtained first. All adults aged 65 years and older should be immunized. An adult younger than age 44 years who has certain medical conditions should be immunized. Any person who resides in a nursing home or long-term care facility should be immunized. An adult smoker should be immunized. People with an immunocompromised condition and certain other conditions should receive both PCV13 and PPSV23 vaccines. People with human immunodeficiency virus (HIV) infection should be immunized as soon as possible after diagnosis. Immunization during chemotherapy or radiation therapy should be avoided. Routine use of PPSV23 vaccine is not recommended for American Indians,  [place], or people younger than 65 years unless there are medical conditions that require PPSV23 vaccine. When indicated, people who have unknown immunization and have no record of immunization should receive PPSV23 vaccine. One-time revaccination 5 years after the first dose of PPSV23 is recommended for people aged 19-64 years who have chronic kidney failure, nephrotic syndrome, asplenia, or immunocompromised conditions. People who received 1-2 doses of PPSV23 before age 25 years should receive another dose of PPSV23 vaccine at age 56 years or later if at least 5 years have passed since the previous dose. Doses of PPSV23 are not needed for people immunized with PPSV23 at or after age 67 years.    Hepatitis A vaccine. Adults who wish to be protected from this disease, have certain high-risk conditions, work with hepatitis A-infected animals, work in hepatitis A research labs, or travel to or work in countries with a high rate of hepatitis A should be immunized. Adults who were previously unvaccinated and who anticipate close contact with an international adoptee during the first 60 days after arrival in the [place] from a country with a high rate of  hepatitis A should be immunized.    Hepatitis B vaccine. Adults should be immunized if they wish to be protected from this disease, have certain high-risk conditions, may be exposed to blood or other infectious body fluids, are household contacts or sex partners of hepatitis B positive people, are clients or workers in certain care facilities, or travel to or work in countries with a high rate of hepatitis B.   Preventive Service / Frequency   Ages 16 to 29  Blood pressure check.  Lipid and cholesterol check  Lung cancer screening. / Every year if you are aged 49-80 years and have a 30-pack-year history of smoking and currently smoke or have quit within the past 15 years. Yearly screening is stopped once you have quit smoking for at least 15 years or develop a health problem that would prevent you from having lung cancer treatment.  Fecal occult blood test (FOBT) of stool. / Every year beginning at age 93 and continuing until age 41. You may not have to do this test if you get a colonoscopy every 10 years.  Flexible sigmoidoscopy** or colonoscopy.** / Every 5 years for a flexible sigmoidoscopy or every 10 years for a colonoscopy beginning at age 62 and continuing until age 88. Screening for abdominal aortic aneurysm (AAA)  by ultrasound is recommended for people who have history of high blood pressure or who are current or former smokers.

## 2014-11-11 NOTE — Progress Notes (Signed)
Complete Physical  Assessment and Plan: 1. Essential hypertension - continue medications, DASH diet, exercise and monitor at home. Call if greater than 130/80.  - CBC with Differential/Platelet - BASIC METABOLIC PANEL WITH GFR - TSH - Urinalysis, Routine w reflex microscopic (not at [facility]) - Microalbumin / creatinine urine ratio - EKG 12-Lead  2. Fatty liver Check labs, avoid tylenol, alcohol, weight loss advised.  - Hepatic function panel  3. Obesity Obesity with co morbidities- long discussion about weight loss, diet, and exercise - Lipid panel - Testosterone  4. Vitamin B12 deficiency Continue supplement  5. Gastroesophageal reflux disease without esophagitis Continue PPI/H2 blocker, diet discussed  6. Anemia, unspecified anemia type - monitor, continue iron supp with Vitamin C and increase green leafy veggies  7. Other seasonal allergic rhinitis Continue OTC allergy pills  8. DDD (degenerative disc disease), lumbar RICE, NSAIDS, exercises given, if not better get xray and PT referral or ortho referral.  - Uric acid  9. Elevated prostate specific antigen (PSA) - PSA  10. Encounter for general adult medical examination with abnormal findings - CBC with Differential/Platelet - BASIC METABOLIC PANEL WITH GFR - Hepatic function panel - TSH - Lipid panel - Hemoglobin A1c - Insulin, fasting - Magnesium - Vit D  25 hydroxy (rtn osteoporosis monitoring) - Urinalysis, Routine w reflex microscopic (not at [facility]) - Microalbumin / creatinine urine ratio - PSA - Testosterone - Uric acid - EKG 12-Lead - CT Chest Wo Contrast; Future  11. Prediabetes Discussed general issues about diabetes pathophysiology and management., Educational material distributed., Suggested low cholesterol diet., Encouraged aerobic exercise., Discussed foot care., Reminded to get yearly retinal exam. - Hemoglobin A1c - Insulin, fasting  12. Medication management - Magnesium  13. Vitamin D  deficiency - Vit D  25 hydroxy (rtn osteoporosis monitoring)  14. Lung nodule < 6cm on CT Had lung nodule on cardiac calcium scoring, will get repeat CT scan.  - CT Chest Wo Contrast; Future   Discussed med's effects and SE's. Screening labs and tests as requested with regular follow-up as recommended.  HPI  His blood pressure has been controlled at home but he did have an exaggerated BP response while on treadmill, today their BP is BP: 130/80 mmHg He does not workout, but he is very active. He denies dizziness. Has SOB and some CP with walking up stairs, but has been in heat. He had recent normal cardiac work up.  He is not on cholesterol medication and denies myalgias. His cholesterol is at goal. The cholesterol last visit was:   Lab Results  Component Value Date   CHOL 175 11/10/2013   HDL 55 11/10/2013   LDLCALC 98 11/10/2013   TRIG 109 11/10/2013   CHOLHDL 3.2 11/10/2013    He has been working on diet and exercise for prediabetes, and denies paresthesia of the feet, polydipsia, polyuria and visual disturbances. Last A1C in the office was:  Lab Results  Component Value Date   HGBA1C 5.9* 11/10/2013   Last PSA was 4.46 and got better with ABX became 2.14, he is complaining of hesitancy and dribbling.   Lab Results  Component Value Date   PSA 2.48 11/10/2013   Patient is on Vitamin D supplement.   Lab Results  Component Value Date   VD25OH 45 11/10/2013   He has lower back pain, has left side worse than right. Has some left anterior hip pain with walking.  BMI is Body mass index is 36.73 kg/(m^2)., he states that he  can not get motivated. He does drink heavily during the summer but states he has not drank as much before.  Wt Readings from Last 3 Encounters:  11/11/14 256 lb (116.121 kg)  03/15/14 247 lb (112.038 kg)  11/10/13 264 lb (119.75 kg)    Current Medications:    Medication List       This list is accurate as of: 11/11/14 10:00 AM.  Always use your most  recent med list.               DEXILANT 60 MG capsule  Generic drug:  dexlansoprazole  TAKE 1 CAPSULE BY MOUTH EVERY DAY     fexofenadine 180 MG tablet  Commonly known as:  ALLEGRA  Take 180 mg by mouth daily.     hydrocortisone-pramoxine 2.5-1 % rectal cream  Commonly known as:  ANALPRAM-HC  Place 1 application rectally 3 (three) times daily.     losartan 100 MG tablet  Commonly known as:  COZAAR  TAKE 1 TABLET BY MOUTH EVERY DAY       Health Maintenance:  Immunization History  Administered Date(s) Administered  . Pneumococcal-Unspecified 02/19/1995  . Tdap 10/07/2012   Tetanus: 2014 Pneumovax: 1997 Prevnar 13: due age 25 Flu vaccine: declines Zostavax: N/A DEXA: Colonoscopy:06/2011 EGD: Stress test 10/2013 CT cardiac score 10/2013 normal Echo 2013  Allergies:  Allergies  Allergen Reactions  . Iodides Hives   Medical History:  Past Medical History  Diagnosis Date  . Hypertension   . Obesity   . Chest pain   . Tendonitis   . GERD (gastroesophageal reflux disease)   . Vitamin B12 deficiency   . GERD (gastroesophageal reflux disease) 08/01/2013  . Allergic rhinitis   . Anemia    Surgical History:  Past Surgical History  Procedure Laterality Date  . Back surgery    . Bunionectomy Left 02/14/2014   Family History:  Family History  Problem Relation Age of Onset  . Lung cancer Mother   . Breast cancer Mother   . Esophageal cancer Father   . Heart disease Sister   . Heart disease Maternal Uncle    Social History:   Social History  Substance Use Topics  . Smoking status: Never Smoker   . Smokeless tobacco: Former Systems developer    Types: Chew    Quit date: 11/10/2000  . Alcohol Use: 0.0 oz/week    0 Standard drinks or equivalent per week     Comment: more during the summer and less after labor day   Review of Systems  Constitutional: Negative.  Negative for fever.  HENT: Negative.   Respiratory: Positive for shortness of breath. Negative for  cough, hemoptysis, sputum production and wheezing.   Cardiovascular: Negative.  Negative for chest pain.  Gastrointestinal: Negative.  Negative for abdominal pain.  Genitourinary: Positive for urgency and frequency. Negative for dysuria, hematuria and flank pain.  Musculoskeletal: Positive for back pain and joint pain. Negative for myalgias, falls and neck pain.  Skin: Negative.   Neurological: Negative.  Negative for tingling, weakness and headaches.  Psychiatric/Behavioral: Negative for depression, suicidal ideas, hallucinations, memory loss and substance abuse. The patient is not nervous/anxious and does not have insomnia.      Physical Exam: Estimated body mass index is 36.73 kg/(m^2) as calculated from the following:   Height as of this encounter: 5\' 10"  (1.778 m).   Weight as of this encounter: 256 lb (116.121 kg). BP 130/80 mmHg  Pulse 74  Temp(Src) 97.8 F (36.6 C) (Temporal)  Resp 16  Ht 5\' 10"  (1.778 m)  Wt 256 lb (116.121 kg)  BMI 36.73 kg/m2 General Appearance: Well nourished, in no apparent distress. Eyes: PERRLA, EOMs, conjunctiva no swelling or erythema, normal fundi and vessels. Sinuses: No Frontal/maxillary tenderness ENT/Mouth: Ext aud canals clear, normal light reflex with TMs without erythema, bulging. Good dentition. No erythema, swelling, or exudate on post pharynx. Tonsils not swollen or erythematous. Hearing normal.  Neck: Supple, thyroid normal. No bruits Respiratory: Respiratory effort normal, BS equal bilaterally without rales, rhonchi, wheezing or stridor. Cardio: RRR without murmurs, rubs or gallops. Brisk peripheral pulses without edema.  Chest: symmetric, with normal excursions and percussion. Abdomen: Soft, +BS. Non tender, no guarding, rebound, hernias, masses, or organomegaly.  Lymphatics: Non tender without lymphadenopathy.  Genitourinary: defer Musculoskeletal: Full ROM all peripheral extremities,5/5 strength, and normal gait. Skin: Warm, dry  without rashes, lesions, ecchymosis.  Neuro: Cranial nerves intact, reflexes equal bilaterally. Normal muscle tone, no cerebellar symptoms. Sensation intact.  Psych: Awake and oriented X 3, normal affect, Insight and Judgment appropriate.   EKG: WNL, no ST changes AORTA SCAN: defer   [name] 10:00 AM

## 2014-11-12 LAB — BASIC METABOLIC PANEL WITH GFR
BUN: 17 mg/dL (ref 7–25)
CO2: 27 mmol/L (ref 20–31)
Calcium: 9.4 mg/dL (ref 8.6–10.3)
Chloride: 104 mmol/L (ref 98–110)
Creat: 1.06 mg/dL (ref 0.70–1.33)
GFR, Est Non African American: 80 mL/min (ref >=60)
Glucose, Bld: 91 mg/dL (ref 65–99)
POTASSIUM: 4.4 mmol/L (ref 3.5–5.3)
SODIUM: 140 mmol/L (ref 135–146)

## 2014-11-12 LAB — URINALYSIS, ROUTINE W REFLEX MICROSCOPIC
Bilirubin Urine: NEGATIVE
Glucose, UA: NEGATIVE
HGB URINE DIPSTICK: NEGATIVE
Ketones, ur: NEGATIVE
LEUKOCYTES UA: NEGATIVE
NITRITE: NEGATIVE
Protein, ur: NEGATIVE
Specific Gravity, Urine: 1.014 (ref 1.001–1.035)
pH: 7 (ref 5.0–8.0)

## 2014-11-12 LAB — LIPID PANEL
Cholesterol: 169 mg/dL (ref 125–200)
HDL: 50 mg/dL (ref >=40)
LDL Cholesterol: 102 mg/dL (ref <130)
TRIGLYCERIDES: 83 mg/dL (ref <150)
Total CHOL/HDL Ratio: 3.4 Ratio (ref <=5.0)
VLDL: 17 mg/dL (ref <30)

## 2014-11-12 LAB — VITAMIN D 25 HYDROXY (VIT D DEFICIENCY, FRACTURES): Vit D, 25-Hydroxy: 39 ng/mL (ref 30–100)

## 2014-11-12 LAB — HEMOGLOBIN A1C
Hgb A1c MFr Bld: 6 % — ABNORMAL HIGH (ref <5.7)
Mean Plasma Glucose: 126 mg/dL — ABNORMAL HIGH (ref <117)

## 2014-11-12 LAB — HEPATIC FUNCTION PANEL
ALBUMIN: 4.2 g/dL (ref 3.6–5.1)
ALK PHOS: 56 U/L (ref 40–115)
ALT: 36 U/L (ref 9–46)
AST: 26 U/L (ref 10–35)
BILIRUBIN INDIRECT: 0.7 mg/dL (ref 0.2–1.2)
BILIRUBIN TOTAL: 0.9 mg/dL (ref 0.2–1.2)
Bilirubin, Direct: 0.2 mg/dL (ref <=0.2)
Total Protein: 6.3 g/dL (ref 6.1–8.1)

## 2014-11-12 LAB — URIC ACID: Uric Acid, Serum: 6.5 mg/dL (ref 4.0–7.8)

## 2014-11-12 LAB — TESTOSTERONE: Testosterone: 354 ng/dL (ref 300–890)

## 2014-11-12 LAB — MICROALBUMIN / CREATININE URINE RATIO: CREATININE, URINE: 100.3 mg/dL

## 2014-11-12 LAB — INSULIN, FASTING: Insulin fasting, serum: 15.7 u[IU]/mL (ref 2.0–19.6)

## 2014-11-12 LAB — MAGNESIUM: MAGNESIUM: 2 mg/dL (ref 1.5–2.5)

## 2014-11-12 LAB — PSA: PSA: 2.13 ng/mL (ref <=4.00)

## 2014-11-12 LAB — TSH: TSH: 1.769 u[IU]/mL (ref 0.350–4.500)

## 2014-11-14 ENCOUNTER — Other Ambulatory Visit: Payer: 59

## 2015-02-18 ENCOUNTER — Encounter: Payer: Self-pay | Admitting: Physician Assistant

## 2015-03-26 ENCOUNTER — Other Ambulatory Visit: Payer: Self-pay | Admitting: Internal Medicine

## 2015-04-27 ENCOUNTER — Ambulatory Visit (INDEPENDENT_AMBULATORY_CARE_PROVIDER_SITE_OTHER): Payer: 59 | Admitting: Internal Medicine

## 2015-04-27 ENCOUNTER — Encounter: Payer: Self-pay | Admitting: Internal Medicine

## 2015-04-27 VITALS — BP 126/80 | HR 90 | Temp 98.2°F | Resp 16 | Ht 70.0 in | Wt 230.0 lb

## 2015-04-27 DIAGNOSIS — J069 Acute upper respiratory infection, unspecified: Secondary | ICD-10-CM | POA: Diagnosis not present

## 2015-04-27 DIAGNOSIS — R109 Unspecified abdominal pain: Secondary | ICD-10-CM | POA: Diagnosis not present

## 2015-04-27 LAB — POCT INFLUENZA A/B
INFLUENZA A, POC: NEGATIVE
INFLUENZA B, POC: NEGATIVE

## 2015-04-27 MED ORDER — AZITHROMYCIN 250 MG PO TABS: ORAL_TABLET | ORAL | Status: DC

## 2015-04-27 MED ORDER — PREDNISONE 20 MG PO TABS: ORAL_TABLET | ORAL | Status: DC

## 2015-04-27 MED ORDER — RANITIDINE HCL 300 MG PO TABS: 300.0000 mg | ORAL_TABLET | Freq: Every day | ORAL | Status: DC

## 2015-04-27 NOTE — Progress Notes (Signed)
Subjective:    Patient ID: Albert Griffin, male    DOB: [birth year], 55 y.o.   MRN: [id number]  Fever  Associated symptoms include congestion, coughing, ear pain, nausea and a sore throat. Pertinent negatives include no abdominal pain, diarrhea, urinary pain, vomiting or wheezing.  Otalgia  Associated symptoms include coughing, rhinorrhea and a sore throat. Pertinent negatives include no abdominal pain, diarrhea or vomiting.   Patient presents to the office for evaluation of fever x 3 days.  He reports that on Tuesday it was 101 F.  He did take some tylenol and also took some nyquil.  He reports that max temp was 102.5.  He thinks that the fever broke last night around 11 pm yesterday.  He reports that the fever is responsive to medications.  He has been having some bilateral flank pain and back pain.  He also reports that he has been having bilateral ear fullness and also bilateral neck gland pain.  He reports that he is having a dry cough.  He feels like his esophagus is raw.  He has had some runny nose and some congestion.  The rhinorrhea has been clear.     Review of Systems  Constitutional: Positive for fever, chills and fatigue.  HENT: Positive for congestion, ear pain, postnasal drip, rhinorrhea and sore throat. Negative for tinnitus and trouble swallowing.   Respiratory: Positive for cough. Negative for chest tightness, shortness of breath and wheezing.   Gastrointestinal: Positive for nausea and constipation. Negative for vomiting, abdominal pain, diarrhea, blood in stool and anal bleeding.  Genitourinary: Negative for dysuria, urgency, frequency and hematuria.       Objective:   Physical Exam  Constitutional: He is oriented to person, place, and time. He appears well-developed and well-nourished. No distress.  HENT:  Head: Normocephalic.  Mouth/Throat: Oropharynx is clear and moist. No oropharyngeal exudate.  Eyes: Conjunctivae are normal. No scleral icterus.  Neck: Normal  range of motion. Neck supple. No JVD present. No thyromegaly present.  Cardiovascular: Normal rate, regular rhythm, normal heart sounds and intact distal pulses.  Exam reveals no gallop and no friction rub.   No murmur heard. Pulmonary/Chest: Effort normal and breath sounds normal. No respiratory distress. He has no wheezes. He has no rales. He exhibits tenderness.  Abdominal: Soft. Bowel sounds are normal. He exhibits no distension and no mass. There is no tenderness. There is no rebound and no guarding.  Musculoskeletal: Normal range of motion.  Lymphadenopathy:    He has no cervical adenopathy.  Neurological: He is alert and oriented to person, place, and time.  Skin: Skin is warm and dry. He is not diaphoretic.  Psychiatric: He has a normal mood and affect. His behavior is normal. Judgment and thought content normal.  Nursing note and vitals reviewed.   Filed Vitals:   04/27/15 1617  BP: 126/80  Pulse: 90  Temp: 98.2 F (36.8 C)  Resp: 16         Assessment & Plan:    1. Flank pain -r/o UTI although no urinary symptoms - Urinalysis, Routine w reflex microscopic (not at [facility]) - Culture, Urine  2. Acute URI - azithromycin (ZITHROMAX Z-PAK) 250 MG tablet; 2 po day one, then 1 daily x 4 days  Dispense: 6 tablet; Refill: 0 - ranitidine (ZANTAC) 300 MG tablet; Take 1 tablet (300 mg total) by mouth at bedtime.  Dispense: 30 tablet; Refill: 1 - predniSONE (DELTASONE) 20 MG tablet; 3 tabs po day one, then 2  tabs daily x 4 days  Dispense: 11 tablet; Refill: 0  Negative rapid flu

## 2015-04-28 LAB — URINALYSIS, MICROSCOPIC ONLY
Bacteria, UA: NONE SEEN [HPF]
CASTS: NONE SEEN [LPF]
Crystals: NONE SEEN [HPF]
Squamous Epithelial / LPF: NONE SEEN [HPF] (ref <=5)
WBC UA: NONE SEEN WBC/HPF (ref <=5)
Yeast: NONE SEEN [HPF]

## 2015-04-28 LAB — URINALYSIS, ROUTINE W REFLEX MICROSCOPIC
BILIRUBIN URINE: NEGATIVE
Glucose, UA: NEGATIVE
HGB URINE DIPSTICK: NEGATIVE
Leukocytes, UA: NEGATIVE
NITRITE: NEGATIVE
Specific Gravity, Urine: 1.034 (ref 1.001–1.035)
pH: 6 (ref 5.0–8.0)

## 2015-04-29 LAB — URINE CULTURE
Colony Count: NO GROWTH
Organism ID, Bacteria: NO GROWTH

## 2015-05-02 ENCOUNTER — Encounter: Payer: Self-pay | Admitting: Physician Assistant

## 2015-05-02 ENCOUNTER — Other Ambulatory Visit: Payer: Self-pay | Admitting: Internal Medicine

## 2015-05-02 MED ORDER — HYDROCORTISONE ACE-PRAMOXINE 2.5-1 % RE CREA: 1.0000 "application " | TOPICAL_CREAM | Freq: Three times a day (TID) | RECTAL | Status: DC

## 2015-06-27 ENCOUNTER — Other Ambulatory Visit: Payer: Self-pay | Admitting: Internal Medicine

## 2015-07-28 ENCOUNTER — Other Ambulatory Visit: Payer: Self-pay | Admitting: Internal Medicine

## 2015-08-14 IMAGING — CR DG FINGER MIDDLE 2+V*L*
3 series · 3 of 3 positions shown · non-contrast
Comparison: None.

CLINICAL DATA: Cut middle phalanx of the left third digit today on
skill saw. Initial encounter.

EXAM:
LEFT MIDDLE FINGER 2+V

[finger ap]
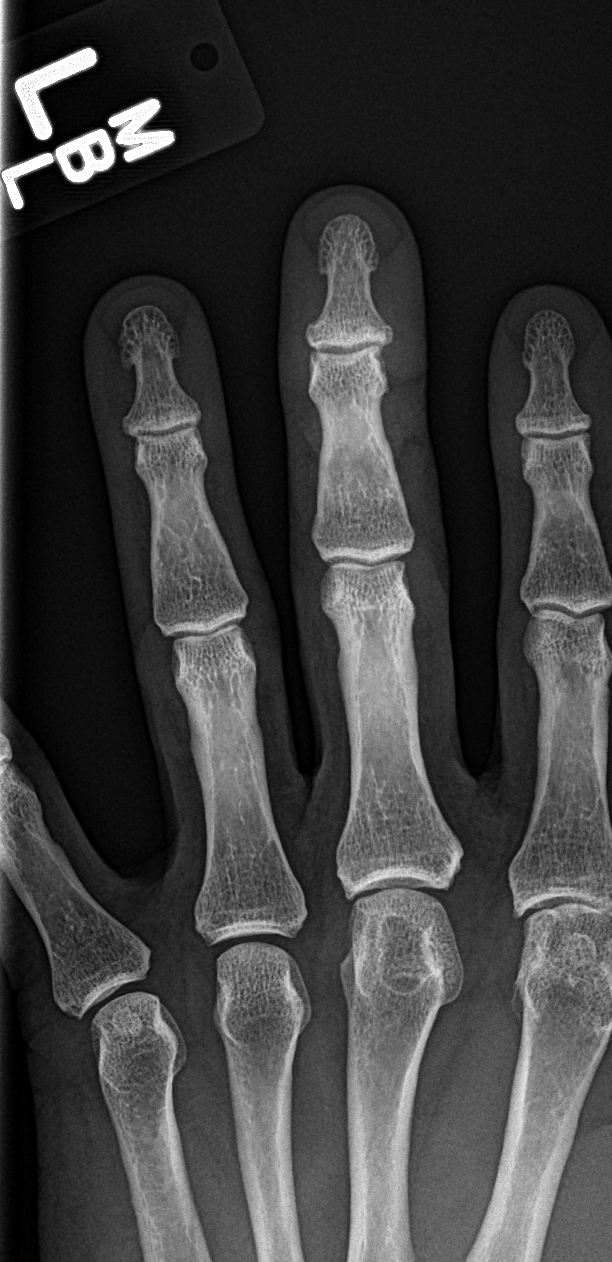

[finger lat]
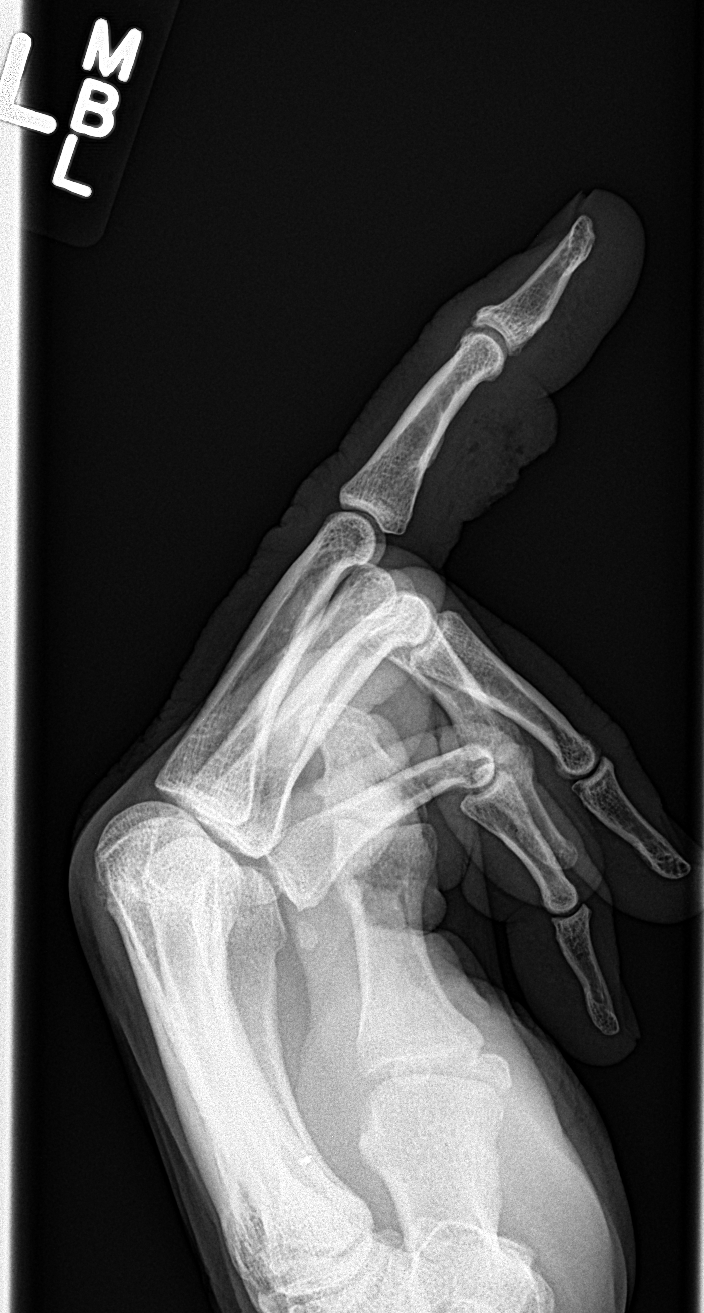

[finger obl]
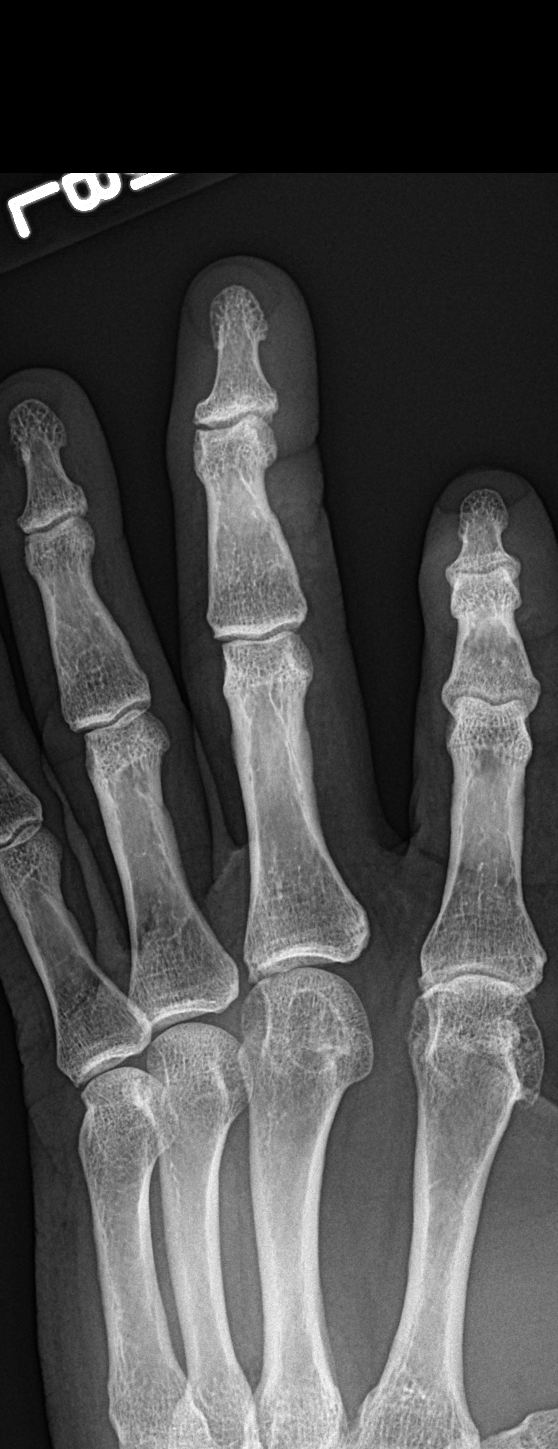

[3 of 3 positions shown; findings below may reference images not displayed]

FINDINGS: There is a soft tissue laceration about the palmar aspect of the
middle phalanx of the third digit. This finding is without
associated displaced fracture or radiopaque foreign body. Joint
spaces are preserved. No erosions.
IMPRESSION: Soft tissue laceration about the palmar aspect of the middle phalanx
of the third digit without associated fracture or radiopaque foreign
body.

## 2015-10-24 ENCOUNTER — Other Ambulatory Visit: Payer: Self-pay | Admitting: Internal Medicine

## 2015-11-17 ENCOUNTER — Encounter: Payer: Self-pay | Admitting: Physician Assistant

## 2015-11-17 ENCOUNTER — Ambulatory Visit (INDEPENDENT_AMBULATORY_CARE_PROVIDER_SITE_OTHER): Payer: 59 | Admitting: Physician Assistant

## 2015-11-17 VITALS — BP 122/68 | HR 54 | Temp 98.2°F | Resp 16 | Ht 68.75 in | Wt 220.0 lb

## 2015-11-17 DIAGNOSIS — Z79899 Other long term (current) drug therapy: Secondary | ICD-10-CM

## 2015-11-17 DIAGNOSIS — M5136 Other intervertebral disc degeneration, lumbar region: Secondary | ICD-10-CM | POA: Diagnosis not present

## 2015-11-17 DIAGNOSIS — I10 Essential (primary) hypertension: Secondary | ICD-10-CM

## 2015-11-17 DIAGNOSIS — M51369 Other intervertebral disc degeneration, lumbar region without mention of lumbar back pain or lower extremity pain: Secondary | ICD-10-CM

## 2015-11-17 DIAGNOSIS — Z0001 Encounter for general adult medical examination with abnormal findings: Secondary | ICD-10-CM | POA: Diagnosis not present

## 2015-11-17 DIAGNOSIS — Z136 Encounter for screening for cardiovascular disorders: Secondary | ICD-10-CM

## 2015-11-17 DIAGNOSIS — R7303 Prediabetes: Secondary | ICD-10-CM | POA: Diagnosis not present

## 2015-11-17 DIAGNOSIS — K760 Fatty (change of) liver, not elsewhere classified: Secondary | ICD-10-CM

## 2015-11-17 DIAGNOSIS — D649 Anemia, unspecified: Secondary | ICD-10-CM

## 2015-11-17 DIAGNOSIS — R972 Elevated prostate specific antigen [PSA]: Secondary | ICD-10-CM

## 2015-11-17 DIAGNOSIS — K219 Gastro-esophageal reflux disease without esophagitis: Secondary | ICD-10-CM

## 2015-11-17 DIAGNOSIS — R6889 Other general symptoms and signs: Secondary | ICD-10-CM

## 2015-11-17 DIAGNOSIS — E559 Vitamin D deficiency, unspecified: Secondary | ICD-10-CM

## 2015-11-17 DIAGNOSIS — R911 Solitary pulmonary nodule: Secondary | ICD-10-CM

## 2015-11-17 DIAGNOSIS — R5383 Other fatigue: Secondary | ICD-10-CM

## 2015-11-17 DIAGNOSIS — Z13220 Encounter for screening for lipoid disorders: Secondary | ICD-10-CM

## 2015-11-17 DIAGNOSIS — E538 Deficiency of other specified B group vitamins: Secondary | ICD-10-CM

## 2015-11-17 DIAGNOSIS — J302 Other seasonal allergic rhinitis: Secondary | ICD-10-CM | POA: Diagnosis not present

## 2015-11-17 DIAGNOSIS — E669 Obesity, unspecified: Secondary | ICD-10-CM

## 2015-11-17 LAB — CBC WITH DIFFERENTIAL/PLATELET
BASOS ABS: 59 {cells}/uL (ref 0–200)
Basophils Relative: 1 %
EOS PCT: 5 %
Eosinophils Absolute: 295 cells/uL (ref 15–500)
HCT: 46.6 % (ref 38.5–50.0)
HEMOGLOBIN: 16.2 g/dL (ref 13.2–17.1)
LYMPHS ABS: 1652 {cells}/uL (ref 850–3900)
Lymphocytes Relative: 28 %
MCH: 31.2 pg (ref 27.0–33.0)
MCHC: 34.8 g/dL (ref 32.0–36.0)
MCV: 89.8 fL (ref 80.0–100.0)
MPV: 10.8 fL (ref 7.5–12.5)
Monocytes Absolute: 472 cells/uL (ref 200–950)
Monocytes Relative: 8 %
NEUTROS ABS: 3422 {cells}/uL (ref 1500–7800)
Neutrophils Relative %: 58 %
Platelets: 228 10*3/uL (ref 140–400)
RBC: 5.19 MIL/uL (ref 4.20–5.80)
RDW: 13.4 % (ref 11.0–15.0)
WBC: 5.9 10*3/uL (ref 3.8–10.8)

## 2015-11-17 LAB — LIPID PANEL
CHOL/HDL RATIO: 3.2 ratio (ref <=5.0)
Cholesterol: 170 mg/dL (ref 125–200)
HDL: 53 mg/dL (ref >=40)
LDL CALC: 102 mg/dL (ref <130)
Triglycerides: 77 mg/dL (ref <150)
VLDL: 15 mg/dL (ref <30)

## 2015-11-17 LAB — BASIC METABOLIC PANEL WITH GFR
BUN: 17 mg/dL (ref 7–25)
CHLORIDE: 104 mmol/L (ref 98–110)
CO2: 27 mmol/L (ref 20–31)
CREATININE: 0.97 mg/dL (ref 0.70–1.33)
Calcium: 9.7 mg/dL (ref 8.6–10.3)
GFR, EST NON AFRICAN AMERICAN: 89 mL/min (ref >=60)
GFR, Est African American: >89 mL/min (ref >=60)
Glucose, Bld: 103 mg/dL — ABNORMAL HIGH (ref 65–99)
Potassium: 4.4 mmol/L (ref 3.5–5.3)
SODIUM: 140 mmol/L (ref 135–146)

## 2015-11-17 LAB — HEPATIC FUNCTION PANEL
ALK PHOS: 58 U/L (ref 40–115)
ALT: 28 U/L (ref 9–46)
AST: 39 U/L — AB (ref 10–35)
Albumin: 4.8 g/dL (ref 3.6–5.1)
BILIRUBIN DIRECT: 0.3 mg/dL — AB (ref <=0.2)
BILIRUBIN INDIRECT: 1.1 mg/dL (ref 0.2–1.2)
Total Bilirubin: 1.4 mg/dL — ABNORMAL HIGH (ref 0.2–1.2)
Total Protein: 6.9 g/dL (ref 6.1–8.1)

## 2015-11-17 LAB — URIC ACID: URIC ACID, SERUM: 6.2 mg/dL (ref 4.0–8.0)

## 2015-11-17 LAB — MAGNESIUM: MAGNESIUM: 2 mg/dL (ref 1.5–2.5)

## 2015-11-17 LAB — IRON AND TIBC
%SAT: 55 % (ref 15–60)
IRON: 183 ug/dL — AB (ref 50–180)
TIBC: 334 ug/dL (ref 250–425)
UIBC: 151 ug/dL (ref 125–400)

## 2015-11-17 LAB — TSH: TSH: 1.91 mIU/L (ref 0.40–4.50)

## 2015-11-17 LAB — VITAMIN B12: Vitamin B-12: 669 pg/mL (ref 200–1100)

## 2015-11-17 LAB — PSA: PSA: 2.6 ng/mL (ref <=4.0)

## 2015-11-17 LAB — FERRITIN: Ferritin: 66 ng/mL (ref 20–380)

## 2015-11-17 NOTE — Patient Instructions (Addendum)
Cut losartan in half Doing great with weight loss, keep it up!  We want weight loss that will last so you should lose 1-2 pounds a week.  THAT IS IT! Please pick THREE things a month to change. Once it is a habit check off the item. Then pick another three items off the list to become habits.  If you are already doing a habit on the list GREAT!  Cross that item off! o Don't drink your calories. Ie, alcohol, soda, fruit juice, and sweet tea.  o Drink more water. Drink a glass when you feel hungry or before each meal.  o Eat breakfast - Complex carb and protein (likeDannon light and fit yogurt, oatmeal, fruit, eggs, [place] bacon). o Measure your cereal.  Eat no more than one cup a day. (ie [place]) o Eat an apple a day. o Add a vegetable a day. o Try a new vegetable a month. o Use Pam! Stop using oil or butter to cook. o Don't finish your plate or use smaller plates. o Share your dessert. o Eat sugar free Jello for dessert or frozen grapes. o Don't eat 2-3 hours before bed. o Switch to whole wheat bread, pasta, and brown rice. o Make healthier choices when you eat out. No fries! o Pick baked chicken, NOT fried. o Don't forget to SLOW DOWN when you eat. It is not going anywhere.  o Take the stairs. o Park far away in the parking lot o [employer] (or weights) for 10 minutes while watching TV. o Walk at work for 10 minutes during break. o Walk outside 1 time a week with your friend, kids, dog, or significant other. o Start a walking group at [place] the mall as much as you can tolerate.  o Keep a food diary. o Weigh yourself daily. o Walk for 15 minutes 3 days per week. o Cook at home more often and eat out less.  If life happens and you go back to old habits, it is okay.  Just start over. You can do it!   If you experience chest pain, get short of breath, or tired during the exercise, please stop immediately and inform your doctor.

## 2015-11-17 NOTE — Progress Notes (Signed)
Complete Physical  Assessment and Plan: Essential hypertension - continue medications, DASH diet, exercise and monitor at home. Call if greater than 130/80.  - CBC with Differential/Platelet - BASIC METABOLIC PANEL WITH GFR - TSH - Urinalysis, Routine w reflex microscopic (not at [facility]) - Microalbumin / creatinine urine ratio - EKG 12-Lead  Fatty liver Check labs, avoid tylenol, alcohol, continue weight loss  - Hepatic function panel  Obesity Obesity with co morbidities- long discussion about weight loss, diet, and exercise - Lipid panel  Vitamin B12 deficiency Continue supplement  Gastroesophageal reflux disease without esophagitis Continue PPI/H2 blocker, diet discussed  Anemia, unspecified anemia type - monitor, continue iron supp with Vitamin C and increase green leafy veggies  Other seasonal allergic rhinitis Continue OTC allergy pills  DDD (degenerative disc disease), lumbar RICE, NSAIDS, exercises given, if not better get xray and PT referral or ortho referral.  - Uric acid  Elevated prostate specific antigen (PSA) - PSA   Encounter for general adult medical examination with abnormal findings - CBC with Differential/Platelet - BASIC METABOLIC PANEL WITH GFR - Hepatic function panel - TSH - Lipid panel - Hemoglobin A1c - Insulin, fasting - Magnesium - Vit D  25 hydroxy (rtn osteoporosis monitoring) - Urinalysis, Routine w reflex microscopic (not at [facility]) - Microalbumin / creatinine urine ratio - PSA - Uric acid - EKG 12-Lead - CT Chest Wo Contrast; Future   Prediabetes Discussed general issues about diabetes pathophysiology and management., Educational material distributed., Suggested low cholesterol diet., Encouraged aerobic exercise., Discussed foot care., Reminded to get yearly retinal exam. - Hemoglobin A1c - Insulin, fasting  Medication management - Magnesium  Vitamin D deficiency - Vit D  25 hydroxy (rtn osteoporosis monitoring)   Lung  nodule < 6cm on CT Had lung nodulewill get repeat CT scan, if unchanged last one.  - CT Chest Wo Contrast; Future   Discussed med's effects and SE's. Screening labs and tests as requested with regular follow-up as recommended.  HPI  His blood pressure has been controlled at home but he did have an exaggerated BP response while on treadmill, doing well, today their BP is BP: 122/68 He does not workout, but he is very active. He denies dizziness, SOB, CP.  He had recent normal cardiac work up.  He is not on cholesterol medication and denies myalgias. His cholesterol is at goal. The cholesterol last visit was:   Lab Results  Component Value Date   CHOL 169 11/11/2014   HDL 50 11/11/2014   LDLCALC 102 11/11/2014   TRIG 83 11/11/2014   CHOLHDL 3.4 11/11/2014    He has been working on diet and exercise for prediabetes, and denies paresthesia of the feet, polydipsia, polyuria and visual disturbances. Last A1C in the office was:  Lab Results  Component Value Date   HGBA1C 6.0 (H) 11/11/2014   Last PSA was 4.46 and got better with ABX became 2.14, he is not complaining of hesitancy and dribbling, nocturia x 1. Lab Results  Component Value Date   PSA 2.13 11/11/2014   PSA 2.48 11/10/2013   Patient is on Vitamin D supplement.   Lab Results  Component Value Date   VD25OH 39 11/11/2014   He has lower back pain, has left side worse than right. Has some left anterior hip pain with walking.  BMI is Body mass index is 32.73 kg/m., he states that he can not get motivated. He does drink heavily during the summer but states he has not drank as  much before.  Wt Readings from Last 3 Encounters:  11/17/15 220 lb (99.8 kg)  04/27/15 230 lb (104.3 kg)  11/11/14 256 lb (116.1 kg)    Current Medications:    Medication List       Accurate as of 11/17/15  9:44 AM. Always use your most recent med list.          DEXILANT 60 MG capsule Generic drug:  dexlansoprazole TAKE 1 CAPSULE BY MOUTH  EVERY DAY   fexofenadine 180 MG tablet Commonly known as:  ALLEGRA Take 180 mg by mouth daily.   losartan 100 MG tablet Commonly known as:  COZAAR TAKE 1 TABLET BY MOUTH EVERY DAY   ranitidine 300 MG tablet Commonly known as:  ZANTAC Take 1 tablet (300 mg total) by mouth at bedtime.      Health Maintenance:  Immunization History  Administered Date(s) Administered  . Pneumococcal-Unspecified 02/19/1995  . Tdap 10/07/2012   Tetanus: 2014 Pneumovax: 1997 Prevnar 13: due age 71 Flu vaccine: declines Zostavax: N/A  DEXA: Colonoscopy:06/2011: Stress test 10/2013, normal CT cardiac score 10/2013 normal Echo 2013 CT chest 11/11/2014  Medical History:  Past Medical History:  Diagnosis Date  . Allergic rhinitis   . Anemia   . Chest pain   . GERD (gastroesophageal reflux disease)   . GERD (gastroesophageal reflux disease) 08/01/2013  . Hypertension   . Obesity   . Tendonitis   . Vitamin B12 deficiency    Surgical History:  Review of Systems  Constitutional: Negative.  Negative for fever.  HENT: Negative.   Respiratory: Negative for cough, hemoptysis, sputum production, shortness of breath and wheezing.   Cardiovascular: Negative.  Negative for chest pain.  Gastrointestinal: Negative.  Negative for abdominal pain.  Genitourinary: Negative for dysuria, flank pain, frequency, hematuria and urgency.  Musculoskeletal: Positive for joint pain. Negative for back pain, falls, myalgias and neck pain.  Skin: Negative.   Neurological: Negative.  Negative for tingling, weakness and headaches.  Psychiatric/Behavioral: Negative for depression, hallucinations, memory loss, substance abuse and suicidal ideas. The patient is not nervous/anxious and does not have insomnia.    Allergies Allergies  Allergen Reactions  . Iodides Hives   SURGICAL HISTORY He  has a past surgical history that includes Back surgery and Bunionectomy (Left, 02/14/2014). FAMILY HISTORY His family history  includes Breast cancer in his mother; Esophageal cancer in his father; Heart disease in his maternal uncle and sister; Lung cancer in his mother. SOCIAL HISTORY He  reports that he has never smoked. He quit smokeless tobacco use about 15 years ago. His smokeless tobacco use included Chew. He reports that he drinks alcohol. He reports that he does not use drugs.  Physical Exam: Estimated body mass index is 32.73 kg/m as calculated from the following:   Height as of this encounter: 5' 8.75" (1.746 m).   Weight as of this encounter: 220 lb (99.8 kg). BP 122/68   Pulse (!) 54   Temp 98.2 F (36.8 C) (Temporal)   Resp 16   Ht 5' 8.75" (1.746 m)   Wt 220 lb (99.8 kg)   BMI 32.73 kg/m  General Appearance: Well nourished, in no apparent distress. Eyes: PERRLA, EOMs, conjunctiva no swelling or erythema, normal fundi and vessels. Sinuses: No Frontal/maxillary tenderness ENT/Mouth: Ext aud canals clear, normal light reflex with TMs without erythema, bulging. Good dentition. No erythema, swelling, or exudate on post pharynx. Tonsils not swollen or erythematous. Hearing normal.  Neck: Supple, thyroid normal. No bruits Respiratory: Respiratory  effort normal, BS equal bilaterally without rales, rhonchi, wheezing or stridor. Cardio: RRR without murmurs, rubs or gallops. Brisk peripheral pulses without edema.  Chest: symmetric, with normal excursions and percussion. Abdomen: Soft, +BS. Non tender, no guarding, rebound, hernias, masses, or organomegaly.  Lymphatics: Non tender without lymphadenopathy.  Genitourinary: defer Musculoskeletal: Full ROM all peripheral extremities,5/5 strength, and normal gait. Skin: Warm, dry without rashes, lesions, ecchymosis.  Neuro: Cranial nerves intact, reflexes equal bilaterally. Normal muscle tone, no cerebellar symptoms. Sensation intact.  Psych: Awake and oriented X 3, normal affect, Insight and Judgment appropriate.   EKG: WNL, no ST changes AORTA SCAN: defer    [name] 9:44 AM

## 2015-11-18 LAB — URINALYSIS, ROUTINE W REFLEX MICROSCOPIC
Bilirubin Urine: NEGATIVE
Glucose, UA: NEGATIVE
Hgb urine dipstick: NEGATIVE
KETONES UR: NEGATIVE
LEUKOCYTES UA: NEGATIVE
NITRITE: NEGATIVE
PH: 7.5 (ref 5.0–8.0)
Protein, ur: NEGATIVE
SPECIFIC GRAVITY, URINE: 1.011 (ref 1.001–1.035)

## 2015-11-18 LAB — VITAMIN D 25 HYDROXY (VIT D DEFICIENCY, FRACTURES): Vit D, 25-Hydroxy: 41 ng/mL (ref 30–100)

## 2015-11-18 LAB — MICROALBUMIN / CREATININE URINE RATIO: Creatinine, Urine: 69 mg/dL (ref 20–370)

## 2015-11-18 LAB — HEMOGLOBIN A1C
HEMOGLOBIN A1C: 5.3 % (ref <5.7)
MEAN PLASMA GLUCOSE: 105 mg/dL

## 2015-11-20 ENCOUNTER — Other Ambulatory Visit: Payer: 59

## 2015-11-20 DIAGNOSIS — R5383 Other fatigue: Secondary | ICD-10-CM

## 2015-11-22 LAB — EHRLICHIA ANTIBODY PANEL
E chaffeensis (HGE) Ab, IgG: <1:64 {titer}
E chaffeensis (HGE) Ab, IgM: <1:20 {titer}

## 2015-11-24 LAB — LYME ABY, WSTRN BLT IGG & IGM W/BANDS
B BURGDORFERI IGG ABS (IB): NEGATIVE
B BURGDORFERI IGM ABS (IB): NEGATIVE
LYME DISEASE 18 KD IGG: NONREACTIVE
LYME DISEASE 23 KD IGM: NONREACTIVE
LYME DISEASE 30 KD IGG: NONREACTIVE
LYME DISEASE 39 KD IGG: NONREACTIVE
LYME DISEASE 41 KD IGG: NONREACTIVE
LYME DISEASE 45 KD IGG: NONREACTIVE
LYME DISEASE 58 KD IGG: NONREACTIVE
LYME DISEASE 66 KD IGG: NONREACTIVE
LYME DISEASE 93 KD IGG: NONREACTIVE
Lyme Disease 23 kD IgG: NONREACTIVE
Lyme Disease 28 kD IgG: NONREACTIVE
Lyme Disease 39 kD IgM: NONREACTIVE
Lyme Disease 41 kD IgM: NONREACTIVE

## 2015-11-24 LAB — ROCKY MTN SPOTTED FVR ABS PNL(IGG+IGM)
RMSF IGM: NOT DETECTED
RMSF IgG: NOT DETECTED

## 2015-11-28 ENCOUNTER — Ambulatory Visit
Admission: RE | Admit: 2015-11-28 | Discharge: 2015-11-28 | Disposition: A | Discharge status: Home / Self Care | Payer: 59 | Source: Ambulatory Visit | Attending: Physician Assistant | Admitting: Physician Assistant

## 2015-11-28 DIAGNOSIS — R911 Solitary pulmonary nodule: Secondary | ICD-10-CM

## 2016-01-12 ENCOUNTER — Other Ambulatory Visit: Payer: Self-pay | Admitting: Internal Medicine

## 2016-01-15 ENCOUNTER — Encounter: Payer: Self-pay | Admitting: Physician Assistant

## 2016-01-16 ENCOUNTER — Other Ambulatory Visit: Payer: Self-pay | Admitting: Physician Assistant

## 2016-01-16 MED ORDER — LOSARTAN POTASSIUM 50 MG PO TABS
50.0000 mg | ORAL_TABLET | Freq: Every day | ORAL | 1 refills | Status: DC
Start: 2016-01-16 — End: 2016-12-30

## 2016-04-19 ENCOUNTER — Other Ambulatory Visit: Payer: Self-pay | Admitting: Internal Medicine

## 2016-05-23 ENCOUNTER — Other Ambulatory Visit: Payer: Self-pay | Admitting: Physician Assistant

## 2016-05-23 MED ORDER — PANTOPRAZOLE SODIUM 40 MG PO TBEC: 40.0000 mg | DELAYED_RELEASE_TABLET | Freq: Every day | ORAL | 1 refills | Status: DC

## 2016-05-24 ENCOUNTER — Ambulatory Visit (INDEPENDENT_AMBULATORY_CARE_PROVIDER_SITE_OTHER): Payer: BLUE CROSS/BLUE SHIELD | Admitting: Physician Assistant

## 2016-05-24 VITALS — BP 126/82 | HR 70 | Temp 98.0°F | Resp 16 | Ht 69.0 in | Wt 216.0 lb

## 2016-05-24 DIAGNOSIS — M2550 Pain in unspecified joint: Secondary | ICD-10-CM | POA: Diagnosis not present

## 2016-05-24 DIAGNOSIS — D649 Anemia, unspecified: Secondary | ICD-10-CM | POA: Diagnosis not present

## 2016-05-24 DIAGNOSIS — Z79899 Other long term (current) drug therapy: Secondary | ICD-10-CM | POA: Diagnosis not present

## 2016-05-24 DIAGNOSIS — K760 Fatty (change of) liver, not elsewhere classified: Secondary | ICD-10-CM | POA: Diagnosis not present

## 2016-05-24 DIAGNOSIS — I10 Essential (primary) hypertension: Secondary | ICD-10-CM | POA: Diagnosis not present

## 2016-05-24 LAB — BASIC METABOLIC PANEL WITH GFR
BUN: 18 mg/dL (ref 7–25)
CHLORIDE: 105 mmol/L (ref 98–110)
CO2: 26 mmol/L (ref 20–31)
Calcium: 9.1 mg/dL (ref 8.6–10.3)
Creat: 0.94 mg/dL (ref 0.70–1.33)
GFR, Est African American: >89 mL/min (ref >=60)
GLUCOSE: 99 mg/dL (ref 65–99)
Potassium: 4.4 mmol/L (ref 3.5–5.3)
Sodium: 140 mmol/L (ref 135–146)

## 2016-05-24 LAB — CBC WITH DIFFERENTIAL/PLATELET
BASOS ABS: 55 {cells}/uL (ref 0–200)
BASOS PCT: 1 %
EOS ABS: 220 {cells}/uL (ref 15–500)
Eosinophils Relative: 4 %
HEMATOCRIT: 45.3 % (ref 38.5–50.0)
Hemoglobin: 15.8 g/dL (ref 13.2–17.1)
LYMPHS PCT: 26 %
Lymphs Abs: 1430 cells/uL (ref 850–3900)
MCH: 30.2 pg (ref 27.0–33.0)
MCHC: 34.9 g/dL (ref 32.0–36.0)
MCV: 86.6 fL (ref 80.0–100.0)
MONO ABS: 440 {cells}/uL (ref 200–950)
MONOS PCT: 8 %
MPV: 10.4 fL (ref 7.5–12.5)
NEUTROS PCT: 61 %
Neutro Abs: 3355 cells/uL (ref 1500–7800)
Platelets: 216 10*3/uL (ref 140–400)
RBC: 5.23 MIL/uL (ref 4.20–5.80)
RDW: 13.6 % (ref 11.0–15.0)
WBC: 5.5 10*3/uL (ref 3.8–10.8)

## 2016-05-24 LAB — HEPATIC FUNCTION PANEL
ALK PHOS: 70 U/L (ref 40–115)
ALT: 22 U/L (ref 9–46)
AST: 22 U/L (ref 10–35)
Albumin: 4.4 g/dL (ref 3.6–5.1)
BILIRUBIN DIRECT: 0.2 mg/dL (ref <=0.2)
BILIRUBIN INDIRECT: 0.7 mg/dL (ref 0.2–1.2)
Total Bilirubin: 0.9 mg/dL (ref 0.2–1.2)
Total Protein: 6.6 g/dL (ref 6.1–8.1)

## 2016-05-24 LAB — CK: Total CK: 181 U/L (ref 7–232)

## 2016-05-24 LAB — TSH: TSH: 2.19 mIU/L (ref 0.40–4.50)

## 2016-05-24 LAB — MAGNESIUM: Magnesium: 2 mg/dL (ref 1.5–2.5)

## 2016-05-24 LAB — URIC ACID: URIC ACID, SERUM: 6.7 mg/dL (ref 4.0–8.0)

## 2016-05-24 NOTE — Progress Notes (Signed)
6 month follow up  Assessment and Plan: Essential hypertension - continue medications, DASH diet, exercise and monitor at home. Call if greater than 130/80.  - CBC with Differential/Platelet - BASIC METABOLIC PANEL WITH GFR - TSH  Fatty liver Check labs, avoid tylenol, alcohol, continue weight loss  - Hepatic function panel   Overweight  - long discussion about weight loss, diet, and exercise  Medication management - Magnesium  Arthralgia, unspecified joint -     Magnesium -     CK -     Sedimentation rate -     C-reactive protein -     Uric acid  Discussed med's effects and SE's. Screening labs and tests as requested with regular follow-up as recommended.  HPI  His blood pressure has been controlled at home but he did have an exaggerated BP response while on treadmill, doing well, today their BP is BP: 126/82 He does not workout, but he is very active. He denies dizziness, SOB, CP.  He had recent normal cardiac work up.  Has been having bilateral ankle pain, hand pain, wrist pain, shoulder pain, and hip pain, tumeric is helping some, does have family history of RA.  He is not on cholesterol medication and denies myalgias. His cholesterol is at goal. The cholesterol last visit was:   Lab Results  Component Value Date   CHOL 170 11/17/2015   HDL 53 11/17/2015   LDLCALC 102 11/17/2015   TRIG 77 11/17/2015   CHOLHDL 3.2 11/17/2015    He has been working on diet and exercise for prediabetes, and denies paresthesia of the feet, polydipsia, polyuria and visual disturbances. Last A1C in the office was:  Lab Results  Component Value Date   HGBA1C 5.3 11/17/2015   Patient is on Vitamin D supplement.   Lab Results  Component Value Date   VD25OH 41 11/17/2015   BMI is Body mass index is 31.9 kg/m., he is doing well with weight loss.  Wt Readings from Last 3 Encounters:  05/24/16 216 lb (98 kg)  11/17/15 220 lb (99.8 kg)  04/27/15 230 lb (104.3 kg)    Current  Medications:  Current Outpatient Prescriptions on File Prior to Visit  Medication Sig  . DEXILANT 60 MG capsule TAKE 1 CAPSULE BY MOUTH EVERY DAY  . fexofenadine (ALLEGRA) 180 MG tablet Take 180 mg by mouth daily.  . hydrocortisone-pramoxine (ANALPRAM-HC) 2.5-1 % rectal cream U REC TID  . losartan (COZAAR) 50 MG tablet Take 1 tablet (50 mg total) by mouth daily.  . ranitidine (ZANTAC) 300 MG tablet Take 1 tablet (300 mg total) by mouth at bedtime.   No current facility-administered medications on file prior to visit.     Medical History:  Past Medical History:  Diagnosis Date  . Allergic rhinitis   . Anemia   . Chest pain   . GERD (gastroesophageal reflux disease)   . GERD (gastroesophageal reflux disease) 08/01/2013  . Hypertension   . Obesity   . Tendonitis   . Vitamin B12 deficiency    Surgical History:  Review of Systems  Constitutional: Negative.  Negative for fever.  HENT: Negative.   Respiratory: Negative for cough, hemoptysis, sputum production, shortness of breath and wheezing.   Cardiovascular: Negative.  Negative for chest pain.  Gastrointestinal: Negative.  Negative for abdominal pain.  Genitourinary: Negative for dysuria, flank pain, frequency, hematuria and urgency.  Musculoskeletal: Positive for joint pain. Negative for back pain, falls, myalgias and neck pain.  Skin: Negative.  Neurological: Negative.  Negative for tingling, weakness and headaches.  Psychiatric/Behavioral: Negative for depression, hallucinations, memory loss, substance abuse and suicidal ideas. The patient is not nervous/anxious and does not have insomnia.    Allergies Allergies  Allergen Reactions  . Iodides Hives   Physical Exam: Estimated body mass index is 31.9 kg/m as calculated from the following:   Height as of this encounter: 5\' 9"  (1.753 m).   Weight as of this encounter: 216 lb (98 kg). BP 126/82   Pulse 70   Temp 98 F (36.7 C) (Temporal)   Resp 16   Ht 5\' 9"  (1.753 m)    Wt 216 lb (98 kg) Comment: Pt reported  BMI 31.90 kg/m  General Appearance: Well nourished, in no apparent distress. Eyes: PERRLA, EOMs, conjunctiva no swelling or erythema, normal fundi and vessels. Sinuses: No Frontal/maxillary tenderness ENT/Mouth: Ext aud canals clear, normal light reflex with TMs without erythema, bulging. Good dentition. No erythema, swelling, or exudate on post pharynx. Tonsils not swollen or erythematous. Hearing normal.  Neck: Supple, thyroid normal. No bruits Respiratory: Respiratory effort normal, BS equal bilaterally without rales, rhonchi, wheezing or stridor. Cardio: RRR without murmurs, rubs or gallops. Brisk peripheral pulses without edema.  Chest: symmetric, with normal excursions and percussion. Abdomen: Soft, +BS. Non tender, no guarding, rebound, hernias, masses, or organomegaly.  Lymphatics: Non tender without lymphadenopathy.  Musculoskeletal: Full ROM all peripheral extremities,5/5 strength, and normal gait. Skin: Warm, dry without rashes, lesions, ecchymosis.  Neuro: Cranial nerves intact, reflexes equal bilaterally. Normal muscle tone, no cerebellar symptoms. Sensation intact.  Psych: Awake and oriented X 3, normal affect, Insight and Judgment appropriate.    [name] 8:41 AM

## 2016-05-25 LAB — SEDIMENTATION RATE: Sed Rate: 1 mm/hr (ref 0–20)

## 2016-05-27 LAB — C-REACTIVE PROTEIN: CRP: 3.8 mg/L (ref <8.0)

## 2016-05-29 ENCOUNTER — Encounter: Payer: Self-pay | Admitting: Physician Assistant

## 2016-05-30 ENCOUNTER — Encounter: Payer: Self-pay | Admitting: Physician Assistant

## 2016-08-02 ENCOUNTER — Other Ambulatory Visit: Payer: Self-pay | Admitting: Internal Medicine

## 2016-08-06 ENCOUNTER — Other Ambulatory Visit: Payer: Self-pay | Admitting: Physician Assistant

## 2016-08-06 MED ORDER — PANTOPRAZOLE SODIUM 40 MG PO TBEC: 40.0000 mg | DELAYED_RELEASE_TABLET | Freq: Every day | ORAL | 1 refills | Status: DC

## 2016-09-05 ENCOUNTER — Encounter: Payer: Self-pay | Admitting: Physician Assistant

## 2016-09-06 MED ORDER — LANSOPRAZOLE 30 MG PO CPDR: 30.0000 mg | DELAYED_RELEASE_CAPSULE | Freq: Every day | ORAL | 1 refills | Status: DC

## 2016-10-07 DIAGNOSIS — Z86018 Personal history of other benign neoplasm: Secondary | ICD-10-CM | POA: Diagnosis not present

## 2016-10-07 DIAGNOSIS — D485 Neoplasm of uncertain behavior of skin: Secondary | ICD-10-CM | POA: Diagnosis not present

## 2016-10-07 DIAGNOSIS — L821 Other seborrheic keratosis: Secondary | ICD-10-CM | POA: Diagnosis not present

## 2016-10-07 DIAGNOSIS — L814 Other melanin hyperpigmentation: Secondary | ICD-10-CM | POA: Diagnosis not present

## 2016-10-07 DIAGNOSIS — D1801 Hemangioma of skin and subcutaneous tissue: Secondary | ICD-10-CM | POA: Diagnosis not present

## 2016-10-30 ENCOUNTER — Other Ambulatory Visit: Payer: Self-pay | Admitting: Physician Assistant

## 2016-11-20 ENCOUNTER — Encounter: Payer: Self-pay | Admitting: Adult Health

## 2016-11-20 NOTE — Progress Notes (Signed)
Complete Physical  Assessment and Plan:  Albert Griffin was seen today for annual exam.  Diagnoses and all orders for this visit:  Encounter for general adult medical examination with abnormal findings  Essential hypertension -     Continue medication: losartan 50 mg daily -     Check BP at home, call if over 130/80 -     Magnesium  Allergic rhinitis, unspecified seasonality, unspecified trigger       -      Avoid triggers, OTC allergy medication PRN  Gastroesophageal reflux disease without esophagitis -     Continue PPI/H2 inhibitor -     Avoid triggers, advised to lose weight -      Magnesium  Fatty liver -     Discussed reducing alcohol intake, avoid tylenol use -      Hepatic function panel  DDD (degenerative disc disease), lumbar      -      Stable, declines PT/ortho referral at this time  Vitamin B12 deficiency -     Vitamin B12  Anemia, unspecified type -     CBC with Differential/Platelet  Class 1 obesity due to excess calories without serious comorbidity with body mass index (BMI) of 32.0 to 32.9 in adult -     TSH -     Hemoglobin A1c -     Insulin, fasting -     Discussed diet and exercise, need for weight loss. Pt aware, states is an issue of self-motivation.   Prediabetes -    Currently at goal with lifestyle changes -     TSH -     Hemoglobin A1c -     Insulin, fasting  Lung nodule, solitary      -      Stable on follow up CT 2017- no recommended follow up- filing to history.   Screening for cardiovascular condition -     BASIC METABOLIC PANEL WITH GFR -     EKG 12-Lead -     [place], RETROPERITNL ABD,  LTD  Screening for hematuria or proteinuria -     Lipid panel -     Microalbumin / creatinine urine ratio  Screening for colon cancer -     POC Hemoccult Bld/Stl (3-Cd Home Screen); Future  Vitamin D deficiency/ osteoporosis prevention -     VITAMIN D 25 Hydroxy (Vit-D Deficiency, Fractures) -     Discussed supplementation today  Need for hepatitis C  screening test- 1 time for age recommendations -     Hepatitis C antibody  Screening for HIV (human immunodeficiency virus)- 1 time due to age recommendations -     HIV antibody  Prostate cancer screening -     PSA    Discussed med's effects and SE's. Screening labs and tests as requested with regular follow-up as recommended. Over 40 minutes of exam, counseling, chart review and critical decision making was performed  Future Appointments Date Time Provider [place]  05/22/2017 4:30 PM Liane Comber, NP [facility] None  11/24/2017 3:00 PM Liane Comber, NP [facility] None     HPI Patient presents for a complete physical. He reports an intermittent superficial RLQ abdominal pain with straining, and intermittent ongoing tingling to L greater toe (onset after a foot procedure at podiatry). He denies wanting medication for this. Denies snoring, does not perform self testicular exams.   His blood pressure has been controlled at home, today their BP is BP: 124/84 He does not workout. He denies chest pain,  shortness of breath, dizziness.   He is not on cholesterol medication and denies myalgias. His cholesterol is at goal. The cholesterol last visit was:   Lab Results  Component Value Date   CHOL 170 11/17/2015   HDL 53 11/17/2015   LDLCALC 102 11/17/2015   TRIG 77 11/17/2015   CHOLHDL 3.2 11/17/2015   He has been working on diet for his prediabetes, he is not on bASA, he is on ACE/ARB and denies nausea, polydipsia, polyuria, visual disturbances, vomiting and weight loss. Last A1C in the office was at goal:  Lab Results  Component Value Date   HGBA1C 5.3 11/17/2015   Last GFR: Lab Results  Component Value Date   GFRNONAA >89 05/24/2016   Patient is not on Vitamin D supplement:    Lab Results  Component Value Date   VD25OH 41 11/17/2015     Last PSA was: Lab Results  Component Value Date   PSA 2.6 11/17/2015    Full review of medical history performed and  updated as appropriate.   Current Medications:  Current Outpatient Prescriptions on File Prior to Visit  Medication Sig Dispense Refill  . fexofenadine (ALLEGRA) 180 MG tablet Take 180 mg by mouth daily.    . hydrocortisone-pramoxine (ANALPRAM-HC) 2.5-1 % rectal cream U REC TID  2  . lansoprazole (PREVACID) 30 MG capsule TAKE 1 CAPSULE(30 MG) BY MOUTH DAILY 90 capsule 0  . losartan (COZAAR) 50 MG tablet Take 1 tablet (50 mg total) by mouth daily. 90 tablet 1  . OVER THE COUNTER MEDICATION 1,500 mg 3 (three) times daily. Turmeric Curcumin    . ranitidine (ZANTAC) 300 MG tablet Take 1 tablet (300 mg total) by mouth at bedtime. 30 tablet 1   No current facility-administered medications on file prior to visit.    Allergies:  Allergies  Allergen Reactions  . Iodides Hives   Health Maintenance:  Immunization History  Administered Date(s) Administered  . Pneumococcal-Unspecified 02/19/1995  . Tdap 10/07/2012    Tetanus: 2014 Pneumovax: 1997 Prevnar 13: due age 72 Flu vaccine: declines Zostavax: declines  DEXA:  Colonoscopy:06/2011 Stress test 10/2013, normal CT cardiac score 10/2013 normal Echo 2013 CT chest: 11/11/2014 nodule noted; follow up CT done 11/28/2015- nodule stable, no follow up recommended.   Eye: 01/2016 Dental: 11/10/2016   Patient Care Team: Unk Pinto, MD as PCP - General (Internal Medicine) Sable Feil, MD as Consulting Physician (Gastroenterology) Marica Otter, [place] (Optometry) Josue Hector, MD as Consulting Physician (Cardiology) Jovita Gamma, MD as Consulting Physician (Neurosurgery) Jari Pigg, MD as Consulting Physician (Dermatology)  Medical History:  has HTN (hypertension); GERD (gastroesophageal reflux disease); Allergic rhinitis; Vitamin B12 deficiency; Anemia; Fatty liver; Obesity; DDD (degenerative disc disease), lumbar; and Prediabetes on his problem list. Surgical History:  He  has a past surgical history that includes  Back surgery; Bunionectomy (Left, 02/14/2014); and Cervical disc surgery (2003). Family History:  His family history includes Brain cancer in his paternal uncle; Breast cancer in his mother; Esophageal cancer in his father; Heart disease in his maternal uncle and sister; Lung cancer in his mother; Melanoma in his sister; Ovarian cancer in his sister. Social History:   reports that he has never smoked. He quit smokeless tobacco use about 16 years ago. His smokeless tobacco use included Chew. He reports that he drinks alcohol. He reports that he does not use drugs. Review of Systems:  Review of Systems  Constitutional: Negative for chills, fever and malaise/fatigue.  HENT: Negative for congestion,  hearing loss (Left ear with reduced hearing consistent with baseline) and tinnitus.   Eyes: Negative for blurred vision and double vision.  Respiratory: Negative for cough, hemoptysis, sputum production, shortness of breath and wheezing.   Cardiovascular: Negative for chest pain, palpitations, orthopnea, claudication and leg swelling.  Gastrointestinal: Positive for abdominal pain (Intermittent RLQ pain with bearing down) and heartburn. Negative for blood in stool, constipation, diarrhea, melena, nausea and vomiting.  Genitourinary: Negative for flank pain, frequency, hematuria and urgency.  Musculoskeletal: Positive for back pain (Chronic stable low back pain ). Negative for myalgias.  Skin: Negative for rash.  Neurological: Positive for tingling (Intermittent, left great toe, started after foot procedure. Declines intervention at this time. ). Negative for dizziness, sensory change, speech change, focal weakness, weakness and headaches.  Endo/Heme/Allergies: Positive for environmental allergies. Negative for polydipsia. Does not bruise/bleed easily.  Psychiatric/Behavioral: Negative for depression, memory loss, substance abuse and suicidal ideas. The patient does not have insomnia.     Physical  Exam: Estimated body mass index is 33.13 kg/m as calculated from the following:   Height as of this encounter: 5' 9.5" (1.765 m).   Weight as of this encounter: 227 lb 9.6 oz (103.2 kg). BP 124/84   Pulse 68   Temp (!) 97.5 F (36.4 C)   Resp 18   Ht 5' 9.5" (1.765 m)   Wt 227 lb 9.6 oz (103.2 kg)   BMI 33.13 kg/m  General Appearance: Well nourished, in no apparent distress.  Eyes: PERRLA, EOMs, conjunctiva no swelling or erythema.  Sinuses: No Frontal/maxillary tenderness  ENT/Mouth: Ext aud canals clear, normal light reflex with TMs without erythema, bulging. Good dentition. No erythema, swelling, or exudate on post pharynx. Tonsils not swollen or erythematous. Hearing decreased on left (reportedly consistent with baseline).  Neck: Supple, thyroid normal. No bruits  Respiratory: Respiratory effort normal, BS equal bilaterally without rales, rhonchi, wheezing or stridor.  Cardio: RRR without murmurs, rubs or gallops. Brisk peripheral pulses without edema.  Chest: symmetric, with normal excursions and percussion.  Abdomen: Soft, nontender, no guarding, rebound, masses, or organomegaly. No palpable hernias on exam.  Lymphatics: Non tender without lymphadenopathy.  Genitourinary: Penis, testes normal without. DRE- discussed recommendations, patient requests to defer.  Musculoskeletal: Full ROM all peripheral extremities,5/5 strength, and normal gait.  Skin: Warm, dry without rashes, lesions, ecchymosis. Neuro: Cranial nerves intact, reflexes equal bilaterally. Normal muscle tone, no cerebellar symptoms. Sensation intact.  Psych: Awake and oriented X 3, normal affect, Insight and Judgment appropriate.  PHQ-2: 1   EKG: WNL no changes. AORTA SCAN: WNL   Albert Griffin 10:17 AM [facility]

## 2016-11-21 ENCOUNTER — Ambulatory Visit (INDEPENDENT_AMBULATORY_CARE_PROVIDER_SITE_OTHER): Payer: BLUE CROSS/BLUE SHIELD | Admitting: Adult Health

## 2016-11-21 ENCOUNTER — Encounter: Payer: Self-pay | Admitting: Adult Health

## 2016-11-21 VITALS — BP 124/84 | HR 68 | Temp 97.5°F | Resp 18 | Ht 69.5 in | Wt 227.6 lb

## 2016-11-21 DIAGNOSIS — Z136 Encounter for screening for cardiovascular disorders: Secondary | ICD-10-CM

## 2016-11-21 DIAGNOSIS — Z1389 Encounter for screening for other disorder: Secondary | ICD-10-CM

## 2016-11-21 DIAGNOSIS — Z125 Encounter for screening for malignant neoplasm of prostate: Secondary | ICD-10-CM | POA: Diagnosis not present

## 2016-11-21 DIAGNOSIS — K219 Gastro-esophageal reflux disease without esophagitis: Secondary | ICD-10-CM

## 2016-11-21 DIAGNOSIS — Z0000 Encounter for general adult medical examination without abnormal findings: Secondary | ICD-10-CM | POA: Diagnosis not present

## 2016-11-21 DIAGNOSIS — E6609 Other obesity due to excess calories: Secondary | ICD-10-CM

## 2016-11-21 DIAGNOSIS — I10 Essential (primary) hypertension: Secondary | ICD-10-CM | POA: Diagnosis not present

## 2016-11-21 DIAGNOSIS — E559 Vitamin D deficiency, unspecified: Secondary | ICD-10-CM

## 2016-11-21 DIAGNOSIS — M5136 Other intervertebral disc degeneration, lumbar region: Secondary | ICD-10-CM

## 2016-11-21 DIAGNOSIS — J309 Allergic rhinitis, unspecified: Secondary | ICD-10-CM

## 2016-11-21 DIAGNOSIS — R5383 Other fatigue: Secondary | ICD-10-CM

## 2016-11-21 DIAGNOSIS — Z0001 Encounter for general adult medical examination with abnormal findings: Secondary | ICD-10-CM

## 2016-11-21 DIAGNOSIS — E538 Deficiency of other specified B group vitamins: Secondary | ICD-10-CM

## 2016-11-21 DIAGNOSIS — Z6832 Body mass index (BMI) 32.0-32.9, adult: Secondary | ICD-10-CM

## 2016-11-21 DIAGNOSIS — Z79899 Other long term (current) drug therapy: Secondary | ICD-10-CM | POA: Diagnosis not present

## 2016-11-21 DIAGNOSIS — R7309 Other abnormal glucose: Secondary | ICD-10-CM | POA: Diagnosis not present

## 2016-11-21 DIAGNOSIS — K760 Fatty (change of) liver, not elsewhere classified: Secondary | ICD-10-CM

## 2016-11-21 DIAGNOSIS — Z114 Encounter for screening for human immunodeficiency virus [HIV]: Secondary | ICD-10-CM

## 2016-11-21 DIAGNOSIS — Z1159 Encounter for screening for other viral diseases: Secondary | ICD-10-CM | POA: Diagnosis not present

## 2016-11-21 DIAGNOSIS — R7303 Prediabetes: Secondary | ICD-10-CM

## 2016-11-21 DIAGNOSIS — Z1211 Encounter for screening for malignant neoplasm of colon: Secondary | ICD-10-CM

## 2016-11-21 DIAGNOSIS — R911 Solitary pulmonary nodule: Secondary | ICD-10-CM

## 2016-11-21 DIAGNOSIS — D649 Anemia, unspecified: Secondary | ICD-10-CM

## 2016-11-21 NOTE — Patient Instructions (Signed)

## 2016-11-22 LAB — HEPATIC FUNCTION PANEL
AG Ratio: 2.2 (calc) (ref 1.0–2.5)
ALKALINE PHOSPHATASE (APISO): 59 U/L (ref 40–115)
ALT: 24 U/L (ref 9–46)
AST: 22 U/L (ref 10–35)
Albumin: 4.7 g/dL (ref 3.6–5.1)
Bilirubin, Direct: 0.2 mg/dL (ref 0.0–0.2)
Globulin: 2.1 g/dL (calc) (ref 1.9–3.7)
Indirect Bilirubin: 1 mg/dL (calc) (ref 0.2–1.2)
TOTAL PROTEIN: 6.8 g/dL (ref 6.1–8.1)
Total Bilirubin: 1.2 mg/dL (ref 0.2–1.2)

## 2016-11-22 LAB — BASIC METABOLIC PANEL WITH GFR
BUN: 14 mg/dL (ref 7–25)
CHLORIDE: 105 mmol/L (ref 98–110)
CO2: 27 mmol/L (ref 20–32)
Calcium: 9.4 mg/dL (ref 8.6–10.3)
Creat: 0.98 mg/dL (ref 0.70–1.33)
GFR, Est African American: 101 mL/min/{1.73_m2} (ref >=60)
GFR, Est Non African American: 87 mL/min/{1.73_m2} (ref >=60)
GLUCOSE: 109 mg/dL — AB (ref 65–99)
POTASSIUM: 4 mmol/L (ref 3.5–5.3)
SODIUM: 142 mmol/L (ref 135–146)

## 2016-11-22 LAB — CBC WITH DIFFERENTIAL/PLATELET
BASOS ABS: 39 {cells}/uL (ref 0–200)
Basophils Relative: 0.8 %
EOS PCT: 1.8 %
Eosinophils Absolute: 88 cells/uL (ref 15–500)
HCT: 44.4 % (ref 38.5–50.0)
Hemoglobin: 15.4 g/dL (ref 13.2–17.1)
Lymphs Abs: 1323 cells/uL (ref 850–3900)
MCH: 30.4 pg (ref 27.0–33.0)
MCHC: 34.7 g/dL (ref 32.0–36.0)
MCV: 87.6 fL (ref 80.0–100.0)
MONOS PCT: 8.4 %
MPV: 10.8 fL (ref 7.5–12.5)
NEUTROS ABS: 3038 {cells}/uL (ref 1500–7800)
Neutrophils Relative %: 62 %
Platelets: 233 10*3/uL (ref 140–400)
RBC: 5.07 10*6/uL (ref 4.20–5.80)
RDW: 12.4 % (ref 11.0–15.0)
Total Lymphocyte: 27 %
WBC mixed population: 412 cells/uL (ref 200–950)
WBC: 4.9 10*3/uL (ref 3.8–10.8)

## 2016-11-22 LAB — MAGNESIUM: Magnesium: 2.2 mg/dL (ref 1.5–2.5)

## 2016-11-22 LAB — LIPID PANEL
CHOLESTEROL: 173 mg/dL (ref <200)
HDL: 58 mg/dL (ref >40)
LDL Cholesterol (Calc): 100 mg/dL (calc) — ABNORMAL HIGH
Non-HDL Cholesterol (Calc): 115 mg/dL (calc) (ref <130)
Total CHOL/HDL Ratio: 3 (calc) (ref <5.0)
Triglycerides: 65 mg/dL (ref <150)

## 2016-11-22 LAB — HEPATITIS C ANTIBODY
Hepatitis C Ab: NONREACTIVE
SIGNAL TO CUT-OFF: 0.01 (ref <1.00)

## 2016-11-22 LAB — MICROALBUMIN / CREATININE URINE RATIO
CREATININE, URINE: 54 mg/dL (ref 20–320)
MICROALB UR: 0.2 mg/dL
Microalb Creat Ratio: 4 mcg/mg creat (ref <30)

## 2016-11-22 LAB — INSULIN, RANDOM: INSULIN: 7.8 u[IU]/mL (ref 2.0–19.6)

## 2016-11-22 LAB — HIV ANTIBODY (ROUTINE TESTING W REFLEX): HIV: NONREACTIVE

## 2016-11-22 LAB — PSA: PSA: 1.9 ng/mL (ref <=4.0)

## 2016-11-22 LAB — HEMOGLOBIN A1C
Hgb A1c MFr Bld: 5.3 % of total Hgb (ref <5.7)
Mean Plasma Glucose: 105 (calc)
eAG (mmol/L): 5.8 (calc)

## 2016-11-22 LAB — VITAMIN B12: Vitamin B-12: 525 pg/mL (ref 200–1100)

## 2016-11-22 LAB — VITAMIN D 25 HYDROXY (VIT D DEFICIENCY, FRACTURES): VIT D 25 HYDROXY: 33 ng/mL (ref 30–100)

## 2016-11-22 LAB — TSH: TSH: 2.02 mIU/L (ref 0.40–4.50)

## 2016-11-29 ENCOUNTER — Other Ambulatory Visit: Payer: Self-pay

## 2016-11-29 DIAGNOSIS — Z1211 Encounter for screening for malignant neoplasm of colon: Secondary | ICD-10-CM

## 2016-11-29 LAB — POC HEMOCCULT BLD/STL (HOME/3-CARD/SCREEN)
Card #2 Fecal Occult Blod, POC: NEGATIVE
FECAL OCCULT BLD: NEGATIVE
FECAL OCCULT BLD: NEGATIVE

## 2016-12-30 ENCOUNTER — Other Ambulatory Visit: Payer: Self-pay | Admitting: Physician Assistant

## 2017-01-23 ENCOUNTER — Encounter: Payer: Self-pay | Admitting: Adult Health

## 2017-01-28 ENCOUNTER — Other Ambulatory Visit: Payer: Self-pay | Admitting: *Deleted

## 2017-01-28 ENCOUNTER — Other Ambulatory Visit: Payer: Self-pay | Admitting: Internal Medicine

## 2017-01-28 MED ORDER — LANSOPRAZOLE 30 MG PO CPDR: DELAYED_RELEASE_CAPSULE | ORAL | 1 refills | Status: DC

## 2017-03-25 DIAGNOSIS — H04123 Dry eye syndrome of bilateral lacrimal glands: Secondary | ICD-10-CM | POA: Diagnosis not present

## 2017-03-25 DIAGNOSIS — H2513 Age-related nuclear cataract, bilateral: Secondary | ICD-10-CM | POA: Diagnosis not present

## 2017-03-25 DIAGNOSIS — H40013 Open angle with borderline findings, low risk, bilateral: Secondary | ICD-10-CM | POA: Diagnosis not present

## 2017-03-25 DIAGNOSIS — H25013 Cortical age-related cataract, bilateral: Secondary | ICD-10-CM | POA: Diagnosis not present

## 2017-03-25 LAB — HM DIABETES EYE EXAM

## 2017-03-31 ENCOUNTER — Encounter: Payer: Self-pay | Admitting: *Deleted

## 2017-05-21 NOTE — Progress Notes (Signed)
FOLLOW UP  Assessment and Plan:   Hypertension Well controlled with current medications  Monitor blood pressure at home; patient to call if consistently greater than 130/80 Continue DASH diet.   Reminder to go to the ER if any CP, SOB, nausea, dizziness, severe HA, changes vision/speech, left arm numbness and tingling and jaw pain.  Cholesterol Currently at goal by lifestyle only Continue low cholesterol diet and exercise.  Defer lipids to CPE  Other abnormal glucose Last 2 A1Cs at goal; recent hx of prediabetes Continue diet and exercise.  Perform daily foot/skin check, notify office of any concerning changes.  Check A1C  Morbid Obesity with co morbidities Long discussion about weight loss, diet, and exercise Recommended diet heavy in fruits and veggies and low in animal meats, cheeses, and dairy products, appropriate calorie intake Discussed ideal weight for height and initial weight goal (230 lb) Advised to cut down on eating out and snacking at home -  Will follow up in 3 months  Vitamin D Def At goal at last visit; continue supplementation to maintain goal of 70-100 Defer Vit D level  Continue diet and meds as discussed. Further disposition pending results of labs. Discussed med's effects and SE's.   Over 30 minutes of exam, counseling, chart review, and critical decision making was performed.   Future Appointments  Date Time Provider [place]  11/24/2017  3:00 PM Liane Comber, NP [facility] None    ----------------------------------------------------------------------------------------------------------------------  HPI 57 y.o. male  presents for 3 month follow up on hypertension, cholesterol, diabetes, weight and vitamin D deficiency. He is recovering from a flare of back pain last week, but reports he is recovering well after using some ibuprofen. He reports he did have left sided shooting pain with sciatica but this is resolved.   BMI is Body mass  index is 36.39 kg/m., he has not been working on diet and exercise- went on a large 30 year anniversary trip to [place] courtesy of his son. He has previously lost weight down to 212 lb, also previously a Airline pilot, but has little motivation at this time to improve. Discussed risks/benefits and barriers at length.  Wt Readings from Last 3 Encounters:  05/22/17 250 lb (113.4 kg)  11/21/16 227 lb 9.6 oz (103.2 kg)  05/24/16 216 lb (98 kg)   His blood pressure has been controlled at home, today their BP is BP: 112/84  He does not workout. He denies chest pain, shortness of breath, dizziness.   He is not on cholesterol medication and denies myalgias. His cholesterol is at goal. The cholesterol last visit was:   Lab Results  Component Value Date   CHOL 173 11/21/2016   HDL 58 11/21/2016   LDLCALC 100 (H) 11/21/2016   TRIG 65 11/21/2016   CHOLHDL 3.0 11/21/2016    He has not been working on diet and exercise for prediabetes, and denies foot ulcerations, hyperglycemia, increased appetite, nausea, paresthesia of the feet, polydipsia, polyuria, visual disturbances, vomiting and weight loss. Last A1C in the office was:  Lab Results  Component Value Date   HGBA1C 5.3 11/21/2016   Patient is on Vitamin D supplement.   Lab Results  Component Value Date   VD25OH 33 11/21/2016        Current Medications:  Current Outpatient Medications on File Prior to Visit  Medication Sig  . fexofenadine (ALLEGRA) 180 MG tablet Take 180 mg by mouth daily.  . hydrocortisone-pramoxine (ANALPRAM-HC) 2.5-1 % rectal cream U REC TID  . lansoprazole (  PREVACID) 30 MG capsule TAKE 1 CAPSULE(30 MG) BY MOUTH DAILY  . losartan (COZAAR) 50 MG tablet TAKE 1 TABLET(50 MG) BY MOUTH DAILY  . OVER THE COUNTER MEDICATION 1,500 mg 3 (three) times daily. Turmeric Curcumin  . ranitidine (ZANTAC) 300 MG tablet Take 1 tablet (300 mg total) by mouth at bedtime.  Marland Kitchen VITAMIN E PO Take by mouth.   No current  facility-administered medications on file prior to visit.      Allergies:  Allergies  Allergen Reactions  . Iodides Hives     Medical History:  Past Medical History:  Diagnosis Date  . Allergic rhinitis   . Anemia   . Chest pain   . GERD (gastroesophageal reflux disease)   . GERD (gastroesophageal reflux disease) 08/01/2013  . Hypertension   . Obesity   . Tendonitis   . Vitamin B12 deficiency    Family history- Reviewed and unchanged Social history- Reviewed and unchanged   Review of Systems:  Review of Systems  Constitutional: Negative for malaise/fatigue and weight loss.  HENT: Negative for hearing loss and tinnitus.   Eyes: Negative for blurred vision and double vision.  Respiratory: Negative for cough, shortness of breath and wheezing.   Cardiovascular: Negative for chest pain, palpitations, orthopnea, claudication and leg swelling.  Gastrointestinal: Negative for abdominal pain, blood in stool, constipation, diarrhea, heartburn, melena, nausea and vomiting.  Genitourinary: Negative.   Musculoskeletal: Negative for joint pain and myalgias.  Skin: Negative for rash.  Neurological: Negative for dizziness, tingling, sensory change, weakness and headaches.  Endo/Heme/Allergies: Negative for polydipsia.  Psychiatric/Behavioral: Negative.   All other systems reviewed and are negative.     Physical Exam: BP 112/84   Pulse 91   Temp (!) 97.5 F (36.4 C)   Ht 5' 9.5" (1.765 m)   Wt 250 lb (113.4 kg)   SpO2 96%   BMI 36.39 kg/m  Wt Readings from Last 3 Encounters:  05/22/17 250 lb (113.4 kg)  11/21/16 227 lb 9.6 oz (103.2 kg)  05/24/16 216 lb (98 kg)   General Appearance: Well nourished, obese, in no apparent distress. Eyes: PERRLA, EOMs, conjunctiva no swelling or erythema Sinuses: No Frontal/maxillary tenderness ENT/Mouth: Ext aud canals clear, TMs without erythema, bulging. No erythema, swelling, or exudate on post pharynx.  Tonsils not swollen or  erythematous. Hearing normal.  Neck: Supple, thyroid normal.  Respiratory: Respiratory effort normal, BS equal bilaterally without rales, rhonchi, wheezing or stridor.  Cardio: RRR with no MRGs. Brisk peripheral pulses without edema.  Abdomen: Soft, + BS.  Non tender, no guarding, rebound, hernias, masses. Lymphatics: Non tender without lymphadenopathy.  Musculoskeletal: Full ROM, 5/5 strength, Normal gait Skin: Warm, dry without rashes, lesions, ecchymosis.  Neuro: Cranial nerves intact. No cerebellar symptoms.  Psych: Awake and oriented X 3, normal affect, Insight and Judgment appropriate.    Izora Ribas, NP 4:45 PM [facility]

## 2017-05-22 ENCOUNTER — Ambulatory Visit (INDEPENDENT_AMBULATORY_CARE_PROVIDER_SITE_OTHER): Payer: BLUE CROSS/BLUE SHIELD | Admitting: Adult Health

## 2017-05-22 ENCOUNTER — Encounter: Payer: Self-pay | Admitting: Adult Health

## 2017-05-22 VITALS — BP 112/84 | HR 91 | Temp 97.5°F | Ht 69.5 in | Wt 250.0 lb

## 2017-05-22 DIAGNOSIS — K760 Fatty (change of) liver, not elsewhere classified: Secondary | ICD-10-CM

## 2017-05-22 DIAGNOSIS — I10 Essential (primary) hypertension: Secondary | ICD-10-CM | POA: Diagnosis not present

## 2017-05-22 DIAGNOSIS — R7309 Other abnormal glucose: Secondary | ICD-10-CM | POA: Diagnosis not present

## 2017-05-22 DIAGNOSIS — Z79899 Other long term (current) drug therapy: Secondary | ICD-10-CM

## 2017-05-22 DIAGNOSIS — E559 Vitamin D deficiency, unspecified: Secondary | ICD-10-CM

## 2017-05-22 DIAGNOSIS — K219 Gastro-esophageal reflux disease without esophagitis: Secondary | ICD-10-CM

## 2017-05-22 NOTE — Patient Instructions (Addendum)
Recommend goal of 1 lb per week weight loss; ~20 lb at next visit - this is for your heart and joints - once the damage is done, we cannot reverse it. My goal is to keep you off of medications and with as few medical problems as possible -    Aim for 7+ servings of fruits and vegetables daily  80+ fluid ounces of water or unsweet tea for healthy kidneys  1 drink of alcohol per day  Limit animal fats in diet for cholesterol and heart health - choose grass fed whenever available  Aim for low stress - take time to unwind and care for your mental health  Aim for 150 min of moderate intensity exercise weekly for heart health, and weights twice weekly for bone health  Aim for 7-9 hours of sleep daily      When it comes to diets, agreement about the perfect plan isn't easy to find, even among the experts. Experts at the [employer] developed an idea known as the Healthy Eating Plate. Just imagine a plate divided into logical, healthy portions.  The emphasis is on diet quality:  Load up on vegetables and fruits - one-half of your plate: Aim for color and variety, and remember that potatoes don't count.  Go for whole grains - one-quarter of your plate: Whole wheat, barley, wheat berries, quinoa, oats, brown rice, and foods made with them. If you want pasta, go with whole wheat pasta.  Protein power - one-quarter of your plate: Fish, chicken, beans, and nuts are all healthy, versatile protein sources. Limit red meat.  The diet, however, does go beyond the plate, offering a few other suggestions.  Use healthy plant oils, such as olive, canola, soy, corn, sunflower and peanut. Check the labels, and avoid partially hydrogenated oil, which have unhealthy trans fats.  If you're thirsty, drink water. Coffee and tea are good in moderation, but skip sugary drinks and limit milk and dairy products to one or two daily servings.  The type of carbohydrate in the diet is more  important than the amount. Some sources of carbohydrates, such as vegetables, fruits, whole grains, and beans-are healthier than others.  Finally, stay active.

## 2017-05-23 LAB — BASIC METABOLIC PANEL WITH GFR
BUN: 19 mg/dL (ref 7–25)
CO2: 28 mmol/L (ref 20–32)
CREATININE: 0.99 mg/dL (ref 0.70–1.33)
Calcium: 9.4 mg/dL (ref 8.6–10.3)
Chloride: 105 mmol/L (ref 98–110)
GFR, EST AFRICAN AMERICAN: 100 mL/min/{1.73_m2} (ref >=60)
GFR, Est Non African American: 86 mL/min/{1.73_m2} (ref >=60)
GLUCOSE: 88 mg/dL (ref 65–99)
Potassium: 4.2 mmol/L (ref 3.5–5.3)
SODIUM: 141 mmol/L (ref 135–146)

## 2017-05-23 LAB — CBC WITH DIFFERENTIAL/PLATELET
BASOS ABS: 49 {cells}/uL (ref 0–200)
BASOS PCT: 0.6 %
EOS ABS: 178 {cells}/uL (ref 15–500)
Eosinophils Relative: 2.2 %
HCT: 43.1 % (ref 38.5–50.0)
HEMOGLOBIN: 15 g/dL (ref 13.2–17.1)
Lymphs Abs: 1580 cells/uL (ref 850–3900)
MCH: 30.2 pg (ref 27.0–33.0)
MCHC: 34.8 g/dL (ref 32.0–36.0)
MCV: 86.9 fL (ref 80.0–100.0)
MONOS PCT: 7.5 %
MPV: 11.1 fL (ref 7.5–12.5)
Neutro Abs: 5686 cells/uL (ref 1500–7800)
Neutrophils Relative %: 70.2 %
PLATELETS: 225 10*3/uL (ref 140–400)
RBC: 4.96 10*6/uL (ref 4.20–5.80)
RDW: 12.8 % (ref 11.0–15.0)
TOTAL LYMPHOCYTE: 19.5 %
WBC: 8.1 10*3/uL (ref 3.8–10.8)
WBCMIX: 608 {cells}/uL (ref 200–950)

## 2017-05-23 LAB — HEPATIC FUNCTION PANEL
AG RATIO: 2 (calc) (ref 1.0–2.5)
ALKALINE PHOSPHATASE (APISO): 58 U/L (ref 40–115)
ALT: 21 U/L (ref 9–46)
AST: 23 U/L (ref 10–35)
Albumin: 4.6 g/dL (ref 3.6–5.1)
BILIRUBIN DIRECT: 0.2 mg/dL (ref 0.0–0.2)
BILIRUBIN INDIRECT: 0.7 mg/dL (ref 0.2–1.2)
GLOBULIN: 2.3 g/dL (ref 1.9–3.7)
TOTAL PROTEIN: 6.9 g/dL (ref 6.1–8.1)
Total Bilirubin: 0.9 mg/dL (ref 0.2–1.2)

## 2017-05-23 LAB — VITAMIN D 25 HYDROXY (VIT D DEFICIENCY, FRACTURES): Vit D, 25-Hydroxy: 57 ng/mL (ref 30–100)

## 2017-05-23 LAB — HEMOGLOBIN A1C
EAG (MMOL/L): 6 (calc)
Hgb A1c MFr Bld: 5.4 % of total Hgb (ref <5.7)
Mean Plasma Glucose: 108 (calc)

## 2017-07-01 ENCOUNTER — Other Ambulatory Visit: Payer: Self-pay | Admitting: Internal Medicine

## 2017-08-03 ENCOUNTER — Other Ambulatory Visit: Payer: Self-pay | Admitting: Internal Medicine

## 2017-09-28 ENCOUNTER — Other Ambulatory Visit: Payer: Self-pay | Admitting: Internal Medicine

## 2017-10-07 DIAGNOSIS — Z86018 Personal history of other benign neoplasm: Secondary | ICD-10-CM | POA: Diagnosis not present

## 2017-10-07 DIAGNOSIS — L821 Other seborrheic keratosis: Secondary | ICD-10-CM | POA: Diagnosis not present

## 2017-10-07 DIAGNOSIS — L814 Other melanin hyperpigmentation: Secondary | ICD-10-CM | POA: Diagnosis not present

## 2017-10-07 DIAGNOSIS — D1801 Hemangioma of skin and subcutaneous tissue: Secondary | ICD-10-CM | POA: Diagnosis not present

## 2017-11-01 ENCOUNTER — Other Ambulatory Visit: Payer: Self-pay | Admitting: Internal Medicine

## 2017-11-21 ENCOUNTER — Encounter: Payer: Self-pay | Admitting: Adult Health

## 2017-11-23 NOTE — Progress Notes (Signed)
Complete Physical  Assessment and Plan:  Murrel was seen today for annual exam.  Diagnoses and all orders for this visit:  Encounter for general adult medical examination with abnormal findings  Essential hypertension -     Continue medication: losartan 50 mg daily -     Check BP at home, call if over 130/80 -     Magnesium       -     EKG 12-Lead  Allergic rhinitis, unspecified seasonality, unspecified trigger       -      Avoid triggers, OTC allergy medication PRN  Gastroesophageal reflux disease without esophagitis -     Continue PPI/H2 inhibitor -     Avoid triggers, advised to lose weight -      Magnesium  Fatty liver -     Discussed reducing alcohol intake, avoid tylenol use -     CMP/GFR  DDD (degenerative disc disease), lumbar      -      Stable, declines PT/ortho referral at this time  Morbid Obesity Long discussion about weight loss, diet, and exercise Recommended diet heavy in fruits and veggies and low in animal meats, cheeses, and dairy products, appropriate calorie intake Patient will work on adding exercise, portion control Discussed appropriate weight for height (below 170 lb) and initial goal (<200 lb) Follow up at next visit -     TSH -     Hemoglobin A1c -     Insulin, fasting -     Discussed diet and exercise, need for weight loss. Pt aware, states is an issue of self-motivation.   Other abnormal glucose -    Currently at goal with lifestyle changes -     TSH -     Hemoglobin A1c  Screening for hematuria or proteinuria -    UA -     Microalbumin / creatinine urine ratio  Vitamin D deficiency/ osteoporosis prevention -     VITAMIN D 25 Hydroxy (Vit-D Deficiency, Fractures)  Prostate cancer screening -     PSA  Neck pain Hx of fusion; intermittent pain x 3-4 weeks, "feels like nerve pain" Declines imaging at this time, agreeable to steroid taper Call back if worsening, or with any weakness/numbness of upper extremities  Discussed med's  effects and SE's. Screening labs and tests as requested with regular follow-up as recommended. Over 40 minutes of exam, counseling, chart review and critical decision making was performed  Future Appointments  Date Time Provider [place]  05/27/2018  4:30 PM Liane Comber, NP [facility] None  11/25/2018  3:00 PM Liane Comber, NP [facility] None     HPI A 57 y.o. 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 He has HTN (hypertension); GERD (gastroesophageal reflux disease); Allergic rhinitis; Fatty liver; Morbid obesity ([place]); DDD (degenerative disc disease), lumbar; and Other abnormal glucose on their problem list.  He has intermittent ongoing tingling to L greater toe (onset after a foot procedure at podiatry) for which he has denied interventions.   He fell approx 4 weeks ago and has been having some shoulder/neck achiness, but feeling better today.  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  Wt Readings from Last 3 Encounters:  11/24/17 244 lb (110.7 kg)  05/22/17 250 lb (113.4 kg)  11/21/16 227 lb 9.6 oz (103.2 kg)  His blood pressure has been controlled at home, today their BP is BP: 126/86 He does not workout. He denies chest pain, shortness of breath, dizziness.   He is not on cholesterol medication and denies myalgias. His cholesterol is at goal. The cholesterol last visit was:   Lab Results  Component Value Date   CHOL 173 11/21/2016   HDL 58 11/21/2016   LDLCALC 100 (H) 11/21/2016   TRIG 65 11/21/2016   CHOLHDL 3.0 11/21/2016   He has been working on diet for glucose management, he is not on bASA, he is on ACE/ARB and denies nausea, polydipsia, polyuria, visual disturbances, vomiting and weight loss. Last A1C in the office was at goal:  Lab Results  Component Value Date   HGBA1C 5.4 05/22/2017    Last GFR: Lab Results  Component Value Date   GFRNONAA 86 05/22/2017   Patient is on Vitamin D supplement:    Lab Results  Component Value Date   VD25OH 57 05/22/2017     Last PSA was: Lab Results  Component Value Date   PSA 1.9 11/21/2016    Full review of medical history performed and updated as appropriate.   Current Medications:  Current Outpatient Medications on File Prior to Visit  Medication Sig Dispense Refill  . Cholecalciferol 5000 units capsule Take 5,000 Units by mouth daily.    . fexofenadine (ALLEGRA) 180 MG tablet Take 180 mg by mouth daily.    . hydrocortisone-pramoxine (ANALPRAM-HC) 2.5-1 % rectal cream U REC TID  2  . lansoprazole (PREVACID) 30 MG capsule TAKE 1 CAPSULE BY MOUTH EVERY DAY 90 capsule 0  . losartan (COZAAR) 50 MG tablet TAKE 1 TABLET(50 MG) BY MOUTH DAILY 90 tablet 0  . ranitidine (ZANTAC) 300 MG tablet Take 1 tablet (300 mg total) by mouth at bedtime. 30 tablet 1  . vitamin B-12 (CYANOCOBALAMIN) 500 MCG tablet Take 500 mcg by mouth daily.    Marland Kitchen OVER THE COUNTER MEDICATION 1,500 mg 3 (three) times daily. Turmeric Curcumin     No current facility-administered medications on file prior to visit.    Allergies:  Allergies  Allergen Reactions  . Iodides Hives   Health Maintenance:  Immunization History  Administered Date(s) Administered  . Pneumococcal-Unspecified 02/19/1995  . Tdap 10/07/2012    Tetanus: 2014 Pneumovax: 1997 Prevnar 13: due age 39 Flu vaccine: declines Zostavax: declines  DEXA:  Colonoscopy:06/2011 Stress test 10/2013, normal CT cardiac score 10/2013 normal Echo 2013 CT chest: 11/11/2014 nodule noted; follow up CT done 11/28/2015- nodule stable, no follow up recommended.   Eye: Dr. Herbert Deaner, 01/2017 Dental: [facility], last visit 11/10/2016  Dermatology: Dr. Delman Cheadle, 09/2017  Patient Care Team: Unk Pinto, MD as PCP - General (Internal Medicine) Sable Feil, MD as Consulting Physician  (Gastroenterology) Marica Otter, [place] (Optometry) Josue Hector, MD as Consulting Physician (Cardiology) Jovita Gamma, MD as Consulting Physician (Neurosurgery) Jari Pigg, MD as Consulting Physician (Dermatology)  Medical History:  has HTN (hypertension); GERD (gastroesophageal reflux disease); Allergic rhinitis; Fatty liver; Morbid obesity ([place]); DDD (degenerative disc disease), lumbar; and Other abnormal glucose on their problem list. Surgical History:  He  has a past surgical history that includes Back surgery; Bunionectomy (Left, 02/14/2014); and Cervical disc surgery (2003). Family History:  His family history includes Brain cancer in his paternal uncle; Breast cancer in his mother; Cancer in his maternal grandfather; Esophageal cancer in his father; Heart disease in his maternal uncle and sister; Lung cancer in his mother; Melanoma in his  sister; Ovarian cancer in his sister. Social History:   reports that he has never smoked. He quit smokeless tobacco use about 17 years ago.  His smokeless tobacco use included chew. He reports that he drinks alcohol. He reports that he does not use drugs. Review of Systems:  Review of Systems  Constitutional: Negative for chills, fever and malaise/fatigue.  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   Eyes: Negative for blurred vision and double vision.  Respiratory: Negative for cough, hemoptysis, sputum production, shortness of breath and wheezing.   Cardiovascular: Negative for chest pain, palpitations, orthopnea, claudication and leg swelling.  Gastrointestinal: Negative for abdominal pain, blood in stool, constipation, diarrhea, heartburn, melena, nausea and vomiting.  Genitourinary: Negative for flank pain, frequency, hematuria and urgency.  Musculoskeletal: Positive for back pain (Chronic stable low back pain ), falls (mechanical 1 month ago) and neck pain  (intermittent x 3-4 weeks after fall, better today). Negative for myalgias.  Skin: Negative for rash.  Neurological: Positive for tingling (Intermittent, left great toe, started after foot procedure. Declines intervention at this time. ). Negative for dizziness, sensory change, speech change, focal weakness, weakness and headaches.  Endo/Heme/Allergies: Negative for environmental allergies and polydipsia. Does not bruise/bleed easily.  Psychiatric/Behavioral: Negative for depression, memory loss, substance abuse and suicidal ideas. The patient does not have insomnia.     Physical Exam: Estimated body mass index is 36.03 kg/m as calculated from the following:   Height as of this encounter: 5\' 9"  (1.753 m).   Weight as of this encounter: 244 lb (110.7 kg). BP 126/86   Pulse 83   Temp (!) 96.4 F (35.8 C)   Ht 5\' 9"  (1.753 m)   Wt 244 lb (110.7 kg)   SpO2 98%   BMI 36.03 kg/m  General Appearance: Well nourished, in no apparent distress.  Eyes: PERRLA, EOMs, conjunctiva no swelling or erythema.  Sinuses: No Frontal/maxillary tenderness  ENT/Mouth: Ext aud canals clear, normal light reflex with TMs without erythema, bulging. Good dentition. No erythema, swelling, or exudate on post pharynx. Tonsils not swollen or erythematous. Hearing decreased on left (reportedly consistent with baseline).  Neck: Supple, thyroid normal. No bruits  Respiratory: Respiratory effort normal, BS equal bilaterally without rales, rhonchi, wheezing or stridor.  Cardio: RRR without murmurs, rubs or gallops. Brisk peripheral pulses without edema.  Chest: symmetric, with normal excursions and percussion.  Abdomen: Soft, nontender, no guarding, rebound, masses, or organomegaly. No palpable hernias on exam.  Lymphatics: Non tender without lymphadenopathy.  Genitourinary: Defer  Musculoskeletal: Full ROM all peripheral extremities,5/5 strength, and normal gait.  Skin: Warm, dry without rashes, lesions,  ecchymosis. Neuro: Cranial nerves intact, reflexes equal bilaterally. Normal muscle tone, no cerebellar symptoms. Sensation intact.  Psych: Awake and oriented X 3, normal affect, Insight and Judgment appropriate.   EKG: WNL no ST changes   Gorden Harms Jacqueleen Pulver 5:27 PM [facility]

## 2017-11-24 ENCOUNTER — Encounter: Payer: Self-pay | Admitting: Adult Health

## 2017-11-24 ENCOUNTER — Ambulatory Visit (INDEPENDENT_AMBULATORY_CARE_PROVIDER_SITE_OTHER): Payer: BLUE CROSS/BLUE SHIELD | Admitting: Adult Health

## 2017-11-24 VITALS — BP 126/86 | HR 83 | Temp 96.4°F | Ht 69.0 in | Wt 244.0 lb

## 2017-11-24 DIAGNOSIS — Z13220 Encounter for screening for lipoid disorders: Secondary | ICD-10-CM

## 2017-11-24 DIAGNOSIS — Z1389 Encounter for screening for other disorder: Secondary | ICD-10-CM

## 2017-11-24 DIAGNOSIS — R7309 Other abnormal glucose: Secondary | ICD-10-CM

## 2017-11-24 DIAGNOSIS — Z1329 Encounter for screening for other suspected endocrine disorder: Secondary | ICD-10-CM

## 2017-11-24 DIAGNOSIS — E559 Vitamin D deficiency, unspecified: Secondary | ICD-10-CM | POA: Diagnosis not present

## 2017-11-24 DIAGNOSIS — Z79899 Other long term (current) drug therapy: Secondary | ICD-10-CM | POA: Diagnosis not present

## 2017-11-24 DIAGNOSIS — R350 Frequency of micturition: Secondary | ICD-10-CM | POA: Diagnosis not present

## 2017-11-24 DIAGNOSIS — N401 Enlarged prostate with lower urinary tract symptoms: Secondary | ICD-10-CM | POA: Diagnosis not present

## 2017-11-24 DIAGNOSIS — I10 Essential (primary) hypertension: Secondary | ICD-10-CM | POA: Diagnosis not present

## 2017-11-24 DIAGNOSIS — Z130 Encounter for screening for diseases of the blood and blood-forming organs and certain disorders involving the immune mechanism: Secondary | ICD-10-CM | POA: Diagnosis not present

## 2017-11-24 DIAGNOSIS — Z125 Encounter for screening for malignant neoplasm of prostate: Secondary | ICD-10-CM | POA: Diagnosis not present

## 2017-11-24 DIAGNOSIS — Z0000 Encounter for general adult medical examination without abnormal findings: Secondary | ICD-10-CM | POA: Diagnosis not present

## 2017-11-24 DIAGNOSIS — E785 Hyperlipidemia, unspecified: Secondary | ICD-10-CM

## 2017-11-24 DIAGNOSIS — M5136 Other intervertebral disc degeneration, lumbar region: Secondary | ICD-10-CM

## 2017-11-24 DIAGNOSIS — K760 Fatty (change of) liver, not elsewhere classified: Secondary | ICD-10-CM

## 2017-11-24 DIAGNOSIS — Z136 Encounter for screening for cardiovascular disorders: Secondary | ICD-10-CM | POA: Diagnosis not present

## 2017-11-24 DIAGNOSIS — J309 Allergic rhinitis, unspecified: Secondary | ICD-10-CM

## 2017-11-24 DIAGNOSIS — K219 Gastro-esophageal reflux disease without esophagitis: Secondary | ICD-10-CM

## 2017-11-24 DIAGNOSIS — D649 Anemia, unspecified: Secondary | ICD-10-CM

## 2017-11-24 MED ORDER — PREDNISONE 20 MG PO TABS: ORAL_TABLET | ORAL | 0 refills | Status: AC

## 2017-11-24 NOTE — Patient Instructions (Addendum)
  Mr. Albert Griffin , Thank you for taking time to come for your Medicare Wellness Visit. I appreciate your ongoing commitment to your health goals. Please review the following plan we discussed and let me know if I can assist you in the future.   These are the goals we discussed: Goals    . Weight (lb) < 200 lb (90.7 kg)       This is a list of the screening recommended for you and due dates:  Health Maintenance  Topic Date Due  . Flu Shot  09/18/2017  . Colon Cancer Screening  07/09/2021  . Tetanus Vaccine  10/08/2022  .  Hepatitis C: One time screening is recommended by Center for Disease Control  (CDC) for  adults born from [birth year] through 1963.   Completed  . HIV Screening  Completed    Know what a healthy weight is for you (roughly BMI <25) and aim to maintain this  Aim for 7+ servings of fruits and vegetables daily  65-80+ fluid ounces of water or unsweet tea for healthy kidneys  Limit to max 1 drink of alcohol per day; avoid smoking/tobacco  Limit animal fats in diet for cholesterol and heart health - choose grass fed whenever available  Avoid highly processed foods, and foods high in saturated/trans fats  Aim for low stress - take time to unwind and care for your mental health  Aim for 150 min of moderate intensity exercise weekly for heart health, and weights twice weekly for bone health  Aim for 7-9 hours of sleep daily      When it comes to diets, agreement about the perfect plan isn't easy to find, even among the experts. Experts at the [employer] developed an idea known as the Healthy Eating Plate. Just imagine a plate divided into logical, healthy portions.  The emphasis is on diet quality:  Load up on vegetables and fruits - one-half of your plate: Aim for color and variety, and remember that potatoes don't count.  Go for whole grains - one-quarter of your plate: Whole wheat, barley, wheat berries, quinoa, oats, brown rice, and foods made  with them. If you want pasta, go with whole wheat pasta.  Protein power - one-quarter of your plate: Fish, chicken, beans, and nuts are all healthy, versatile protein sources. Limit red meat.  The diet, however, does go beyond the plate, offering a few other suggestions.  Use healthy plant oils, such as olive, canola, soy, corn, sunflower and peanut. Check the labels, and avoid partially hydrogenated oil, which have unhealthy trans fats.  If you're thirsty, drink water. Coffee and tea are good in moderation, but skip sugary drinks and limit milk and dairy products to one or two daily servings.  The type of carbohydrate in the diet is more important than the amount. Some sources of carbohydrates, such as vegetables, fruits, whole grains, and beans-are healthier than others.  Finally, stay active.

## 2017-11-25 LAB — CBC WITH DIFFERENTIAL/PLATELET
BASOS PCT: 0.6 %
Basophils Absolute: 43 cells/uL (ref 0–200)
EOS PCT: 2 %
Eosinophils Absolute: 142 cells/uL (ref 15–500)
HCT: 43.6 % (ref 38.5–50.0)
Hemoglobin: 14.9 g/dL (ref 13.2–17.1)
Lymphs Abs: 1377 cells/uL (ref 850–3900)
MCH: 30.1 pg (ref 27.0–33.0)
MCHC: 34.2 g/dL (ref 32.0–36.0)
MCV: 88.1 fL (ref 80.0–100.0)
MONOS PCT: 7.1 %
MPV: 11.1 fL (ref 7.5–12.5)
NEUTROS PCT: 70.9 %
Neutro Abs: 5034 cells/uL (ref 1500–7800)
PLATELETS: 215 10*3/uL (ref 140–400)
RBC: 4.95 10*6/uL (ref 4.20–5.80)
RDW: 12.4 % (ref 11.0–15.0)
TOTAL LYMPHOCYTE: 19.4 %
WBC mixed population: 504 cells/uL (ref 200–950)
WBC: 7.1 10*3/uL (ref 3.8–10.8)

## 2017-11-25 LAB — MAGNESIUM: Magnesium: 1.9 mg/dL (ref 1.5–2.5)

## 2017-11-25 LAB — VITAMIN B12: Vitamin B-12: 1163 pg/mL — ABNORMAL HIGH (ref 200–1100)

## 2017-11-25 LAB — TSH: TSH: 2.3 mIU/L (ref 0.40–4.50)

## 2017-11-25 LAB — HEMOGLOBIN A1C
Hgb A1c MFr Bld: 5.6 % of total Hgb (ref <5.7)
MEAN PLASMA GLUCOSE: 114 (calc)
eAG (mmol/L): 6.3 (calc)

## 2017-11-25 LAB — LIPID PANEL
CHOL/HDL RATIO: 3.2 (calc) (ref <5.0)
Cholesterol: 172 mg/dL (ref <200)
HDL: 54 mg/dL (ref >40)
LDL CHOLESTEROL (CALC): 99 mg/dL
Non-HDL Cholesterol (Calc): 118 mg/dL (calc) (ref <130)
TRIGLYCERIDES: 94 mg/dL (ref <150)

## 2017-11-25 LAB — VITAMIN D 25 HYDROXY (VIT D DEFICIENCY, FRACTURES): Vit D, 25-Hydroxy: 78 ng/mL (ref 30–100)

## 2017-11-25 LAB — URINALYSIS W MICROSCOPIC + REFLEX CULTURE
BACTERIA UA: NONE SEEN /HPF
Bilirubin Urine: NEGATIVE
Glucose, UA: NEGATIVE
HGB URINE DIPSTICK: NEGATIVE
HYALINE CAST: NONE SEEN /LPF
Ketones, ur: NEGATIVE
Leukocyte Esterase: NEGATIVE
Nitrites, Initial: NEGATIVE
PROTEIN: NEGATIVE
RBC / HPF: NONE SEEN /HPF (ref 0–2)
Specific Gravity, Urine: 1.016 (ref 1.001–1.03)
Squamous Epithelial / LPF: NONE SEEN /HPF (ref <=5)
WBC UA: NONE SEEN /HPF (ref 0–5)
pH: 6 (ref 5.0–8.0)

## 2017-11-25 LAB — COMPLETE METABOLIC PANEL WITH GFR
AG RATIO: 2.4 (calc) (ref 1.0–2.5)
ALT: 26 U/L (ref 9–46)
AST: 22 U/L (ref 10–35)
Albumin: 4.6 g/dL (ref 3.6–5.1)
Alkaline phosphatase (APISO): 57 U/L (ref 40–115)
BUN: 13 mg/dL (ref 7–25)
CALCIUM: 9.7 mg/dL (ref 8.6–10.3)
CO2: 28 mmol/L (ref 20–32)
CREATININE: 0.97 mg/dL (ref 0.70–1.33)
Chloride: 105 mmol/L (ref 98–110)
GFR, EST AFRICAN AMERICAN: 101 mL/min/{1.73_m2} (ref >=60)
GFR, EST NON AFRICAN AMERICAN: 88 mL/min/{1.73_m2} (ref >=60)
Globulin: 1.9 g/dL (calc) (ref 1.9–3.7)
Glucose, Bld: 82 mg/dL (ref 65–99)
POTASSIUM: 4 mmol/L (ref 3.5–5.3)
Sodium: 142 mmol/L (ref 135–146)
TOTAL PROTEIN: 6.5 g/dL (ref 6.1–8.1)
Total Bilirubin: 0.8 mg/dL (ref 0.2–1.2)

## 2017-11-25 LAB — MICROALBUMIN / CREATININE URINE RATIO
CREATININE, URINE: 144 mg/dL (ref 20–320)
Microalb Creat Ratio: 3 mcg/mg creat (ref <30)
Microalb, Ur: 0.4 mg/dL

## 2017-11-25 LAB — PSA: PSA: 1.8 ng/mL (ref <=4.0)

## 2017-11-25 LAB — NO CULTURE INDICATED

## 2017-12-31 ENCOUNTER — Other Ambulatory Visit: Payer: Self-pay | Admitting: Internal Medicine

## 2018-01-25 ENCOUNTER — Other Ambulatory Visit: Payer: Self-pay | Admitting: Internal Medicine

## 2018-03-30 DIAGNOSIS — H524 Presbyopia: Secondary | ICD-10-CM | POA: Diagnosis not present

## 2018-03-30 DIAGNOSIS — H25013 Cortical age-related cataract, bilateral: Secondary | ICD-10-CM | POA: Diagnosis not present

## 2018-03-30 DIAGNOSIS — H04123 Dry eye syndrome of bilateral lacrimal glands: Secondary | ICD-10-CM | POA: Diagnosis not present

## 2018-03-30 DIAGNOSIS — H2513 Age-related nuclear cataract, bilateral: Secondary | ICD-10-CM | POA: Diagnosis not present

## 2018-03-30 DIAGNOSIS — H40013 Open angle with borderline findings, low risk, bilateral: Secondary | ICD-10-CM | POA: Diagnosis not present

## 2018-03-30 LAB — HM DIABETES EYE EXAM

## 2018-04-06 ENCOUNTER — Encounter: Payer: Self-pay | Admitting: *Deleted

## 2018-04-09 ENCOUNTER — Telehealth: Payer: Self-pay | Admitting: *Deleted

## 2018-04-09 ENCOUNTER — Other Ambulatory Visit: Payer: Self-pay | Admitting: Internal Medicine

## 2018-04-09 MED ORDER — PREDNISONE 20 MG PO TABS: ORAL_TABLET | ORAL | 0 refills | Status: DC

## 2018-04-09 MED ORDER — AZITHROMYCIN 250 MG PO TABS: ORAL_TABLET | ORAL | 1 refills | Status: DC

## 2018-04-09 MED FILL — AZITHROMYCIN 250 MG TABLET: 250 | 5 days supply | Qty: 6 | Fill #0

## 2018-04-09 MED FILL — predniSONE 20 MG TABS: 20 | 11 days supply | Qty: 20 | Fill #0

## 2018-04-09 NOTE — Telephone Encounter (Signed)
Patient called and reported he has a fever, chills, sore throat, with sinus drainage and cough.  The patient was informed RX's has been sent to his pharmacy by Dr Melford Aase.

## 2018-04-13 ENCOUNTER — Other Ambulatory Visit: Payer: Self-pay | Admitting: Internal Medicine

## 2018-05-25 NOTE — Progress Notes (Signed)
FOLLOW UP  Assessment and Plan:   Hypertension Well controlled with current medications  Monitor blood pressure at home; patient to call if consistently greater than 130/80 Continue DASH diet.   Reminder to go to the ER if any CP, SOB, nausea, dizziness, severe HA, changes vision/speech, left arm numbness and tingling and jaw pain.  Cholesterol Currently at goal by lifestyle only Continue low cholesterol diet and exercise.  Defer lipids to CPE  Other abnormal glucose Last 2 A1Cs at goal; recent hx of prediabetes Continue diet and exercise.  Perform daily foot/skin check, notify office of any concerning changes.  Check A1C  Morbid Obesity with co morbidities Long discussion about weight loss, diet, and exercise Recommended diet heavy in fruits and veggies and low in animal meats, cheeses, and dairy products, appropriate calorie intake Discussed ideal weight for height and initial weight goal (230 lb) He has stopped eating out, advised increase vegetable intake, portion control Will follow up in 3 months  Vitamin D Def At goal at last visit; continue supplementation to maintain goal of 70-100 Defer Vit D level  Continue diet and meds as discussed. Further disposition pending results of labs. Discussed med's effects and SE's.   Over 30 minutes of exam, counseling, chart review, and critical decision making was performed.   Future Appointments  Date Time Provider [place]  11/25/2018  3:00 PM Liane Comber, NP [facility] None    ----------------------------------------------------------------------------------------------------------------------  HPI 58 y.o. male  presents for 3 month follow up on hypertension, cholesterol, prediabetes, morbid obesity, GERD and vitamin D deficiency.   he has a diagnosis of GERD which is currently managed by lansoprazole 30 mg daily in AM; will take ranitidine 300 mg if needed in evening.  he reports symptoms is currently well  controlled, and denies breakthrough reflux, burning in chest, hoarseness or cough.    BMI is Body mass index is 36.18 kg/m., he has not been working on diet and exercise- previously a Airline pilot, but has little motivation at this time to improve. He does work a physically active job. Discussed risks/benefits and barriers at length. He drinks water and Gatorade. He is eating out less due to covid 19, taking food from home (fruit, nuts, cereal in AM, eggs and wheat white bread, chicken/turkery sandwhiches for lunch, protein and veggies for dinner, or salads, almost no red meat). Wt Readings from Last 3 Encounters:  05/27/18 245 lb (111.1 kg)  11/24/17 244 lb (110.7 kg)  05/22/17 250 lb (113.4 kg)   His blood pressure has been controlled at home, today their BP is BP: 138/90  He does not workout. He denies chest pain, shortness of breath, dizziness.   He is not on cholesterol medication and denies myalgias. His cholesterol is at goal. The cholesterol last visit was:   Lab Results  Component Value Date   CHOL 172 11/24/2017   HDL 54 11/24/2017   LDLCALC 99 11/24/2017   TRIG 94 11/24/2017   CHOLHDL 3.2 11/24/2017    He has not been working on diet and exercise for prediabetes, and denies foot ulcerations, hyperglycemia, increased appetite, nausea, paresthesia of the feet, polydipsia, polyuria, visual disturbances, vomiting and weight loss. Last A1C in the office was:  Lab Results  Component Value Date   HGBA1C 5.6 11/24/2017   Patient is on Vitamin D supplement and at goal:    Lab Results  Component Value Date   VD25OH 78 11/24/2017        Current Medications:  Current  Outpatient Medications on File Prior to Visit  Medication Sig  . Cholecalciferol 5000 units capsule Take 5,000 Units by mouth daily.  . fexofenadine (ALLEGRA) 180 MG tablet Take 180 mg by mouth 2 (two) times daily.   . hydrocortisone-pramoxine (ANALPRAM-HC) 2.5-1 % rectal cream as needed.   . lansoprazole  (PREVACID) 30 MG capsule TAKE 1 CAPSULE BY MOUTH EVERY DAY  . losartan (COZAAR) 50 MG tablet TAKE 1 TABLET(50 MG) BY MOUTH DAILY  . ZINC OXIDE PO Take by mouth daily.  . ranitidine (ZANTAC) 300 MG tablet Take 1 tablet (300 mg total) by mouth at bedtime.   No current facility-administered medications on file prior to visit.      Allergies:  Allergies  Allergen Reactions  . Iodides Hives     Medical History:  Past Medical History:  Diagnosis Date  . Allergic rhinitis   . Anemia   . Chest pain   . GERD (gastroesophageal reflux disease)   . GERD (gastroesophageal reflux disease) 08/01/2013  . Hypertension   . Obesity   . Tendonitis   . Vitamin B12 deficiency    Family history- Reviewed and unchanged Social history- Reviewed and unchanged   Review of Systems:  Review of Systems  Constitutional: Negative for malaise/fatigue and weight loss.  HENT: Negative for hearing loss and tinnitus.   Eyes: Negative for blurred vision and double vision.  Respiratory: Negative for cough, shortness of breath and wheezing.   Cardiovascular: Negative for chest pain, palpitations, orthopnea, claudication and leg swelling.  Gastrointestinal: Negative for abdominal pain, blood in stool, constipation, diarrhea, heartburn, melena, nausea and vomiting.  Genitourinary: Negative.   Musculoskeletal: Negative for joint pain and myalgias.  Skin: Negative for rash.  Neurological: Negative for dizziness, tingling, sensory change, weakness and headaches.  Endo/Heme/Allergies: Negative for polydipsia.  Psychiatric/Behavioral: Negative.   All other systems reviewed and are negative.     Physical Exam: BP 138/90   Pulse 93   Temp (!) 97.5 F (36.4 C)   Ht 5\' 9"  (1.753 m)   Wt 245 lb (111.1 kg)   SpO2 97%   BMI 36.18 kg/m  Wt Readings from Last 3 Encounters:  05/27/18 245 lb (111.1 kg)  11/24/17 244 lb (110.7 kg)  05/22/17 250 lb (113.4 kg)   General Appearance: Well nourished, obese, in no  apparent distress. Eyes: PERRLA, EOMs, conjunctiva no swelling or erythema Sinuses: No Frontal/maxillary tenderness ENT/Mouth: Ext aud canals clear, TMs without erythema, bulging. No erythema, swelling, or exudate on post pharynx.  Tonsils not swollen or erythematous. Hearing normal.  Neck: Supple, thyroid normal.  Respiratory: Respiratory effort normal, BS equal bilaterally without rales, rhonchi, wheezing or stridor.  Cardio: RRR with no MRGs. Brisk peripheral pulses without edema.  Abdomen: Soft, + BS.  Non tender, no guarding, rebound, hernias, masses. Lymphatics: Non tender without lymphadenopathy.  Musculoskeletal: Full ROM, 5/5 strength, Normal gait Skin: Warm, dry without rashes, lesions, ecchymosis.  Neuro: Cranial nerves intact. No cerebellar symptoms.  Psych: Awake and oriented X 3, normal affect, Insight and Judgment appropriate.    Izora Ribas, NP 1:31 PM [facility]

## 2018-05-27 ENCOUNTER — Encounter: Payer: Self-pay | Admitting: Adult Health

## 2018-05-27 ENCOUNTER — Other Ambulatory Visit: Payer: Self-pay

## 2018-05-27 ENCOUNTER — Ambulatory Visit: Payer: Self-pay | Admitting: Adult Health

## 2018-05-27 ENCOUNTER — Ambulatory Visit (INDEPENDENT_AMBULATORY_CARE_PROVIDER_SITE_OTHER): Payer: BLUE CROSS/BLUE SHIELD | Admitting: Adult Health

## 2018-05-27 VITALS — BP 138/90 | HR 93 | Temp 97.5°F | Ht 69.0 in | Wt 245.0 lb

## 2018-05-27 DIAGNOSIS — R7309 Other abnormal glucose: Secondary | ICD-10-CM

## 2018-05-27 DIAGNOSIS — Z79899 Other long term (current) drug therapy: Secondary | ICD-10-CM

## 2018-05-27 DIAGNOSIS — I10 Essential (primary) hypertension: Secondary | ICD-10-CM

## 2018-05-27 DIAGNOSIS — K760 Fatty (change of) liver, not elsewhere classified: Secondary | ICD-10-CM | POA: Diagnosis not present

## 2018-05-27 DIAGNOSIS — K219 Gastro-esophageal reflux disease without esophagitis: Secondary | ICD-10-CM | POA: Diagnosis not present

## 2018-05-27 NOTE — Patient Instructions (Addendum)
Goals    . Blood Pressure < 130/80    . HEMOGLOBIN A1C < 5.7    . LDL CALC < 100    . Weight (lb) < 320 lb (145.2 kg)       Aim for 7+ servings of fruits and vegetables daily  65-80+ fluid ounces of water or unsweet tea for healthy kidneys  Limit to max 1 drink of alcohol per day; avoid smoking/tobacco  Limit animal fats in diet for cholesterol and heart health - choose grass fed whenever available  Avoid highly processed foods, and foods high in saturated/trans fats  Aim for low stress - take time to unwind and care for your mental health  Aim for 150 min of moderate intensity exercise weekly for heart health, and weights twice weekly for bone health  Aim for 7-9 hours of sleep daily       When it comes to diets, agreement about the perfect plan isn't easy to find, even among the experts. Experts at the [employer] developed an idea known as the Healthy Eating Plate. Just imagine a plate divided into logical, healthy portions.  The emphasis is on diet quality:  Load up on vegetables and fruits - one-half of your plate: Aim for color and variety, and remember that potatoes don't count.  Go for whole grains - one-quarter of your plate: Whole wheat, barley, wheat berries, quinoa, oats, brown rice, and foods made with them. If you want pasta, go with whole wheat pasta.  Protein power - one-quarter of your plate: Fish, chicken, beans, and nuts are all healthy, versatile protein sources. Limit red meat.  The diet, however, does go beyond the plate, offering a few other suggestions.  Use healthy plant oils, such as olive, canola, soy, corn, sunflower and peanut. Check the labels, and avoid partially hydrogenated oil, which have unhealthy trans fats.  If you're thirsty, drink water. Coffee and tea are good in moderation, but skip sugary drinks and limit milk and dairy products to one or two daily servings.  The type of carbohydrate in the diet is more  important than the amount. Some sources of carbohydrates, such as vegetables, fruits, whole grains, and beans-are healthier than others.  Finally, stay active.

## 2018-05-28 LAB — COMPLETE METABOLIC PANEL WITH GFR
AG Ratio: 2.3 (calc) (ref 1.0–2.5)
ALT: 23 U/L (ref 9–46)
AST: 22 U/L (ref 10–35)
Albumin: 4.4 g/dL (ref 3.6–5.1)
Alkaline phosphatase (APISO): 56 U/L (ref 35–144)
BUN: 16 mg/dL (ref 7–25)
CO2: 26 mmol/L (ref 20–32)
Calcium: 9.5 mg/dL (ref 8.6–10.3)
Chloride: 105 mmol/L (ref 98–110)
Creat: 0.97 mg/dL (ref 0.70–1.33)
GFR, Est African American: 101 mL/min/{1.73_m2} (ref >=60)
GFR, Est Non African American: 88 mL/min/{1.73_m2} (ref >=60)
Globulin: 1.9 g/dL (calc) (ref 1.9–3.7)
Glucose, Bld: 92 mg/dL (ref 65–99)
Potassium: 4.1 mmol/L (ref 3.5–5.3)
Sodium: 139 mmol/L (ref 135–146)
Total Bilirubin: 0.7 mg/dL (ref 0.2–1.2)
Total Protein: 6.3 g/dL (ref 6.1–8.1)

## 2018-05-28 LAB — CBC WITH DIFFERENTIAL/PLATELET
Absolute Monocytes: 595 cells/uL (ref 200–950)
Basophils Absolute: 62 cells/uL (ref 0–200)
Basophils Relative: 1 %
Eosinophils Absolute: 118 cells/uL (ref 15–500)
Eosinophils Relative: 1.9 %
HCT: 43.2 % (ref 38.5–50.0)
Hemoglobin: 15 g/dL (ref 13.2–17.1)
Lymphs Abs: 1457 cells/uL (ref 850–3900)
MCH: 30.2 pg (ref 27.0–33.0)
MCHC: 34.7 g/dL (ref 32.0–36.0)
MCV: 87.1 fL (ref 80.0–100.0)
MPV: 11.1 fL (ref 7.5–12.5)
Monocytes Relative: 9.6 %
Neutro Abs: 3968 cells/uL (ref 1500–7800)
Neutrophils Relative %: 64 %
Platelets: 232 10*3/uL (ref 140–400)
RBC: 4.96 10*6/uL (ref 4.20–5.80)
RDW: 13.1 % (ref 11.0–15.0)
Total Lymphocyte: 23.5 %
WBC: 6.2 10*3/uL (ref 3.8–10.8)

## 2018-05-28 LAB — HEMOGLOBIN A1C
Hgb A1c MFr Bld: 5.8 % of total Hgb — ABNORMAL HIGH (ref <5.7)
Mean Plasma Glucose: 120 (calc)
eAG (mmol/L): 6.6 (calc)

## 2018-05-28 LAB — MAGNESIUM: Magnesium: 2 mg/dL (ref 1.5–2.5)

## 2018-07-16 ENCOUNTER — Other Ambulatory Visit: Payer: Self-pay | Admitting: *Deleted

## 2018-07-16 MED ORDER — LOSARTAN POTASSIUM 50 MG PO TABS: ORAL_TABLET | ORAL | 1 refills | Status: DC

## 2018-07-16 MED FILL — LOSARTAN POTASSIUM 50 MG TA: 50 | 30 days supply | Qty: 30 | Fill #0

## 2018-08-05 ENCOUNTER — Other Ambulatory Visit: Payer: Self-pay | Admitting: *Deleted

## 2018-08-05 MED ORDER — LANSOPRAZOLE 30 MG PO CPDR: 30.0000 mg | DELAYED_RELEASE_CAPSULE | Freq: Every day | ORAL | 1 refills | Status: DC

## 2018-08-05 MED FILL — LANSOPRAZOLE 30 MG CPDR: 30 | 90 days supply | Qty: 90 | Fill #0

## 2018-08-14 MED FILL — LOSARTAN POTASSIUM 50 MG TA: 50 | 30 days supply | Qty: 30 | Fill #0

## 2018-09-15 MED FILL — LOSARTAN POTASSIUM 50 MG TA: 50 | 30 days supply | Qty: 30 | Fill #1

## 2018-10-09 MED FILL — LOSARTAN POTASSIUM 50 MG TA: 50 | 30 days supply | Qty: 30 | Fill #2

## 2018-10-13 DIAGNOSIS — Z86018 Personal history of other benign neoplasm: Secondary | ICD-10-CM | POA: Diagnosis not present

## 2018-10-13 DIAGNOSIS — L821 Other seborrheic keratosis: Secondary | ICD-10-CM | POA: Diagnosis not present

## 2018-10-13 DIAGNOSIS — D225 Melanocytic nevi of trunk: Secondary | ICD-10-CM | POA: Diagnosis not present

## 2018-10-13 DIAGNOSIS — L814 Other melanin hyperpigmentation: Secondary | ICD-10-CM | POA: Diagnosis not present

## 2018-11-02 MED FILL — LANSOPRAZOLE 30 MG CPDR: 30 | 90 days supply | Qty: 90 | Fill #1

## 2018-11-08 MED FILL — LOSARTAN POTASSIUM 50 MG TA: 50 | 30 days supply | Qty: 30 | Fill #3

## 2018-11-24 ENCOUNTER — Encounter: Payer: Self-pay | Admitting: Adult Health

## 2018-11-24 NOTE — Progress Notes (Signed)
Complete Physical  Assessment and Plan:  Albert Griffin was seen today for annual exam.  Diagnoses and all orders for this visit:  Encounter for general adult medical examination with abnormal findings  Essential hypertension -     Continue medication: losartan 50 mg daily -     Improved at recheck  -     Check BP at home, call if over 130/80 -     Magnesium       -     EKG 12-Lead  Allergic rhinitis, unspecified seasonality, unspecified trigger       -      Avoid triggers, OTC allergy medication PRN  Gastroesophageal reflux disease without esophagitis -     Continue PPI/H2 inhibitor -     Avoid triggers, advised to lose weight -      Magnesium  Fatty liver -     Discussed reducing alcohol intake, avoid tylenol use, weight loss and low carb diet recommended -     CMP/GFR  DDD (degenerative disc disease), lumbar      -      Stable, declines PT/ortho referral at this time  Morbid Obesity Long discussion about weight loss, diet, and exercise Recommended diet heavy in fruits and veggies and low in animal meats, cheeses, and dairy products, appropriate calorie intake Patient will work on adding exercise, portion control Discussed appropriate weight for height (below 170 lb) and initial goal (<220 lb) Follow up at next visit -     TSH -     Hemoglobin A1c -     Discussed diet and exercise, need for weight loss. Pt reports motivated to work on this after today's visit  Other abnormal glucose -     Currently at goal with lifestyle changes -     TSH -     Hemoglobin A1c -     UA with microscopic  Vitamin D deficiency/ osteoporosis prevention -     VITAMIN D 25 Hydroxy (Vit-D Deficiency, Fractures)  Prostate cancer screening -     PSA  Discussed med's effects and SE's. Screening labs and tests as requested with regular follow-up as recommended. Over 40 minutes of exam, counseling, chart review and critical decision making was performed  Future Appointments  Date Time Provider  [place]  05/31/2019  4:30 PM Liane Comber, NP [facility] None  11/25/2019  3:00 PM Liane Comber, NP [facility] None     HPI [name] presents for a complete physical. He has HTN (hypertension); GERD (gastroesophageal reflux disease); Allergic rhinitis; Fatty liver; Morbid obesity ([place]); DDD (degenerative disc disease), lumbar; and Other abnormal glucose (prediabetes) on their problem list.   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   He has intermittent ongoing tingling to L greater toe (onset after a foot procedure at podiatry) for which he has denied interventions.   he has a diagnosis of GERD which is currently managed by prevacid daily; discussed H2i inhibitor  he reports symptoms is currently well controlled, and denies breakthrough reflux, burning in chest, hoarseness or cough.    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 He knows he needs to work on diet; drank a lot of beer this summer, eating a lot of fast food. He is making some changes, eating sandwich instead of takeout at lunch. He would  like to get down to 170 lb eventually.  Wt Readings from Last 3 Encounters:  11/25/18 251 lb (113.9 kg)  05/27/18 245 lb (111.1 kg)  11/24/17 244 lb (110.7 kg)   His blood pressure has been controlled at home, today their BP is BP: 128/82 He does not workout but works a physically intense job. He denies chest pain, shortness of breath, dizziness.   He is not on cholesterol medication and denies myalgias. His cholesterol is at goal. The cholesterol last visit was:   Lab Results  Component Value Date   CHOL 172 11/24/2017   HDL 54 11/24/2017   LDLCALC 99 11/24/2017   TRIG 94 11/24/2017   CHOLHDL 3.2 11/24/2017   He has been working on diet for glucose management, he is not on bASA, he is on ACE/ARB  and denies nausea, polydipsia, polyuria, visual disturbances, vomiting and weight loss. Last A1C in the office was at goal:  Lab Results  Component Value Date   HGBA1C 5.8 (H) 05/27/2018   Last GFR: Lab Results  Component Value Date   GFRNONAA 88 05/27/2018   Patient is on Vitamin D supplement:    Lab Results  Component Value Date   VD25OH 78 11/24/2017     Last PSA was: Lab Results  Component Value Date   PSA 1.8 11/24/2017   Full review of medical history performed and updated as appropriate.   Current Medications:  Current Outpatient Medications on File Prior to Visit  Medication Sig Dispense Refill  . Cholecalciferol 5000 units capsule Take 5,000 Units by mouth daily.    . fexofenadine (ALLEGRA) 180 MG tablet Take 180 mg by mouth daily.     . hydrocortisone-pramoxine (ANALPRAM-HC) 2.5-1 % rectal cream as needed.   2  . lansoprazole (PREVACID) 30 MG capsule Take 1 capsule (30 mg total) by mouth daily. For indigestion. 90 capsule 1   No current facility-administered medications on file prior to visit.    Allergies:  Allergies  Allergen Reactions  . Iodides Hives   Health Maintenance:  Immunization History  Administered Date(s) Administered  . Pneumococcal-Unspecified 02/19/1995  . Tdap 10/07/2012    Tetanus: 2014 Pneumovax: 1997 Prevnar 13: due age 89 Flu vaccine: declines Zostavax: declines  DEXA:  Colonoscopy:06/2011 Stress test 10/2013, normal CT cardiac score 10/2013 normal Echo 2013 CT chest: 11/11/2014 nodule noted; follow up CT done 11/28/2015- nodule stable, no follow up recommended.   Eye: Dr. Herbert Deaner, 02/2018, glasses Dental: [facility], last visit 2019, needs to schedule Dermatology: Dr. Renda Rolls, 09/2018  Patient Care Team: Unk Pinto, MD as PCP - General (Internal Medicine) Sable Feil, MD as Consulting Physician (Gastroenterology) Marica Otter, [place] (Optometry) Josue Hector, MD as Consulting Physician  (Cardiology) Jovita Gamma, MD as Consulting Physician (Neurosurgery) Jari Pigg, MD as Consulting Physician (Dermatology)  Medical History:  has HTN (hypertension); GERD (gastroesophageal reflux disease); Allergic rhinitis; Fatty liver; Morbid obesity ([place]); DDD (degenerative disc disease), lumbar; and Other abnormal glucose (prediabetes) on their problem list. Surgical History:  He  has a past surgical history that includes Back surgery; Bunionectomy (Left, 02/14/2014); and Cervical disc surgery (2003). Family History:  His family history includes Brain cancer in his paternal uncle; Breast cancer in his mother; COPD in his sister; Cancer in his maternal grandfather; Dementia in his mother; Esophageal cancer in his father; Heart disease in his maternal uncle and sister; Lung cancer in his mother; Melanoma in his sister; Ovarian cancer in his sister. Social History:   reports that  he has never smoked. He quit smokeless tobacco use about 18 years ago.  His smokeless tobacco use included chew. He reports current alcohol use. He reports that he does not use drugs.   Review of Systems:  Review of Systems  Constitutional: Negative for chills, fever and malaise/fatigue.  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   Eyes: Negative for blurred vision and double vision.  Respiratory: Negative for cough, hemoptysis, sputum production, shortness of breath and wheezing.   Cardiovascular: Negative for chest pain, palpitations, orthopnea, claudication and leg swelling.  Gastrointestinal: Negative for abdominal pain, blood in stool, constipation, diarrhea, heartburn, melena, nausea and vomiting.  Genitourinary: Negative for flank pain, frequency, hematuria and urgency.  Musculoskeletal: Positive for back pain (Chronic stable low back pain ). Negative for falls, myalgias and neck pain.  Skin: Negative for rash.  Neurological:  Positive for tingling (Intermittent, left great toe, started after foot procedure. Declines intervention at this time. ). Negative for dizziness, sensory change, speech change, focal weakness, weakness and headaches.  Endo/Heme/Allergies: Negative for environmental allergies and polydipsia. Does not bruise/bleed easily.  Psychiatric/Behavioral: Negative for depression, memory loss, substance abuse and suicidal ideas. The patient does not have insomnia.     Physical Exam: Estimated body mass index is 37.07 kg/m as calculated from the following:   Height as of this encounter: 5\' 9"  (1.753 m).   Weight as of this encounter: 251 lb (113.9 kg). BP 128/82   Pulse 78   Temp (!) 97.3 F (36.3 C)   Ht 5\' 9"  (1.753 m)   Wt 251 lb (113.9 kg)   SpO2 97%   BMI 37.07 kg/m  General Appearance: Well nourished, in no apparent distress.  Eyes: PERRLA, EOMs, conjunctiva no swelling or erythema.  Sinuses: No Frontal/maxillary tenderness  ENT/Mouth: Ext aud canals clear, normal light reflex with TMs without erythema, bulging. Good dentition. No erythema, swelling, or exudate on post pharynx. Tonsils not swollen or erythematous. Hearing decreased on left (reportedly consistent with baseline).  Neck: Supple, thyroid normal. No bruits  Respiratory: Respiratory effort normal, BS equal bilaterally without rales, rhonchi, wheezing or stridor.  Cardio: RRR without murmurs, rubs or gallops. Brisk peripheral pulses without edema.  Chest: symmetric, with normal excursions and percussion.  Abdomen: Soft, obese, nontender, no guarding, rebound, masses, or organomegaly. No palpable hernias on exam.  Lymphatics: Non tender without lymphadenopathy.  Genitourinary: Male genitalia: no penile lesions or discharge no testicular masses (exam chaperoned by Elsie Amis, CMA) Musculoskeletal: Full ROM all peripheral extremities,5/5 strength, and normal gait.  Skin: Warm, dry without rashes, lesions, ecchymosis. Neuro: Cranial  nerves intact, reflexes equal bilaterally. Normal muscle tone, no cerebellar symptoms. Sensation intact.  Psych: Awake and oriented X 3, normal affect, Insight and Judgment appropriate.   EKG: WNL no ST changes, RSR' V1, aVR   Gorden Harms Dona Walby 5:19 PM [facility]

## 2018-11-25 ENCOUNTER — Other Ambulatory Visit: Payer: Self-pay

## 2018-11-25 ENCOUNTER — Ambulatory Visit (INDEPENDENT_AMBULATORY_CARE_PROVIDER_SITE_OTHER): Payer: BLUE CROSS/BLUE SHIELD | Admitting: Adult Health

## 2018-11-25 ENCOUNTER — Encounter: Payer: Self-pay | Admitting: Adult Health

## 2018-11-25 VITALS — BP 128/82 | HR 78 | Temp 97.3°F | Ht 69.0 in | Wt 251.0 lb

## 2018-11-25 DIAGNOSIS — M5136 Other intervertebral disc degeneration, lumbar region: Secondary | ICD-10-CM

## 2018-11-25 DIAGNOSIS — K760 Fatty (change of) liver, not elsewhere classified: Secondary | ICD-10-CM | POA: Diagnosis not present

## 2018-11-25 DIAGNOSIS — Z0000 Encounter for general adult medical examination without abnormal findings: Secondary | ICD-10-CM

## 2018-11-25 DIAGNOSIS — Z131 Encounter for screening for diabetes mellitus: Secondary | ICD-10-CM

## 2018-11-25 DIAGNOSIS — Z13220 Encounter for screening for lipoid disorders: Secondary | ICD-10-CM | POA: Diagnosis not present

## 2018-11-25 DIAGNOSIS — Z125 Encounter for screening for malignant neoplasm of prostate: Secondary | ICD-10-CM | POA: Diagnosis not present

## 2018-11-25 DIAGNOSIS — Z0001 Encounter for general adult medical examination with abnormal findings: Secondary | ICD-10-CM

## 2018-11-25 DIAGNOSIS — E559 Vitamin D deficiency, unspecified: Secondary | ICD-10-CM | POA: Diagnosis not present

## 2018-11-25 DIAGNOSIS — Z136 Encounter for screening for cardiovascular disorders: Secondary | ICD-10-CM | POA: Diagnosis not present

## 2018-11-25 DIAGNOSIS — K219 Gastro-esophageal reflux disease without esophagitis: Secondary | ICD-10-CM

## 2018-11-25 DIAGNOSIS — R350 Frequency of micturition: Secondary | ICD-10-CM | POA: Diagnosis not present

## 2018-11-25 DIAGNOSIS — I10 Essential (primary) hypertension: Secondary | ICD-10-CM

## 2018-11-25 DIAGNOSIS — M51369 Other intervertebral disc degeneration, lumbar region without mention of lumbar back pain or lower extremity pain: Secondary | ICD-10-CM

## 2018-11-25 DIAGNOSIS — N401 Enlarged prostate with lower urinary tract symptoms: Secondary | ICD-10-CM | POA: Diagnosis not present

## 2018-11-25 DIAGNOSIS — Z79899 Other long term (current) drug therapy: Secondary | ICD-10-CM | POA: Diagnosis not present

## 2018-11-25 DIAGNOSIS — J309 Allergic rhinitis, unspecified: Secondary | ICD-10-CM

## 2018-11-25 DIAGNOSIS — R7309 Other abnormal glucose: Secondary | ICD-10-CM

## 2018-11-25 MED ORDER — FAMOTIDINE 20 MG PO TABS: 20.0000 mg | ORAL_TABLET | Freq: Two times a day (BID) | ORAL | 1 refills | Status: DC

## 2018-11-25 MED ORDER — LOSARTAN POTASSIUM 50 MG PO TABS: ORAL_TABLET | ORAL | 1 refills | Status: DC

## 2018-11-25 NOTE — Patient Instructions (Addendum)
Albert Griffin , Thank you for taking time to come for your Annual Wellness Visit. I appreciate your ongoing commitment to your health goals. Please review the following plan we discussed and let me know if I can assist you in the future.   These are the goals we discussed: Goals    . Blood Pressure < 130/80    . HEMOGLOBIN A1C < 5.7    . LDL CALC < 100    . Weight (lb) < 215 lb (97.5 kg)       This is a list of the screening recommended for you and due dates:  Health Maintenance  Topic Date Due  . Flu Shot  05/19/2019*  . Colon Cancer Screening  07/09/2021  . Tetanus Vaccine  10/08/2022  .  Hepatitis C: One time screening is recommended by Center for Disease Control  (CDC) for  adults born from [birth year] through 1963.   Completed  . HIV Screening  Completed  *Topic was postponed. The date shown is not the original due date.   Please focus on weight loss this year Aim for 0.5-2 lb weight loss per week; plan to weigh weekly and keep a log to track progress Make small sustainable changes, focus on improving 1-2 "habits" at a time Try to limit eating out; have a plan of healthy options that you can get if needed when eating out   It is very difficult to remain overweight on a plant-based diet low in sugar and flour.    Some general tips for weight loss  Drink 1/2 your body weight in fluid ounces of water daily; drink a tall glass of water 30 min before meals  Don't eat until you're stuffed- listen to your stomach and eat until you are 80% full  Avoid drinking your calories; alcohol, sweet drinks   Try eating off of a salad plate; wait 10 min after finishing before going back for seconds  Start by eating the vegetables on your plate; aim for 50% of your meals to be fruits or vegetables  Then eat your protein - lean meats (grass fed if possible), fish, beans, nuts in moderation  Eat your carbs/starch last ONLY if you still are hungry. If you can, stop before finishing it  all  Avoid sugar and flour - the closer it looks to it's original form in nature, typically the better it is for you  Splurge in moderation - "assign" days when you get to splurge and have the "bad stuff" - I like to follow a 80% - 20% plan- "good" choices 80 % of the time, "bad" choices in moderation 20% of the time  Simple equation is: Calories out > calories in = weight loss - even if you eat the bad stuff, if you limit portions, you will still lose weight    Know what a healthy weight is for you (roughly BMI <25) and aim to maintain this  Aim for 7+ servings of fruits and vegetables daily  65-80+ fluid ounces of water or unsweet tea for healthy kidneys  Limit to max 1 drink of alcohol per day; avoid smoking/tobacco  Limit animal fats in diet for cholesterol and heart health - choose grass fed whenever available  Avoid highly processed foods, and foods high in saturated/trans fats  Aim for low stress - take time to unwind and care for your mental health  Aim for 150 min of moderate intensity exercise weekly for heart health, and weights twice weekly for bone health  Aim for 7-9 hours of sleep daily     Preventing Health Risks of Being Overweight Maintaining a healthy body weight is an important part of your overall health. Your healthy body weight depends on your age, gender, and height. Being overweight puts you at risk for many health problems, including:  Heart disease.  Diabetes.  Problems sleeping.  Joint problems. You can make changes to your diet and lifestyle to prevent these risks. Consider working with a health care provider or a dietitian to make these changes. What nutrition changes can be made?   Eat only as much as your body needs. In most cases, this is about 2,000 calories a day, but the amount varies depending on your height, gender, and activity level. Ask your health care provider how many calories you should have each day. Eating more than your body  needs on a regular basis can cause you to become overweight or obese.  Eat slowly, and stop eating when you feel full.  Choose healthy foods, including: ? Fruits and vegetables. ? Lean meats. ? Low-fat dairy products. ? High-fiber foods, such as whole grains and beans. ? Healthy snacks like vegetable sticks, a piece of fruit, or a small amount of yogurt or cheese.  Avoid foods and drinks that are high in sugar, salt (sodium), saturated fat, or trans fat. This includes: ? Many desserts such as candy, cookies, and ice cream. ? Soda. ? Fried foods. ? Processed meats such as hot dogs or lunch meats. ? Prepackaged snack foods. What lifestyle changes can be made?   Exercise for at least 150 minutes a week to prevent weight gain, or as often as recommended by your health care provider. Do moderate-intensity exercise, such as brisk walking. ? Spread it out by exercising for 30 minutes 5 days a week, or in short 10-minute bursts several times a day.  Find other ways to stay active and burn calories, such as yard work or a hobby that involves physical activity.  Get at least 8 hours of sleep each night. When you are well-rested, you are more likely to be active and make healthy choices during the day. To sleep better: ? Try to go to bed and wake up at about the same time every day. ? Keep your bedroom dark, quiet, and cool. ? Make sure that your bed is comfortable. ? Avoid stimulating activities, such as watching television or exercising, for at least one hour before bedtime. Why are these changes important? Eating healthy and being active helps you lose weight and prevent health problems caused by being overweight. Making these changes can also help you manage stress, feel better mentally, and connect with friends and family. What can happen if changes are not made? Being overweight can affect you for your entire life. You may develop joint or bone problems that make it painful or difficult  for you to play sports or do activities you enjoy. Being overweight puts stress on your heart and lungs and can lead to medical problems like diabetes, heart disease, and sleeping problems. Where to find support You can get support for preventing health risks of being overweight from:  Your health care provider or a dietitian. They can provide guidance about healthy eating and healthy lifestyle choices.  Weight loss support groups, online or in-person. Where to find more information  MyPlate: FormerBoss.no ? This an online tool that provides personalized recommendations about foods to eat each day.  The Centers for Disease Control and Prevention: http://sharp-hammond.biz/ ?  This resource gives tips for managing weight and having an active lifestyle. Summary  To prevent unhealthy weight gain, it is important to maintain a healthy diet high in vegetables and whole grains, exercise regularly, and get at least 8 hours of sleep each night.  Making these changes helps prevent many long-term (chronic) health conditions that can shorten your life, such as diabetes, heart disease, and stroke. This information is not intended to replace advice given to you by your health care provider. Make sure you discuss any questions you have with your health care provider. Document Released: 01/01/2017 Document Revised: 02/07/2017 Document Reviewed: 01/01/2017 Elsevier Patient Education  2020 [employer].

## 2018-11-26 LAB — URINALYSIS, ROUTINE W REFLEX MICROSCOPIC
Bilirubin Urine: NEGATIVE
Glucose, UA: NEGATIVE
Hgb urine dipstick: NEGATIVE
Ketones, ur: NEGATIVE
Leukocytes,Ua: NEGATIVE
Nitrite: NEGATIVE
Protein, ur: NEGATIVE
Specific Gravity, Urine: 1.013 (ref 1.001–1.03)
pH: 5.5 (ref 5.0–8.0)

## 2018-11-26 LAB — LIPID PANEL
Cholesterol: 172 mg/dL (ref <200)
HDL: 50 mg/dL (ref >=40)
LDL Cholesterol (Calc): 105 mg/dL (calc) — ABNORMAL HIGH
Non-HDL Cholesterol (Calc): 122 mg/dL (calc) (ref <130)
Total CHOL/HDL Ratio: 3.4 (calc) (ref <5.0)
Triglycerides: 81 mg/dL (ref <150)

## 2018-11-26 LAB — CBC WITH DIFFERENTIAL/PLATELET
Absolute Monocytes: 501 cells/uL (ref 200–950)
Basophils Absolute: 33 cells/uL (ref 0–200)
Basophils Relative: 0.5 %
Eosinophils Absolute: 78 cells/uL (ref 15–500)
Eosinophils Relative: 1.2 %
HCT: 41.9 % (ref 38.5–50.0)
Hemoglobin: 14.8 g/dL (ref 13.2–17.1)
Lymphs Abs: 1333 cells/uL (ref 850–3900)
MCH: 31.1 pg (ref 27.0–33.0)
MCHC: 35.3 g/dL (ref 32.0–36.0)
MCV: 88 fL (ref 80.0–100.0)
MPV: 10.8 fL (ref 7.5–12.5)
Monocytes Relative: 7.7 %
Neutro Abs: 4557 cells/uL (ref 1500–7800)
Neutrophils Relative %: 70.1 %
Platelets: 238 10*3/uL (ref 140–400)
RBC: 4.76 10*6/uL (ref 4.20–5.80)
RDW: 12.5 % (ref 11.0–15.0)
Total Lymphocyte: 20.5 %
WBC: 6.5 10*3/uL (ref 3.8–10.8)

## 2018-11-26 LAB — COMPLETE METABOLIC PANEL WITH GFR
AG Ratio: 2.4 (calc) (ref 1.0–2.5)
ALT: 33 U/L (ref 9–46)
AST: 26 U/L (ref 10–35)
Albumin: 4.6 g/dL (ref 3.6–5.1)
Alkaline phosphatase (APISO): 53 U/L (ref 35–144)
BUN: 17 mg/dL (ref 7–25)
CO2: 25 mmol/L (ref 20–32)
Calcium: 9.8 mg/dL (ref 8.6–10.3)
Chloride: 103 mmol/L (ref 98–110)
Creat: 1.1 mg/dL (ref 0.70–1.33)
GFR, Est African American: 87 mL/min/{1.73_m2} (ref >=60)
GFR, Est Non African American: 75 mL/min/{1.73_m2} (ref >=60)
Globulin: 1.9 g/dL (calc) (ref 1.9–3.7)
Glucose, Bld: 101 mg/dL — ABNORMAL HIGH (ref 65–99)
Potassium: 3.9 mmol/L (ref 3.5–5.3)
Sodium: 137 mmol/L (ref 135–146)
Total Bilirubin: 0.9 mg/dL (ref 0.2–1.2)
Total Protein: 6.5 g/dL (ref 6.1–8.1)

## 2018-11-26 LAB — VITAMIN D 25 HYDROXY (VIT D DEFICIENCY, FRACTURES): Vit D, 25-Hydroxy: 64 ng/mL (ref 30–100)

## 2018-11-26 LAB — MAGNESIUM: Magnesium: 2 mg/dL (ref 1.5–2.5)

## 2018-11-26 LAB — HEMOGLOBIN A1C
Hgb A1c MFr Bld: 5.6 % of total Hgb (ref <5.7)
Mean Plasma Glucose: 114 (calc)
eAG (mmol/L): 6.3 (calc)

## 2018-11-26 LAB — PSA: PSA: 1.5 ng/mL (ref <=4.0)

## 2018-11-26 LAB — TSH: TSH: 2.73 mIU/L (ref 0.40–4.50)

## 2018-12-04 MED FILL — FAMOTIDINE 20 MG TABS: 20 | 30 days supply | Qty: 60 | Fill #0

## 2018-12-04 MED FILL — LOSARTAN POTASSIUM 50 MG TA: 50 | 90 days supply | Qty: 90 | Fill #0

## 2018-12-28 ENCOUNTER — Other Ambulatory Visit: Payer: Self-pay | Admitting: Adult Health

## 2018-12-28 MED ORDER — HYDROCORT-PRAMOXINE (PERIANAL) 2.5-1 % EX CREA: TOPICAL_CREAM | Freq: Three times a day (TID) | CUTANEOUS | 2 refills | Status: DC | PRN

## 2018-12-28 MED FILL — HYDROCORTISONE ACE-PRAMOXIN: 2.5-1 | 15 days supply | Qty: 30 | Fill #0

## 2018-12-29 MED FILL — FAMOTIDINE 20 MG TABS: 20 | 30 days supply | Qty: 60 | Fill #1

## 2019-01-01 DIAGNOSIS — M65872 Other synovitis and tenosynovitis, left ankle and foot: Secondary | ICD-10-CM | POA: Diagnosis not present

## 2019-01-01 DIAGNOSIS — M76822 Posterior tibial tendinitis, left leg: Secondary | ICD-10-CM | POA: Diagnosis not present

## 2019-01-01 DIAGNOSIS — M76821 Posterior tibial tendinitis, right leg: Secondary | ICD-10-CM | POA: Diagnosis not present

## 2019-01-26 ENCOUNTER — Other Ambulatory Visit: Payer: Self-pay | Admitting: Internal Medicine

## 2019-01-26 MED FILL — LANSOPRAZOLE 30 MG CPDR: 30 | 90 days supply | Qty: 90 | Fill #0

## 2019-01-27 MED FILL — FAMOTIDINE 20 MG TABS: 20 | 30 days supply | Qty: 60 | Fill #2

## 2019-01-29 DIAGNOSIS — M76822 Posterior tibial tendinitis, left leg: Secondary | ICD-10-CM | POA: Diagnosis not present

## 2019-01-29 DIAGNOSIS — M76821 Posterior tibial tendinitis, right leg: Secondary | ICD-10-CM | POA: Diagnosis not present

## 2019-01-29 DIAGNOSIS — M65872 Other synovitis and tenosynovitis, left ankle and foot: Secondary | ICD-10-CM | POA: Diagnosis not present

## 2019-02-04 ENCOUNTER — Other Ambulatory Visit: Payer: Self-pay | Admitting: *Deleted

## 2019-02-04 MED ORDER — LANSOPRAZOLE 30 MG PO CPDR: 30.0000 mg | DELAYED_RELEASE_CAPSULE | Freq: Every day | ORAL | 1 refills | Status: DC

## 2019-02-22 MED FILL — LOSARTAN POTASSIUM 50 MG TA: 50 | 90 days supply | Qty: 90 | Fill #0 | Status: TO

## 2019-02-22 MED FILL — LOSARTAN POTASSIUM 50 MG TA: 50 | 90 days supply | Qty: 90 | Fill #0

## 2019-04-05 ENCOUNTER — Other Ambulatory Visit: Payer: Self-pay | Admitting: Adult Health

## 2019-04-05 MED ORDER — OMEPRAZOLE 40 MG PO CPDR: 40.0000 mg | DELAYED_RELEASE_CAPSULE | Freq: Every day | ORAL | 1 refills | Status: DC

## 2019-04-05 MED FILL — OMEPRAZOLE 40 MG CPDR: 40 | 90 days supply | Qty: 90 | Fill #0

## 2019-05-17 MED FILL — LOSARTAN POTASSIUM 50 MG TA: 50 | 30 days supply | Qty: 30 | Fill #4

## 2019-05-27 DIAGNOSIS — E785 Hyperlipidemia, unspecified: Secondary | ICD-10-CM | POA: Insufficient documentation

## 2019-05-27 NOTE — Progress Notes (Signed)
FOLLOW UP  Assessment and Plan:   Hypertension Well controlled with current medications  Monitor blood pressure at home; patient to call if consistently greater than 130/80 Continue DASH diet.   Reminder to go to the ER if any CP, SOB, nausea, dizziness, severe HA, changes vision/speech, left arm numbness and tingling and jaw pain.  Cholesterol Currently with mild elevations managed by lifestyle Continue low cholesterol diet and exercise.  Defer lipids to CPE  Other abnormal glucose Recent A1Cs at goal; hx of prediabetes Continue diet and exercise.  Perform daily foot/skin check, notify office of any concerning changes.  Check A1C annually, check CMP/serum glucose   Morbid Obesity with co morbidities Long discussion about weight loss, diet, and exercise Recommended diet heavy in fruits and veggies and low in animal meats, cheeses, and dairy products, appropriate calorie intake Discussed ideal weight for height and initial weight goal (230 lb) He has stopped eating out, emphasized portion control Will follow up in 3 months  Vitamin D Def At goal at last visit; continue supplementation to maintain goal of 60-100 Defer Vit D level  Ankle tendonitis/mid back pain Prednisone was prescribed,NSAIDs, RICE, and exercise given If not better follow up in office or will refer to PT/orthopedics. Proper lifting, bending technique discussed. Stretching exercises discussed. Regular aerobic and trunk strengthening exercises discussed.   Continue diet and meds as discussed. Further disposition pending results of labs. Discussed med's effects and SE's.   Over 30 minutes of exam, counseling, chart review, and critical decision making was performed.   Future Appointments  Date Time Provider [place]  11/25/2019  3:00 PM Liane Comber, NP [facility] None     ----------------------------------------------------------------------------------------------------------------------  HPI 59 y.o. male  presents for 3 month follow up on hypertension, cholesterol, prediabetes, morbid obesity, GERD and vitamin D deficiency.   He reports bilateral ankle pain, saw podiatry late last year, never sent in script, ? Was discussing steroid taper. Also reports some intermittent mid R sided back pain. Describes as shooting, "zinging" pain. Hasn't tried anything for this.  Would like to try steroid taper.   he has a diagnosis of GERD which is currently managed by omeprazole 40 mg daily in AM; failed attempt to taper with famotidine, takes PRN.  he reports symptoms is currently well controlled, and denies breakthrough reflux, burning in chest, hoarseness or cough.    BMI is Body mass index is 36.33 kg/m., he has not been working on diet and exercise- previously a Air[employer] pilot, has been working out doing light dumbbells, resistance bands, body weight exercises at home. He does work a physically active job. Discussed risks/benefits and barriers at length. He drinks water and Gatorade. He is eating out less due to covid 19, taking food from home (fruit, nuts, cereal in AM, eggs and wheat white bread, chicken/turkery sandwhiches for lunch, protein and veggies for dinner, or salads, almost no red meat). Wt Readings from Last 3 Encounters:  05/31/19 246 lb (111.6 kg)  11/25/18 251 lb (113.9 kg)  05/27/18 245 lb (111.1 kg)   His blood pressure has been controlled at home, today their BP is BP: 126/72   He does workout. He denies chest pain, shortness of breath, dizziness.   He is not on cholesterol medication and denies myalgias. His cholesterol is not at goal. The cholesterol last visit was:   Lab Results  Component Value Date   CHOL 172 11/25/2018   HDL 50 11/25/2018   LDLCALC 105 (H) 11/25/2018   TRIG 81  11/25/2018   CHOLHDL 3.4 11/25/2018    He has not been  working on diet and exercise for hx of prediabetes (5.8% 05/2018, 6.0% 2016), and denies foot ulcerations, hyperglycemia, increased appetite, nausea, paresthesia of the feet, polydipsia, polyuria, visual disturbances, vomiting and weight loss. Last A1C in the office was:  Lab Results  Component Value Date   HGBA1C 5.6 11/25/2018    Last GFR:  Lab Results  Component Value Date   GFRNONAA 75 11/25/2018   Patient is on Vitamin D supplement and at goal:    Lab Results  Component Value Date   VD25OH 64 11/25/2018        Current Medications:  Current Outpatient Medications on File Prior to Visit  Medication Sig  . AMINO ACIDS PO Take by mouth daily.  . Cholecalciferol 5000 units capsule Take 5,000 Units by mouth daily.  . famotidine (PEPCID) 20 MG tablet Take 1 tablet (20 mg total) by mouth 2 (two) times daily.  . fexofenadine (ALLEGRA) 180 MG tablet Take 180 mg by mouth daily.   . hydrocortisone-pramoxine (ANALPRAM-HC) 2.5-1 % rectal cream Place rectally 3 (three) times daily as needed for hemorrhoids or anal itching.  . losartan (COZAAR) 50 MG tablet TAKE 1 TABLET(50 MG) BY MOUTH DAILY  . omeprazole (PRILOSEC) 40 MG capsule Take 1 capsule (40 mg total) by mouth daily.  . Probiotic Product (PROBIOTIC DAILY PO) Take by mouth daily.  . Zinc 50 MG CAPS Take by mouth daily.   No current facility-administered medications on file prior to visit.     Allergies:  Allergies  Allergen Reactions  . Iodides Hives     Medical History:  Past Medical History:  Diagnosis Date  . Allergic rhinitis   . Anemia   . Chest pain   . GERD (gastroesophageal reflux disease)   . GERD (gastroesophageal reflux disease) 08/01/2013  . Hypertension   . Lung nodule, solitary 11/11/2014   11/11/2014 nodule noted; follow up CT done 11/28/2015- nodule stable, no follow up recommended.   . Obesity   . Tendonitis   . Vitamin B12 deficiency    Family history- Reviewed and unchanged Social history- Reviewed  and unchanged   Review of Systems:  Review of Systems  Constitutional: Negative for malaise/fatigue and weight loss.  HENT: Negative for hearing loss and tinnitus.   Eyes: Negative for blurred vision and double vision.  Respiratory: Negative for cough, shortness of breath and wheezing.   Cardiovascular: Negative for chest pain, palpitations, orthopnea, claudication and leg swelling.  Gastrointestinal: Negative for abdominal pain, blood in stool, constipation, diarrhea, heartburn, melena, nausea and vomiting.  Genitourinary: Negative.   Musculoskeletal: Positive for back pain (mid upper R sided intermittent). Negative for joint pain and myalgias.  Skin: Negative for rash.  Neurological: Negative for dizziness, tingling, sensory change, weakness and headaches.  Endo/Heme/Allergies: Negative for polydipsia.  Psychiatric/Behavioral: Negative.   All other systems reviewed and are negative.    Physical Exam: BP 126/72   Pulse 82   Temp (!) 97.5 F (36.4 C)   Ht 5\' 9"  (1.753 m)   Wt 246 lb (111.6 kg)   SpO2 96%   BMI 36.33 kg/m  Wt Readings from Last 3 Encounters:  05/31/19 246 lb (111.6 kg)  11/25/18 251 lb (113.9 kg)  05/27/18 245 lb (111.1 kg)   General Appearance: Well nourished, obese, in no apparent distress. Eyes: PERRLA, EOMs, conjunctiva no swelling or erythema Sinuses: No Frontal/maxillary tenderness ENT/Mouth: Ext aud canals clear, TMs without erythema,  bulging. No erythema, swelling, or exudate on post pharynx.  Tonsils not swollen or erythematous. Hearing normal.  Neck: Supple, thyroid normal.  Respiratory: Respiratory effort normal, BS equal bilaterally without rales, rhonchi, wheezing or stridor.  Cardio: RRR with no MRGs. Brisk peripheral pulses without edema.  Abdomen: Soft, + BS.  Non tender, no guarding, rebound, hernias, masses. Lymphatics: Non tender without lymphadenopathy.  Musculoskeletal: Full ROM, 5/5 strength, Normal gait. Neg straight leg raise. No  spinous tenderness. Some R mid back paraspinal tenderness.  Skin: Warm, dry without rashes, lesions, ecchymosis.  Neuro: Cranial nerves intact. No cerebellar symptoms.  Psych: Awake and oriented X 3, normal affect, Insight and Judgment appropriate.    Izora Ribas, NP 4:49 PM [facility]

## 2019-05-31 ENCOUNTER — Encounter: Payer: Self-pay | Admitting: Adult Health

## 2019-05-31 ENCOUNTER — Ambulatory Visit (INDEPENDENT_AMBULATORY_CARE_PROVIDER_SITE_OTHER): Payer: 59 | Admitting: Adult Health

## 2019-05-31 ENCOUNTER — Other Ambulatory Visit: Payer: Self-pay

## 2019-05-31 VITALS — BP 126/72 | HR 82 | Temp 97.5°F | Ht 69.0 in | Wt 246.0 lb

## 2019-05-31 DIAGNOSIS — K760 Fatty (change of) liver, not elsewhere classified: Secondary | ICD-10-CM | POA: Diagnosis not present

## 2019-05-31 DIAGNOSIS — R7309 Other abnormal glucose: Secondary | ICD-10-CM | POA: Diagnosis not present

## 2019-05-31 DIAGNOSIS — I10 Essential (primary) hypertension: Secondary | ICD-10-CM | POA: Diagnosis not present

## 2019-05-31 DIAGNOSIS — E785 Hyperlipidemia, unspecified: Secondary | ICD-10-CM

## 2019-05-31 DIAGNOSIS — K219 Gastro-esophageal reflux disease without esophagitis: Secondary | ICD-10-CM

## 2019-05-31 MED ORDER — PREDNISONE 20 MG PO TABS: ORAL_TABLET | ORAL | 0 refills | Status: AC

## 2019-05-31 MED FILL — predniSONE 20 MG TABS: 20 | 11 days supply | Qty: 20 | Fill #0

## 2019-05-31 NOTE — Patient Instructions (Signed)
Goals    . Blood Pressure < 130/80    . HEMOGLOBIN A1C < 5.7    . LDL CALC < 100    . Weight (lb) < 215 lb (97.5 kg)        Thoracic Strain Rehab Ask your health care provider which exercises are safe for you. Do exercises exactly as told by your health care provider and adjust them as directed. It is normal to feel mild stretching, pulling, tightness, or discomfort as you do these exercises. Stop right away if you feel sudden pain or your pain gets worse. Do not begin these exercises until told by your health care provider. Stretching and range-of-motion exercise This exercise warms up your muscles and joints and improves the movement and flexibility of your back and shoulders. This exercise also helps to relieve pain. Chest and spine stretch  1. Lie down on your back on a firm surface. 2. Roll a towel or a small blanket so it is about 4 inches (10 cm) in diameter. 3. Put the towel lengthwise under the middle of your back so it is under your spine, but not under your shoulder blades. 4. Put your hands behind your head and let your elbows fall to your sides. This will increase your stretch. 5. Take a deep breath (inhale). 6. Hold for __________ seconds. 7. Relax after you breathe out (exhale). Repeat __________ times. Complete this exercise __________ times a day. Strengthening exercises These exercises build strength and endurance in your back and your shoulder blade muscles. Endurance is the ability to use your muscles for a long time, even after they get tired. Alternating arm and leg raises  1. Get on your hands and knees on a firm surface. If you are on a hard floor, you may want to use padding, such as an exercise mat, to cushion your knees. 2. Line up your arms and legs. Your hands should be directly below your shoulders, and your knees should be directly below your hips. 3. Lift your left leg behind you. At the same time, raise your right arm and straighten it in front of  you. ? Do not lift your leg higher than your hip. ? Do not lift your arm higher than your shoulder. ? Keep your abdominal and back muscles tight. ? Keep your hips facing the ground. ? Do not arch your back. ? Keep your balance carefully, and do not hold your breath. 4. Hold for __________ seconds. 5. Slowly return to the starting position and repeat with your right leg and your left arm. Repeat __________ times. Complete this exercise __________ times a day. Straight arm rows This exercise is also called shoulder extension exercise. 1. Stand with your feet shoulder width apart. 2. Secure an exercise band to a stable object in front of you so the band is at or above shoulder height. 3. Hold one end of the exercise band in each hand. 4. Straighten your elbows and lift your hands up to shoulder height. 5. Step back, away from the secured end of the exercise band, until the band stretches. 6. Squeeze your shoulder blades together and pull your hands down to the sides of your thighs. Stop when your hands are straight down by your sides. This is shoulder extension. Do not let your hands go behind your body. 7. Hold for __________ seconds. 8. Slowly return to the starting position. Repeat __________ times. Complete this exercise __________ times a day. Prone shoulder external rotation 1. Lie on your  abdomen on a firm bed so your left / right forearm hangs over the edge of the bed and your upper arm is on the bed, straight out from your body. This is the prone position. ? Your elbow should be bent. ? Your palm should be facing your feet. 2. If instructed, hold a __________ weight in your hand. 3. Squeeze your shoulder blade toward the middle of your back. Do not let your shoulder lift toward your ear. 4. Keep your elbow bent in a 90-degree angle (right angle) while you slowly move your forearm up toward the ceiling. Move your forearm up to the height of the bed, toward your head. This is external  rotation. ? Your upper arm should not move. ? At the top of the movement, your palm should face the floor. 5. Hold for __________ seconds. 6. Slowly return to the starting position and relax your muscles. Repeat __________ times. Complete this exercise __________ times a day. Rowing scapular retraction This is an exercise in which the shoulder blades (scapulae) are pulled toward each other (retraction). 1. Sit in a stable chair without armrests, or stand up. 2. Secure an exercise band to a stable object in front of you so the band is at shoulder height. 3. Hold one end of the exercise band in each hand. Your palms should face down. 4. Bring your arms out straight in front of you. 5. Step back, away from the secured end of the exercise band, until the band stretches. 6. Pull the band backward. As you do this, bend your elbows and squeeze your shoulder blades together, but avoid letting the rest of your body move. Do not shrug your shoulders upward while you do this. 7. Stop when your elbows are at your sides or slightly behind your body. 8. Hold for __________ seconds. 9. Slowly straighten your arms to return to the starting position. Repeat __________ times. Complete this exercise __________ times a day. Posture and body mechanics Good posture and healthy body mechanics can help to relieve stress in your body's tissues and joints. Body mechanics refers to the movements and positions of your body while you do your daily activities. Posture is part of body mechanics. Good posture means:  Your spine is in its natural S-curve position (neutral).  Your shoulders are pulled back slightly.  Your head is not tipped forward. Follow these guidelines to improve your posture and body mechanics in your everyday activities. Standing   When standing, keep your spine neutral and your feet about hip width apart. Keep a slight bend in your knees. Your ears, shoulders, and hips should line up with each  other.  When you do a task in which you lean forward while standing in one place for a long time, place one foot up on a stable object that is 2-4 inches (5-10 cm) high, such as a footstool. This helps keep your spine neutral. Sitting   When sitting, keep your spine neutral and keep your feet flat on the floor. Use a footrest, if necessary, and keep your thighs parallel to the floor. Avoid rounding your shoulders, and avoid tilting your head forward.  When working at a desk or a computer, keep your desk at a height where your hands are slightly lower than your elbows. Slide your chair under your desk so you are close enough to maintain good posture.  When working at a computer, place your monitor at a height where you are looking straight ahead and you do not  have to tilt your head forward or downward to look at the screen. Resting When lying down and resting, avoid positions that are most painful for you.  If you have pain with activities such as sitting, bending, stooping, or squatting (flexion-basedactivities), lie in a position in which your body does not bend very much. For example, avoid curling up on your side with your arms and knees near your chest (fetal position).  If you have pain with activities such as standing for a long time or reaching with your arms (extension-basedactivities), lie with your spine in a neutral position and bend your knees slightly. Try the following positions: ? Lie on your side with a pillow between your knees. ? Lie on your back with a pillow under your knees.  Lifting   When lifting objects, keep your feet at least shoulder width apart and tighten your abdominal muscles.  Bend your knees and hips and keep your spine neutral. It is important to lift using the strength of your legs, not your back. Do not lock your knees straight out.  Always ask for help to lift heavy or awkward objects. This information is not intended to replace advice given to you by  your health care provider. Make sure you discuss any questions you have with your health care provider. Document Revised: 05/29/2018 Document Reviewed: 03/16/2018 Else[employer] Patient Education  [place].

## 2019-06-01 LAB — COMPLETE METABOLIC PANEL WITH GFR
AG Ratio: 2.4 (calc) (ref 1.0–2.5)
ALT: 26 U/L (ref 9–46)
AST: 26 U/L (ref 10–35)
Albumin: 4.4 g/dL (ref 3.6–5.1)
Alkaline phosphatase (APISO): 57 U/L (ref 35–144)
BUN: 15 mg/dL (ref 7–25)
CO2: 26 mmol/L (ref 20–32)
Calcium: 9.5 mg/dL (ref 8.6–10.3)
Chloride: 108 mmol/L (ref 98–110)
Creat: 0.99 mg/dL (ref 0.70–1.33)
GFR, Est African American: 98 mL/min/{1.73_m2} (ref >=60)
GFR, Est Non African American: 85 mL/min/{1.73_m2} (ref >=60)
Globulin: 1.8 g/dL (calc) — ABNORMAL LOW (ref 1.9–3.7)
Glucose, Bld: 104 mg/dL — ABNORMAL HIGH (ref 65–99)
Potassium: 3.9 mmol/L (ref 3.5–5.3)
Sodium: 141 mmol/L (ref 135–146)
Total Bilirubin: 0.7 mg/dL (ref 0.2–1.2)
Total Protein: 6.2 g/dL (ref 6.1–8.1)

## 2019-06-01 LAB — CBC WITH DIFFERENTIAL/PLATELET
Absolute Monocytes: 672 cells/uL (ref 200–950)
Basophils Absolute: 32 cells/uL (ref 0–200)
Basophils Relative: 0.4 %
Eosinophils Absolute: 128 cells/uL (ref 15–500)
Eosinophils Relative: 1.6 %
HCT: 42 % (ref 38.5–50.0)
Hemoglobin: 14.3 g/dL (ref 13.2–17.1)
Lymphs Abs: 1696 cells/uL (ref 850–3900)
MCH: 29.9 pg (ref 27.0–33.0)
MCHC: 34 g/dL (ref 32.0–36.0)
MCV: 87.7 fL (ref 80.0–100.0)
MPV: 11.1 fL (ref 7.5–12.5)
Monocytes Relative: 8.4 %
Neutro Abs: 5472 cells/uL (ref 1500–7800)
Neutrophils Relative %: 68.4 %
Platelets: 237 10*3/uL (ref 140–400)
RBC: 4.79 10*6/uL (ref 4.20–5.80)
RDW: 12.8 % (ref 11.0–15.0)
Total Lymphocyte: 21.2 %
WBC: 8 10*3/uL (ref 3.8–10.8)

## 2019-06-01 LAB — LIPID PANEL
Cholesterol: 160 mg/dL (ref <200)
HDL: 48 mg/dL (ref >=40)
LDL Cholesterol (Calc): 84 mg/dL (calc)
Non-HDL Cholesterol (Calc): 112 mg/dL (calc) (ref <130)
Total CHOL/HDL Ratio: 3.3 (calc) (ref <5.0)
Triglycerides: 180 mg/dL — ABNORMAL HIGH (ref <150)

## 2019-06-01 LAB — MAGNESIUM: Magnesium: 1.9 mg/dL (ref 1.5–2.5)

## 2019-06-01 LAB — TSH: TSH: 1.7 mIU/L (ref 0.40–4.50)

## 2019-07-28 ENCOUNTER — Other Ambulatory Visit: Payer: Self-pay | Admitting: Adult Health

## 2019-09-28 ENCOUNTER — Other Ambulatory Visit: Payer: Self-pay | Admitting: Adult Health

## 2019-09-28 MED FILL — OMEPRAZOLE 40 MG CPDR: 40 | 90 days supply | Qty: 90 | Fill #0

## 2019-10-01 MED FILL — HYDROCORT-PRAMOXINE (PERIAN: 2.5-1 | 15 days supply | Qty: 30 | Fill #1

## 2019-10-22 ENCOUNTER — Other Ambulatory Visit: Payer: Self-pay | Admitting: Internal Medicine

## 2019-10-22 MED ORDER — LOSARTAN POTASSIUM 50 MG PO TABS: ORAL_TABLET | ORAL | 0 refills | Status: DC

## 2019-10-23 ENCOUNTER — Other Ambulatory Visit: Payer: Self-pay | Admitting: Internal Medicine

## 2019-10-23 ENCOUNTER — Other Ambulatory Visit: Payer: Self-pay

## 2019-10-23 MED ORDER — LOSARTAN POTASSIUM 50 MG PO TABS: ORAL_TABLET | ORAL | 3 refills | Status: DC

## 2019-10-23 MED FILL — LOSARTAN POTASSIUM 50 MG TA: 50 | 90 days supply | Qty: 90 | Fill #0

## 2019-10-27 ENCOUNTER — Other Ambulatory Visit: Payer: Self-pay | Admitting: Internal Medicine

## 2019-10-27 MED ORDER — LOSARTAN POTASSIUM 50 MG PO TABS: ORAL_TABLET | ORAL | 0 refills | Status: DC

## 2019-10-28 ENCOUNTER — Other Ambulatory Visit: Payer: Self-pay | Admitting: Internal Medicine

## 2019-10-28 MED ORDER — LOSARTAN POTASSIUM 50 MG PO TABS: ORAL_TABLET | ORAL | 0 refills | Status: DC

## 2019-11-02 NOTE — Telephone Encounter (Signed)
HAS BEEN entered as requested

## 2019-11-24 NOTE — Progress Notes (Signed)
Complete Physical  Assessment and Plan:  Albert Griffin was seen today for annual exam.  Diagnoses and all orders for this visit:  Encounter for general adult medical examination with abnormal findings  Essential hypertension -     Continue medication: losartan 50 mg daily -     Improved at recheck  -     Check BP at home, call if over 130/80 -     Magnesium       -     EKG 12-Lead  Allergic rhinitis, unspecified seasonality, unspecified trigger       -      Avoid triggers, OTC allergy medication PRN  Gastroesophageal reflux disease without esophagitis -     Continue PPI/H2 inhibitor -     Avoid triggers, advised to lose weight -      Magnesium  Fatty liver -     Discussed reducing alcohol intake, avoid tylenol use, weight loss and low carb diet recommended -     CMP/GFR  DDD (degenerative disc disease), lumbar      -      Stable, declines PT/ortho referral at this time  Morbid Obesity Long discussion about weight loss, diet, and exercise Recommended diet heavy in fruits and veggies and low in animal meats, cheeses, and dairy products, appropriate calorie intake Patient will work on adding exercise, portion control Discussed appropriate weight for height (below 170 lb) and initial goal (<220 lb) Follow up at next visit -     TSH -     Hemoglobin A1c -     Discussed diet and exercise, need for weight loss. Pt reports motivated to work on this after today's visit Will start the patient on phentermine- hand out given and AE's discussed, will do close follow up. Return in 4 weeks.   Other abnormal glucose -     Currently at goal with lifestyle changes -     TSH -     Hemoglobin A1c -     UA with microscopic  Vitamin D deficiency/ osteoporosis prevention -     VITAMIN D 25 Hydroxy (Vit-D Deficiency, Fractures)  Prostate cancer screening -     PSA  Discussed med's effects and SE's. Screening labs and tests as requested with regular follow-up as recommended. Over 40 minutes of  exam, counseling, chart review and critical decision making was performed  Future Appointments  Date Time Provider [place]  11/29/2020  2:00 PM Unk Pinto, MD [facility] None     HPI [name] presents for a complete physical. He has HTN (hypertension); GERD (gastroesophageal reflux disease); Allergic rhinitis; Fatty liver; Morbid obesity ([place]); DDD (degenerative disc disease), lumbar; Other abnormal glucose (prediabetes); and Hyperlipidemia on their problem list.   Married, 2 grown kids, no grand kids.  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   He has mid back pain, chronic, improved, working on strengthening exercises, doing resistance bands which helps. Takes ibuprofen rarely. Also bil knee pain that limits squats.   He has intermittent ongoing tingling to L greater toe (onset after a foot procedure at podiatry) for which he has denied interventions.   he has a diagnosis of GERD which is currently controlled by omeprazole 40 mg daily; failed multiple attempts to taper off PPI.   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 He knows he needs to work  on diet; drank a lot of beer this summer, eating a lot of fast food. He is making some changes, eating sandwich instead of takeout at lunch. He would like to get down to 200 lb eventually.  Wt Readings from Last 3 Encounters:  11/25/19 249 lb (112.9 kg)  05/31/19 246 lb (111.6 kg)  11/25/18 251 lb (113.9 kg)   His blood pressure has been controlled at home, today their BP is BP: 122/74 He does not workout but works a physically intense job. He denies chest pain, shortness of breath, dizziness.   He is not on cholesterol medication and denies myalgias. His cholesterol is at goal. The cholesterol last visit was:   Lab Results  Component Value Date   CHOL 160 05/31/2019   HDL 48  05/31/2019   LDLCALC 84 05/31/2019   TRIG 180 (H) 05/31/2019   CHOLHDL 3.3 05/31/2019   He has been working on diet for glucose management, he is not on bASA, he is on ACE/ARB and denies nausea, polydipsia, polyuria, visual disturbances, vomiting and weight loss. Last A1C in the office was at goal:  Lab Results  Component Value Date   HGBA1C 5.6 11/25/2018   Last GFR: Lab Results  Component Value Date   GFRNONAA 85 05/31/2019   Patient is on Vitamin D supplement:    Lab Results  Component Value Date   VD25OH 64 11/25/2018     Last PSA was: Lab Results  Component Value Date   PSA 1.5 11/25/2018      Current Medications:  Current Outpatient Medications on File Prior to Visit  Medication Sig Dispense Refill  . AMINO ACIDS PO Take by mouth daily.    . Cholecalciferol 5000 units capsule Take 5,000 Units by mouth daily.    . fexofenadine (ALLEGRA) 180 MG tablet Take 180 mg by mouth daily.     . hydrocortisone-pramoxine (ANALPRAM-HC) 2.5-1 % rectal cream Place rectally 3 (three) times daily as needed for hemorrhoids or anal itching. 30 g 2  . losartan (COZAAR) 50 MG tablet Take 1 tablet     Daily     for BP 90 tablet 0  . omeprazole (PRILOSEC) 40 MG capsule Take 1 capsule Daily to Prevent Heart burn & Indigestion 90 capsule 0  . Probiotic Product (PROBIOTIC DAILY PO) Take by mouth daily.    . Zinc 50 MG CAPS Take by mouth daily.    . famotidine (PEPCID) 20 MG tablet Take 1 tablet (20 mg total) by mouth 2 (two) times daily. (Patient not taking: Reported on 11/25/2019) 180 tablet 1   No current facility-administered medications on file prior to visit.   Allergies:  Allergies  Allergen Reactions  . Iodides Hives   Health Maintenance:  Immunization History  Administered Date(s) Administered  . PFIZER SARS-COV-2 Vaccination 10/06/2019, 10/27/2019  . Pneumococcal-Unspecified 02/19/1995  . Tdap 10/07/2012    Tetanus: 2014 Pneumovax: 1997 Prevnar 13: due age 56 Flu vaccine:  declines Shingrix: declines  Covid 19: 2/2, 2021, pfizer  DEXA:  Colonoscopy:06/2011 Stress test 10/2013, normal CT cardiac score 10/2013 normal Echo 2013 CT chest: 11/11/2014 nodule noted; follow up CT done 11/28/2015- nodule stable, no follow up recommended.   Eye: Dr. Herbert Deaner, 02/2018, glasses Dental: [facility], last visit 2019, needs to schedule Dermatology: Dr. Renda Rolls, 09/2018  Patient Care Team: Unk Pinto, MD as PCP - General (Internal Medicine) Sable Feil, MD as Consulting Physician (Gastroenterology) Marica Otter, [place] (Optometry) Josue Hector, MD as Consulting Physician (Cardiology) Jovita Gamma,  MD as Consulting Physician (Neurosurgery) Jari Pigg, MD as Consulting Physician (Dermatology)  Medical History:  has HTN (hypertension); GERD (gastroesophageal reflux disease); Allergic rhinitis; Fatty liver; Morbid obesity ([place]); DDD (degenerative disc disease), lumbar; Other abnormal glucose (prediabetes); and Hyperlipidemia on their problem list. Surgical History:  He  has a past surgical history that includes Back surgery; Bunionectomy (Left, 02/14/2014); and Cervical disc surgery (2003). Family History:  His family history includes Brain cancer in his paternal uncle; Breast cancer in his mother; COPD in his sister; Cancer in his maternal grandfather; Dementia in his mother; Esophageal cancer in his father; Heart disease in his maternal uncle and sister; Lung cancer in his mother; Melanoma in his sister; Ovarian cancer in his sister. Social History:   reports that he has never smoked. He quit smokeless tobacco use about 19 years ago.  His smokeless tobacco use included chew. He reports current alcohol use. He reports that he does not use drugs.   Review of Systems:  Review of Systems  Constitutional: Negative for chills, fever and malaise/fatigue.  HENT: Negative for congestion, hearing loss (Left ear with reduced hearing consistent with baseline)  and tinnitus.   Eyes: Negative for blurred vision and double vision.  Respiratory: Negative for cough, hemoptysis, sputum production, shortness of breath and wheezing.   Cardiovascular: Negative for chest pain, palpitations, orthopnea, claudication and leg swelling.  Gastrointestinal: Negative for abdominal pain, blood in stool, constipation, diarrhea, heartburn, melena, nausea and vomiting.  Genitourinary: Negative for flank pain, frequency, hematuria and urgency.  Musculoskeletal: Positive for back pain (Chronic stable low back pain ). Negative for falls, myalgias and neck pain.  Skin: Negative for rash.  Neurological: Positive for tingling (Intermittent, left great toe, started after foot procedure. Declines intervention at this time. ). Negative for dizziness, sensory change, speech change, focal weakness, weakness and headaches.  Endo/Heme/Allergies: Negative for environmental allergies and polydipsia. Does not bruise/bleed easily.  Psychiatric/Behavioral: Negative for depression, memory loss, substance abuse and suicidal ideas. The patient does not have insomnia.     Physical Exam: Estimated body mass index is 36.77 kg/m as calculated from the following:   Height as of this encounter: 5\' 9"  (1.753 m).   Weight as of this encounter: 249 lb (112.9 kg). BP 122/74   Pulse 77   Temp (!) 96.6 F (35.9 C)   Ht 5\' 9"  (1.753 m)   Wt 249 lb (112.9 kg)   SpO2 98%   BMI 36.77 kg/m  General Appearance: Well nourished, in no apparent distress.  Eyes: PERRLA, EOMs, conjunctiva no swelling or erythema.  Sinuses: No Frontal/maxillary tenderness  ENT/Mouth: Ext aud canals clear, normal light reflex with TMs without erythema, bulging. Good dentition. No erythema, swelling, or exudate on post pharynx. Tonsils not swollen or erythematous. Hearing decreased on left (reportedly consistent with baseline).  Neck: Supple, thyroid normal. No bruits  Respiratory: Respiratory effort normal, BS equal  bilaterally without rales, rhonchi, wheezing or stridor.  Cardio: RRR without murmurs, rubs or gallops. Brisk peripheral pulses without edema.  Chest: symmetric, with normal excursions and percussion.  Abdomen: Soft, obese, nontender, no guarding, rebound, masses, or organomegaly. No palpable hernias on exam.  Lymphatics: Non tender without lymphadenopathy.  Genitourinary: no concerns, declines Musculoskeletal: Full ROM all peripheral extremities, 5/5 strength, and normal gait.  Skin: Warm, dry without rashes, lesions, ecchymosis. Neuro: Cranial nerves intact, reflexes equal bilaterally. Normal muscle tone, no cerebellar symptoms. Sensation intact.  Psych: Awake and oriented X 3, normal affect, Insight and Judgment appropriate.  EKG: WNL no ST changes, RSR' V1, aVR   Albert Griffin 3:29 PM [facility]

## 2019-11-25 ENCOUNTER — Ambulatory Visit (INDEPENDENT_AMBULATORY_CARE_PROVIDER_SITE_OTHER): Payer: 59 | Admitting: Adult Health

## 2019-11-25 ENCOUNTER — Encounter: Payer: Self-pay | Admitting: Adult Health

## 2019-11-25 ENCOUNTER — Other Ambulatory Visit: Payer: Self-pay

## 2019-11-25 ENCOUNTER — Other Ambulatory Visit: Payer: Self-pay | Admitting: Adult Health

## 2019-11-25 VITALS — BP 122/74 | HR 77 | Temp 96.6°F | Ht 69.0 in | Wt 249.0 lb

## 2019-11-25 DIAGNOSIS — E559 Vitamin D deficiency, unspecified: Secondary | ICD-10-CM

## 2019-11-25 DIAGNOSIS — Z125 Encounter for screening for malignant neoplasm of prostate: Secondary | ICD-10-CM

## 2019-11-25 DIAGNOSIS — E785 Hyperlipidemia, unspecified: Secondary | ICD-10-CM

## 2019-11-25 DIAGNOSIS — Z1329 Encounter for screening for other suspected endocrine disorder: Secondary | ICD-10-CM

## 2019-11-25 DIAGNOSIS — Z1389 Encounter for screening for other disorder: Secondary | ICD-10-CM

## 2019-11-25 DIAGNOSIS — Z0001 Encounter for general adult medical examination with abnormal findings: Secondary | ICD-10-CM

## 2019-11-25 DIAGNOSIS — Z0000 Encounter for general adult medical examination without abnormal findings: Secondary | ICD-10-CM

## 2019-11-25 DIAGNOSIS — J309 Allergic rhinitis, unspecified: Secondary | ICD-10-CM

## 2019-11-25 DIAGNOSIS — M5136 Other intervertebral disc degeneration, lumbar region: Secondary | ICD-10-CM

## 2019-11-25 DIAGNOSIS — K760 Fatty (change of) liver, not elsewhere classified: Secondary | ICD-10-CM

## 2019-11-25 DIAGNOSIS — I10 Essential (primary) hypertension: Secondary | ICD-10-CM

## 2019-11-25 DIAGNOSIS — Z131 Encounter for screening for diabetes mellitus: Secondary | ICD-10-CM

## 2019-11-25 DIAGNOSIS — K219 Gastro-esophageal reflux disease without esophagitis: Secondary | ICD-10-CM

## 2019-11-25 DIAGNOSIS — R7309 Other abnormal glucose: Secondary | ICD-10-CM

## 2019-11-25 MED ORDER — PHENTERMINE HCL 37.5 MG PO TABS: ORAL_TABLET | ORAL | 0 refills | Status: DC

## 2019-11-25 MED FILL — PHENTERMINE 37.5 MG TABLET: 37.5 | 30 days supply | Qty: 30 | Fill #0

## 2019-11-25 NOTE — Patient Instructions (Addendum)
Albert Griffin , Thank you for taking time to come for your Annual Wellness Visit. I appreciate your ongoing commitment to your health goals. Please review the following plan we discussed and let me know if I can assist you in the future.   These are the goals we discussed: Goals    . Blood Pressure < 130/80    . HEMOGLOBIN A1C < 5.7    . LDL CALC < 100    . Weight (lb) < 215 lb (97.5 kg)       This is a list of the screening recommended for you and due dates:  Health Maintenance  Topic Date Due  . Flu Shot  Never done  . Colon Cancer Screening  07/09/2021  . Tetanus Vaccine  10/08/2022  . COVID-19 Vaccine  Completed  .  Hepatitis C: One time screening is recommended by Center for Disease Control  (CDC) for  adults born from [birth year] through 1963.   Completed  . HIV Screening  Completed     Ask insurance about shingrix - if they cover, can get at [facility] or [facility]    Know what a healthy weight is for you (roughly BMI <25) and aim to maintain this  Aim for 7+ servings of fruits and vegetables daily  65-80+ fluid ounces of water or unsweet tea for healthy kidneys  Limit to max 1 drink of alcohol per day; avoid smoking/tobacco  Limit animal fats in diet for cholesterol and heart health - choose grass fed whenever available  Avoid highly processed foods, and foods high in saturated/trans fats  Aim for low stress - take time to unwind and care for your mental health  Aim for 150 min of moderate intensity exercise weekly for heart health, and weights twice weekly for bone health  Aim for 7-9 hours of sleep daily     Consider miralax for stool softening - or  Check daily fiber intake and try increasing further in diet     High-Fiber Diet Fiber, also called dietary fiber, is a type of carbohydrate that is found in fruits, vegetables, whole grains, and beans. A high-fiber diet can have many health benefits. Your health care provider may recommend a high-fiber diet to  help:  Prevent constipation. Fiber can make your bowel movements more regular.  Lower your cholesterol.  Relieve the following conditions: ? Swelling of veins in the anus (hemorrhoids). ? Swelling and irritation (inflammation) of specific areas of the digestive tract (uncomplicated diverticulosis). ? A problem of the large intestine (colon) that sometimes causes pain and diarrhea (irritable bowel syndrome, IBS).  Prevent overeating as part of a weight-loss plan.  Prevent heart disease, type 2 diabetes, and certain cancers. What is my plan? The recommended daily fiber intake in grams (g) includes:  38 g for men age 79 or younger.  30 g for men over age 34.  23 g for women age 55 or younger.  21 g for women over age 70. You can get the recommended daily intake of dietary fiber by:  Eating a variety of fruits, vegetables, grains, and beans.  Taking a fiber supplement, if it is not possible to get enough fiber through your diet. What do I need to know about a high-fiber diet?  It is better to get fiber through food sources rather than from fiber supplements. There is not a lot of research about how effective supplements are.  Always check the fiber content on the nutrition facts label of any prepackaged food.  Look for foods that contain 5 g of fiber or more per serving.  Talk with a diet and nutrition specialist (dietitian) if you have questions about specific foods that are recommended or not recommended for your medical condition, especially if those foods are not listed below.  Gradually increase how much fiber you consume. If you increase your intake of dietary fiber too quickly, you may have bloating, cramping, or gas.  Drink plenty of water. Water helps you to digest fiber. What are tips for following this plan?  Eat a wide variety of high-fiber foods.  Make sure that half of the grains that you eat each day are whole grains.  Eat breads and cereals that are made with  whole-grain flour instead of refined flour or white flour.  Eat brown rice, bulgur wheat, or millet instead of white rice.  Start the day with a breakfast that is high in fiber, such as a cereal that contains 5 g of fiber or more per serving.  Use beans in place of meat in soups, salads, and pasta dishes.  Eat high-fiber snacks, such as berries, raw vegetables, nuts, and popcorn.  Choose whole fruits and vegetables instead of processed forms like juice or sauce. What foods can I eat?  Fruits Berries. Pears. Apples. Oranges. Avocado. Prunes and raisins. Dried figs. Vegetables Sweet potatoes. Spinach. Kale. Artichokes. Cabbage. Broccoli. Cauliflower. Green peas. Carrots. Squash. Grains Whole-grain breads. Multigrain cereal. Oats and oatmeal. Brown rice. Barley. Bulgur wheat. [place]. Quinoa. Bran muffins. Popcorn. Rye wafer crackers. Meats and other proteins Navy, kidney, and pinto beans. Soybeans. Split peas. Lentils. Nuts and seeds. Dairy Fiber-fortified yogurt. Beverages Fiber-fortified soy milk. Fiber-fortified orange juice. Other foods Fiber bars. The items listed above may not be a complete list of recommended foods and beverages. Contact a dietitian for more options. What foods are not recommended? Fruits Fruit juice. Cooked, strained fruit. Vegetables Fried potatoes. Canned vegetables. Well-cooked vegetables. Grains White bread. Pasta made with refined flour. White rice. Meats and other proteins Fatty cuts of meat. Fried chicken or fried fish. Dairy Milk. Yogurt. Cream cheese. Sour cream. Fats and oils Butters. Beverages Soft drinks. Other foods Cakes and pastries. The items listed above may not be a complete list of foods and beverages to avoid. Contact a dietitian for more information. Summary  Fiber is a type of carbohydrate. It is found in fruits, vegetables, whole grains, and beans.  There are many health benefits of eating a high-fiber diet, such as  preventing constipation, lowering blood cholesterol, helping with weight loss, and reducing your risk of heart disease, diabetes, and certain cancers.  Gradually increase your intake of fiber. Increasing too fast can result in cramping, bloating, and gas. Drink plenty of water while you increase your fiber.  The best sources of fiber include whole fruits and vegetables, whole grains, nuts, seeds, and beans. This information is not intended to replace advice given to you by your health care provider. Make sure you discuss any questions you have with your health care provider. Document Revised: 12/09/2016 Document Reviewed: 12/09/2016 Elsevier Patient Education  2020 [employer].

## 2019-11-26 LAB — CBC WITH DIFFERENTIAL/PLATELET
Absolute Monocytes: 593 cells/uL (ref 200–950)
Basophils Absolute: 54 cells/uL (ref 0–200)
Basophils Relative: 0.7 %
Eosinophils Absolute: 139 cells/uL (ref 15–500)
Eosinophils Relative: 1.8 %
HCT: 45.1 % (ref 38.5–50.0)
Hemoglobin: 15 g/dL (ref 13.2–17.1)
Lymphs Abs: 1617 cells/uL (ref 850–3900)
MCH: 29.6 pg (ref 27.0–33.0)
MCHC: 33.3 g/dL (ref 32.0–36.0)
MCV: 89.1 fL (ref 80.0–100.0)
MPV: 11.2 fL (ref 7.5–12.5)
Monocytes Relative: 7.7 %
Neutro Abs: 5298 cells/uL (ref 1500–7800)
Neutrophils Relative %: 68.8 %
Platelets: 236 10*3/uL (ref 140–400)
RBC: 5.06 10*6/uL (ref 4.20–5.80)
RDW: 12.5 % (ref 11.0–15.0)
Total Lymphocyte: 21 %
WBC: 7.7 10*3/uL (ref 3.8–10.8)

## 2019-11-26 LAB — URINALYSIS, ROUTINE W REFLEX MICROSCOPIC
Bilirubin Urine: NEGATIVE
Glucose, UA: NEGATIVE
Hgb urine dipstick: NEGATIVE
Ketones, ur: NEGATIVE
Leukocytes,Ua: NEGATIVE
Nitrite: NEGATIVE
Protein, ur: NEGATIVE
Specific Gravity, Urine: 1.016 (ref 1.001–1.03)
pH: 6 (ref 5.0–8.0)

## 2019-11-26 LAB — COMPLETE METABOLIC PANEL WITH GFR
AG Ratio: 2.5 (calc) (ref 1.0–2.5)
ALT: 27 U/L (ref 9–46)
AST: 25 U/L (ref 10–35)
Albumin: 4.7 g/dL (ref 3.6–5.1)
Alkaline phosphatase (APISO): 63 U/L (ref 35–144)
BUN: 14 mg/dL (ref 7–25)
CO2: 29 mmol/L (ref 20–32)
Calcium: 9.6 mg/dL (ref 8.6–10.3)
Chloride: 104 mmol/L (ref 98–110)
Creat: 1.1 mg/dL (ref 0.70–1.33)
GFR, Est African American: 86 mL/min/{1.73_m2} (ref >=60)
GFR, Est Non African American: 74 mL/min/{1.73_m2} (ref >=60)
Globulin: 1.9 g/dL (calc) (ref 1.9–3.7)
Glucose, Bld: 84 mg/dL (ref 65–99)
Potassium: 3.9 mmol/L (ref 3.5–5.3)
Sodium: 141 mmol/L (ref 135–146)
Total Bilirubin: 1 mg/dL (ref 0.2–1.2)
Total Protein: 6.6 g/dL (ref 6.1–8.1)

## 2019-11-26 LAB — MAGNESIUM: Magnesium: 2.1 mg/dL (ref 1.5–2.5)

## 2019-11-26 LAB — LIPID PANEL
Cholesterol: 183 mg/dL (ref <200)
HDL: 59 mg/dL (ref >=40)
LDL Cholesterol (Calc): 108 mg/dL (calc) — ABNORMAL HIGH
Non-HDL Cholesterol (Calc): 124 mg/dL (calc) (ref <130)
Total CHOL/HDL Ratio: 3.1 (calc) (ref <5.0)
Triglycerides: 75 mg/dL (ref <150)

## 2019-11-26 LAB — MICROALBUMIN / CREATININE URINE RATIO
Creatinine, Urine: 147 mg/dL (ref 20–320)
Microalb Creat Ratio: 3 mcg/mg creat (ref <30)
Microalb, Ur: 0.4 mg/dL

## 2019-11-26 LAB — HEMOGLOBIN A1C
Hgb A1c MFr Bld: 5.7 % of total Hgb — ABNORMAL HIGH (ref <5.7)
Mean Plasma Glucose: 117 (calc)
eAG (mmol/L): 6.5 (calc)

## 2019-11-26 LAB — TSH: TSH: 2.3 mIU/L (ref 0.40–4.50)

## 2019-11-26 LAB — PSA: PSA: 2.49 ng/mL (ref <=4.0)

## 2019-11-26 LAB — INSULIN, RANDOM: Insulin: 7.4 u[IU]/mL

## 2019-11-26 LAB — VITAMIN D 25 HYDROXY (VIT D DEFICIENCY, FRACTURES): Vit D, 25-Hydroxy: 51 ng/mL (ref 30–100)

## 2019-12-21 ENCOUNTER — Other Ambulatory Visit: Payer: Self-pay | Admitting: Internal Medicine

## 2019-12-21 ENCOUNTER — Other Ambulatory Visit: Payer: Self-pay | Admitting: Adult Health

## 2019-12-21 MED FILL — OMEPRAZOLE 40 MG CPDR: 40 | 90 days supply | Qty: 90 | Fill #0

## 2019-12-22 ENCOUNTER — Other Ambulatory Visit: Payer: Self-pay | Admitting: Internal Medicine

## 2019-12-22 DIAGNOSIS — K219 Gastro-esophageal reflux disease without esophagitis: Secondary | ICD-10-CM

## 2019-12-22 MED ORDER — OMEPRAZOLE 40 MG PO CPDR: DELAYED_RELEASE_CAPSULE | ORAL | 3 refills | Status: DC

## 2019-12-24 ENCOUNTER — Other Ambulatory Visit: Payer: Self-pay | Admitting: Adult Health

## 2019-12-24 ENCOUNTER — Other Ambulatory Visit: Payer: Self-pay

## 2019-12-24 DIAGNOSIS — K219 Gastro-esophageal reflux disease without esophagitis: Secondary | ICD-10-CM

## 2019-12-24 MED ORDER — OMEPRAZOLE 40 MG PO CPDR: DELAYED_RELEASE_CAPSULE | ORAL | 3 refills | Status: DC

## 2019-12-24 MED FILL — PHENTERMINE 37.5 MG TABLET: 37.5 | 30 days supply | Qty: 30 | Fill #0

## 2019-12-25 ENCOUNTER — Other Ambulatory Visit: Payer: Self-pay | Admitting: Internal Medicine

## 2019-12-25 DIAGNOSIS — K219 Gastro-esophageal reflux disease without esophagitis: Secondary | ICD-10-CM

## 2019-12-25 MED ORDER — OMEPRAZOLE 40 MG PO CPDR: DELAYED_RELEASE_CAPSULE | ORAL | 3 refills | Status: DC

## 2019-12-26 ENCOUNTER — Other Ambulatory Visit: Payer: Self-pay | Admitting: Internal Medicine

## 2019-12-26 DIAGNOSIS — K219 Gastro-esophageal reflux disease without esophagitis: Secondary | ICD-10-CM

## 2019-12-26 MED ORDER — OMEPRAZOLE 40 MG PO CPDR: DELAYED_RELEASE_CAPSULE | ORAL | 0 refills | Status: DC

## 2020-01-15 MED FILL — LOSARTAN POTASSIUM 50 MG TA: 50 | 90 days supply | Qty: 90 | Fill #0

## 2020-01-16 ENCOUNTER — Other Ambulatory Visit: Payer: Self-pay | Admitting: Internal Medicine

## 2020-01-16 DIAGNOSIS — I10 Essential (primary) hypertension: Secondary | ICD-10-CM

## 2020-01-16 MED ORDER — LOSARTAN POTASSIUM 50 MG PO TABS: ORAL_TABLET | ORAL | 0 refills | Status: DC

## 2020-03-14 ENCOUNTER — Other Ambulatory Visit: Payer: Self-pay | Admitting: Internal Medicine

## 2020-03-14 DIAGNOSIS — K219 Gastro-esophageal reflux disease without esophagitis: Secondary | ICD-10-CM

## 2020-03-14 MED ORDER — OMEPRAZOLE 40 MG PO CPDR: DELAYED_RELEASE_CAPSULE | ORAL | 0 refills | Status: DC

## 2020-03-14 MED FILL — OMEPRAZOLE 40 MG CPDR: 40 | 90 days supply | Qty: 90 | Fill #0

## 2020-04-08 MED FILL — LOSARTAN POTASSIUM 50 MG TA: 50 | 30 days supply | Qty: 30 | Fill #1

## 2020-04-28 DIAGNOSIS — E559 Vitamin D deficiency, unspecified: Secondary | ICD-10-CM | POA: Insufficient documentation

## 2020-04-28 NOTE — Progress Notes (Signed)
FOLLOW UP  Assessment and Plan:   Hypertension Fairly controlled with current medications  Monitor blood pressure at home; patient to call if consistently greater than 130/80 Continue DASH diet.   Reminder to go to the ER if any CP, SOB, nausea, dizziness, severe HA, changes vision/speech, left arm numbness and tingling and jaw pain.  Cholesterol Currently with mild elevations managed by lifestyle Continue low cholesterol diet and exercise.  Defer lipids to CPE  Other abnormal glucose Intermittent prediabetes Continue diet and exercise.  Perform daily foot/skin check, notify office of any concerning changes.  Check A1C q39m  Morbid Obesity with co morbidities Long discussion about weight loss, diet, and exercise Recommended diet heavy in fruits and veggies and low in animal meats, cheeses, and dairy products, appropriate calorie intake Discussed ideal weight for height and initial weight goal (230 lb) Doing well with lifestyle modification Will follow up in 3 months  Vitamin D Def At goal at last visit; continue supplementation to maintain goal of 60-100 Defer Vit D level  Continue diet and meds as discussed. Further disposition pending results of labs. Discussed med's effects and SE's.   Over 30 minutes of exam, counseling, chart review, and critical decision making was performed.   Future Appointments  Date Time Provider [place]  11/29/2020  2:00 PM Unk Pinto, MD [facility] None    ----------------------------------------------------------------------------------------------------------------------  HPI 60 y.o. male  presents for 3 month follow up on hypertension, cholesterol, prediabetes, morbid obesity, GERD and vitamin D deficiency.   he has a diagnosis of GERD which is currently managed by omeprazole 40 mg daily in AM; failed attempt to taper with famotidine.   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  He is eating out less due to covid 19, taking food from home (fruit, nuts, cereal in AM, eggs and wheat white bread, chicken/turkery sandwhiches for lunch, protein and veggies for dinner, or salads, almost no red meat). Tried phentermine, couldn't tolerate even low dose 1/4 tab with urinary retention and constipation.  He is however down 10 lb since last visit;  Wt Readings from Last 3 Encounters:  05/01/20 239 lb (108.4 kg)  11/25/19 249 lb (112.9 kg)  05/31/19 246 lb (111.6 kg)   His blood pressure has been controlled at home (120-130/70-80s), today their BP is BP: 132/88   He does workout. He denies chest pain, shortness of breath, dizziness.   He is not on cholesterol medication and denies myalgias. His cholesterol is not at goal. The cholesterol last visit was:   Lab Results  Component Value Date   CHOL 183 11/25/2019   HDL 59 11/25/2019   LDLCALC 108 (H) 11/25/2019   TRIG 75 11/25/2019   CHOLHDL 3.1 11/25/2019    He has not been working on diet and exercise for hx of prediabetes (5.8% 05/2018, 6.0% 2016), and denies foot ulcerations, hyperglycemia, increased appetite, nausea, paresthesia of the feet, polydipsia, polyuria, visual disturbances, vomiting and weight loss. Last A1C in the office was:  Lab Results  Component Value Date   HGBA1C 5.7 (H) 11/25/2019    Last GFR:  Lab Results  Component Value Date   GFRNONAA 74 11/25/2019   Patient is on Vitamin D supplement and at goal:    Lab Results  Component Value Date   VD25OH 51 11/25/2019        Current Medications:  Current Outpatient Medications on File Prior to Visit  Medication Sig  . AMINO ACIDS PO Take by mouth daily.  . Cholecalciferol 5000 units capsule Take 5,000 Units by mouth daily.  . fexofenadine (ALLEGRA) 180 MG tablet Take 180 mg by mouth daily.   . hydrocortisone-pramoxine (ANALPRAM-HC) 2.5-1 % rectal cream Place rectally 3  (three) times daily as needed for hemorrhoids or anal itching.  . losartan (COZAAR) 50 MG tablet Take      1 tablet      Daily      for BP  . omeprazole (PRILOSEC) 40 MG capsule Take  1 capsule  Daily  to prevent Indigestion & Heartburn  . Probiotic Product (PROBIOTIC DAILY PO) Take by mouth daily.  . Zinc 50 MG CAPS Take by mouth daily.   No current facility-administered medications on file prior to visit.     Allergies:  Allergies  Allergen Reactions  . Iodides Hives     Medical History:  Past Medical History:  Diagnosis Date  . Allergic rhinitis   . Anemia   . Chest pain   . GERD (gastroesophageal reflux disease)   . GERD (gastroesophageal reflux disease) 08/01/2013  . Hypertension   . Lung nodule, solitary 11/11/2014   11/11/2014 nodule noted; follow up CT done 11/28/2015- nodule stable, no follow up recommended.   . Obesity   . Tendonitis   . Vitamin B12 deficiency    Family history- Reviewed and unchanged Social history- Reviewed and unchanged   Review of Systems:  Review of Systems  Constitutional: Negative for malaise/fatigue and weight loss.  HENT: Negative for hearing loss and tinnitus.   Eyes: Negative for blurred vision and double vision.  Respiratory: Negative for cough, shortness of breath and wheezing.   Cardiovascular: Negative for chest pain, palpitations, orthopnea, claudication and leg swelling.  Gastrointestinal: Negative for abdominal pain, blood in stool, constipation, diarrhea, heartburn, melena, nausea and vomiting.  Genitourinary: Negative.   Musculoskeletal: Positive for back pain (mid upper R sided intermittent). Negative for joint pain and myalgias.  Skin: Negative for rash.  Neurological: Negative for dizziness, tingling, sensory change, weakness and headaches.  Endo/Heme/Allergies: Negative for polydipsia.  Psychiatric/Behavioral: Negative.   All other systems reviewed and are negative.    Physical Exam: BP 132/88   Pulse 87   Temp  (!) 97.3 F (36.3 C)   Wt 239 lb (108.4 kg)   SpO2 96%   BMI 35.29 kg/m  Wt Readings from Last 3 Encounters:  05/01/20 239 lb (108.4 kg)  11/25/19 249 lb (112.9 kg)  05/31/19 246 lb (111.6 kg)   General Appearance: Well nourished, obese, in no apparent distress. Eyes: PERRLA, EOMs, conjunctiva no swelling or erythema Sinuses: No Frontal/maxillary tenderness ENT/Mouth: Ext aud canals clear, TMs without erythema, bulging. No erythema, swelling, or exudate on post pharynx.  Tonsils not swollen or erythematous. Hearing normal.  Neck: Supple, thyroid normal.  Respiratory: Respiratory effort normal, BS equal bilaterally without rales, rhonchi, wheezing or stridor.  Cardio: RRR with no MRGs. Brisk peripheral pulses without edema.  Abdomen: Soft, + BS.  Non tender, no guarding, rebound, hernias, masses. Lymphatics: Non tender without lymphadenopathy.  Musculoskeletal: Full ROM, 5/5 strength, Normal gait. Neg straight leg raise. No spinous tenderness. R mid back mild spastic muscles Skin: Warm, dry without rashes, lesions, ecchymosis.  Neuro: Cranial nerves intact. No cerebellar symptoms.  Psych: Awake and oriented X 3, normal affect, Insight and Judgment appropriate.    Izora Ribas, NP 3:40 PM [facility]

## 2020-05-01 ENCOUNTER — Encounter: Payer: Self-pay | Admitting: Adult Health

## 2020-05-01 ENCOUNTER — Other Ambulatory Visit: Payer: Self-pay

## 2020-05-01 ENCOUNTER — Ambulatory Visit (INDEPENDENT_AMBULATORY_CARE_PROVIDER_SITE_OTHER): Payer: BC Managed Care – PPO | Admitting: Adult Health

## 2020-05-01 VITALS — BP 132/88 | HR 87 | Temp 97.3°F | Wt 239.0 lb

## 2020-05-01 DIAGNOSIS — E559 Vitamin D deficiency, unspecified: Secondary | ICD-10-CM

## 2020-05-01 DIAGNOSIS — K760 Fatty (change of) liver, not elsewhere classified: Secondary | ICD-10-CM

## 2020-05-01 DIAGNOSIS — Z79899 Other long term (current) drug therapy: Secondary | ICD-10-CM

## 2020-05-01 DIAGNOSIS — E785 Hyperlipidemia, unspecified: Secondary | ICD-10-CM | POA: Diagnosis not present

## 2020-05-01 DIAGNOSIS — I10 Essential (primary) hypertension: Secondary | ICD-10-CM | POA: Diagnosis not present

## 2020-05-01 DIAGNOSIS — K219 Gastro-esophageal reflux disease without esophagitis: Secondary | ICD-10-CM | POA: Diagnosis not present

## 2020-05-01 DIAGNOSIS — R7309 Other abnormal glucose: Secondary | ICD-10-CM | POA: Diagnosis not present

## 2020-05-01 NOTE — Patient Instructions (Signed)
Muscle Cramps and Spasms Muscle cramps and spasms are when muscles tighten by themselves. They usually get better within minutes. Muscle cramps are painful. They are usually stronger and last longer than muscle spasms. Muscle spasms may or may not be painful. They can last a few seconds or much longer. Cramps and spasms can affect any muscle, but they occur most often in the calf muscles of the leg. They are usually not caused by a serious problem. In many cases, the cause is not known. Some common causes include:  Doing more physical work or exercise than your body is ready for.  Using the muscles too much (overuse) by repeating certain movements too many times.  Staying in a certain position for a long time.  Playing a sport or doing an activity without preparing properly.  Using bad form or technique while playing a sport or doing an activity.  Not having enough water in your body (dehydration).  Injury.  Side effects of some medicines.  Low levels of the salts and minerals in your blood (electrolytes), such as low potassium or calcium. Follow these instructions at home: Managing pain and stiffness  Massage, stretch, and relax the muscle. Do this for many minutes at a time.  If told, put heat on tight or tense muscles as often as told by your doctor. Use the heat source that your doctor recommends, such as a moist heat pack or a heating pad. ? Place a towel between your skin and the heat source. ? Leave the heat on for 20-30 minutes. ? Remove the heat if your skin turns bright red. This is very important if you are not able to feel pain, heat, or cold. You may have a greater risk of getting burned.  If told, put ice on the affected area. This may help if you are sore or have pain after a cramp or spasm. ? Put ice in a plastic bag. ? Place a towel between your skin and the bag. ? Leave the ice on for 20 minutes, 2-3 times a day.  Try taking hot showers or baths to help relax  tight muscles.      Eating and drinking  Drink enough fluid to keep your pee (urine) pale yellow.  Eat a healthy diet to help ensure that your muscles work well. This should include: ? Fruits and vegetables. ? Lean protein. ? Whole grains. ? Low-fat or nonfat dairy products. General instructions  If you are having cramps often, avoid intense exercise for several days.  Take over-the-counter and prescription medicines only as told by your doctor.  Watch for any changes in your symptoms.  Keep all follow-up visits as told by your doctor. This is important. Contact a doctor if:  Your cramps or spasms get worse or happen more often.  Your cramps or spasms do not get better with time. Summary  Muscle cramps and spasms are when muscles tighten by themselves. They usually get better within minutes.  Cramps and spasms occur most often in the calf muscles of the leg.  Massage, stretch, and relax the muscle. This may help the cramp or spasm go away.  Drink enough fluid to keep your pee (urine) pale yellow. This information is not intended to replace advice given to you by your health care provider. Make sure you discuss any questions you have with your health care provider. Document Revised: 06/30/2017 Document Reviewed: 06/30/2017 Elsevier Patient Education  2021 [employer] Inc.  

## 2020-05-02 LAB — CBC WITH DIFFERENTIAL/PLATELET
Absolute Monocytes: 461 cells/uL (ref 200–950)
Basophils Absolute: 32 cells/uL (ref 0–200)
Basophils Relative: 0.5 %
Eosinophils Absolute: 90 cells/uL (ref 15–500)
Eosinophils Relative: 1.4 %
HCT: 45 % (ref 38.5–50.0)
Hemoglobin: 15.3 g/dL (ref 13.2–17.1)
Lymphs Abs: 1414 cells/uL (ref 850–3900)
MCH: 29.7 pg (ref 27.0–33.0)
MCHC: 34 g/dL (ref 32.0–36.0)
MCV: 87.4 fL (ref 80.0–100.0)
MPV: 11.3 fL (ref 7.5–12.5)
Monocytes Relative: 7.2 %
Neutro Abs: 4403 cells/uL (ref 1500–7800)
Neutrophils Relative %: 68.8 %
Platelets: 224 10*3/uL (ref 140–400)
RBC: 5.15 10*6/uL (ref 4.20–5.80)
RDW: 12.9 % (ref 11.0–15.0)
Total Lymphocyte: 22.1 %
WBC: 6.4 10*3/uL (ref 3.8–10.8)

## 2020-05-02 LAB — COMPLETE METABOLIC PANEL WITH GFR
AG Ratio: 2.1 (calc) (ref 1.0–2.5)
ALT: 18 U/L (ref 9–46)
AST: 16 U/L (ref 10–35)
Albumin: 4.4 g/dL (ref 3.6–5.1)
Alkaline phosphatase (APISO): 63 U/L (ref 35–144)
BUN: 11 mg/dL (ref 7–25)
CO2: 28 mmol/L (ref 20–32)
Calcium: 9.9 mg/dL (ref 8.6–10.3)
Chloride: 105 mmol/L (ref 98–110)
Creat: 0.99 mg/dL (ref 0.70–1.33)
GFR, Est African American: 98 mL/min/{1.73_m2} (ref >=60)
GFR, Est Non African American: 84 mL/min/{1.73_m2} (ref >=60)
Globulin: 2.1 g/dL (calc) (ref 1.9–3.7)
Glucose, Bld: 87 mg/dL (ref 65–99)
Potassium: 4.4 mmol/L (ref 3.5–5.3)
Sodium: 140 mmol/L (ref 135–146)
Total Bilirubin: 0.9 mg/dL (ref 0.2–1.2)
Total Protein: 6.5 g/dL (ref 6.1–8.1)

## 2020-05-02 LAB — TSH: TSH: 2.53 mIU/L (ref 0.40–4.50)

## 2020-05-02 LAB — HEMOGLOBIN A1C
Hgb A1c MFr Bld: 5.7 % of total Hgb — ABNORMAL HIGH (ref <5.7)
Mean Plasma Glucose: 117 mg/dL
eAG (mmol/L): 6.5 mmol/L

## 2020-05-02 LAB — LIPID PANEL
Cholesterol: 160 mg/dL (ref <200)
HDL: 58 mg/dL (ref >=40)
LDL Cholesterol (Calc): 88 mg/dL (calc)
Non-HDL Cholesterol (Calc): 102 mg/dL (calc) (ref <130)
Total CHOL/HDL Ratio: 2.8 (calc) (ref <5.0)
Triglycerides: 62 mg/dL (ref <150)

## 2020-05-02 LAB — MAGNESIUM: Magnesium: 2 mg/dL (ref 1.5–2.5)

## 2020-05-08 MED FILL — LOSARTAN POTASSIUM 50 MG TA: 50 | 30 days supply | Qty: 30 | Fill #2

## 2020-05-10 ENCOUNTER — Other Ambulatory Visit (HOSPITAL_BASED_OUTPATIENT_CLINIC_OR_DEPARTMENT_OTHER): Payer: Self-pay

## 2020-05-26 ENCOUNTER — Other Ambulatory Visit (HOSPITAL_COMMUNITY): Payer: Self-pay

## 2020-06-17 ENCOUNTER — Other Ambulatory Visit: Payer: Self-pay | Admitting: Internal Medicine

## 2020-06-17 MED ORDER — HYDROCORT-PRAMOXINE (PERIANAL) 2.5-1 % EX CREA
TOPICAL_CREAM | Freq: Three times a day (TID) | CUTANEOUS | 1 refills | Status: DC | PRN
  Filled 2020-06-17: qty 30, 30d supply, fill #0

## 2020-06-19 ENCOUNTER — Other Ambulatory Visit (HOSPITAL_COMMUNITY): Payer: Self-pay

## 2020-07-06 ENCOUNTER — Other Ambulatory Visit (HOSPITAL_COMMUNITY): Payer: Self-pay

## 2020-07-06 MED FILL — Losartan Potassium Tab 50 MG: ORAL | 90 days supply | Qty: 90 | Fill #0 | Status: CN

## 2020-07-06 MED FILL — Losartan Potassium Tab 50 MG: ORAL | 90 days supply | Qty: 90 | Fill #0 | Status: AC

## 2020-07-30 MED FILL — Omeprazole Cap Delayed Release 40 MG: ORAL | 90 days supply | Qty: 90 | Fill #0 | Status: AC

## 2020-07-31 ENCOUNTER — Other Ambulatory Visit (HOSPITAL_COMMUNITY): Payer: Self-pay

## 2020-10-10 ENCOUNTER — Other Ambulatory Visit (HOSPITAL_COMMUNITY): Payer: Self-pay

## 2020-10-10 MED FILL — Losartan Potassium Tab 50 MG: ORAL | 90 days supply | Qty: 90 | Fill #1 | Status: AC

## 2020-10-16 DIAGNOSIS — L814 Other melanin hyperpigmentation: Secondary | ICD-10-CM | POA: Diagnosis not present

## 2020-10-16 DIAGNOSIS — Z86018 Personal history of other benign neoplasm: Secondary | ICD-10-CM | POA: Diagnosis not present

## 2020-10-16 DIAGNOSIS — D225 Melanocytic nevi of trunk: Secondary | ICD-10-CM | POA: Diagnosis not present

## 2020-10-16 DIAGNOSIS — L821 Other seborrheic keratosis: Secondary | ICD-10-CM | POA: Diagnosis not present

## 2020-10-25 ENCOUNTER — Other Ambulatory Visit (HOSPITAL_COMMUNITY): Payer: Self-pay

## 2020-10-25 MED FILL — Omeprazole Cap Delayed Release 40 MG: ORAL | 90 days supply | Qty: 90 | Fill #1 | Status: AC

## 2020-11-29 ENCOUNTER — Encounter: Payer: Self-pay | Admitting: Internal Medicine

## 2020-11-29 ENCOUNTER — Ambulatory Visit (INDEPENDENT_AMBULATORY_CARE_PROVIDER_SITE_OTHER): Payer: BC Managed Care – PPO | Admitting: Internal Medicine

## 2020-11-29 ENCOUNTER — Other Ambulatory Visit: Payer: Self-pay

## 2020-11-29 VITALS — BP 124/80 | HR 79 | Temp 97.9°F | Resp 17 | Ht 69.0 in | Wt 250.2 lb

## 2020-11-29 DIAGNOSIS — Z130 Encounter for screening for diseases of the blood and blood-forming organs and certain disorders involving the immune mechanism: Secondary | ICD-10-CM | POA: Diagnosis not present

## 2020-11-29 DIAGNOSIS — Z1212 Encounter for screening for malignant neoplasm of rectum: Secondary | ICD-10-CM

## 2020-11-29 DIAGNOSIS — K219 Gastro-esophageal reflux disease without esophagitis: Secondary | ICD-10-CM

## 2020-11-29 DIAGNOSIS — Z6836 Body mass index (BMI) 36.0-36.9, adult: Secondary | ICD-10-CM

## 2020-11-29 DIAGNOSIS — R7309 Other abnormal glucose: Secondary | ICD-10-CM

## 2020-11-29 DIAGNOSIS — E782 Mixed hyperlipidemia: Secondary | ICD-10-CM

## 2020-11-29 DIAGNOSIS — Z131 Encounter for screening for diabetes mellitus: Secondary | ICD-10-CM

## 2020-11-29 DIAGNOSIS — E559 Vitamin D deficiency, unspecified: Secondary | ICD-10-CM | POA: Diagnosis not present

## 2020-11-29 DIAGNOSIS — I10 Essential (primary) hypertension: Secondary | ICD-10-CM | POA: Diagnosis not present

## 2020-11-29 DIAGNOSIS — Z136 Encounter for screening for cardiovascular disorders: Secondary | ICD-10-CM

## 2020-11-29 DIAGNOSIS — N401 Enlarged prostate with lower urinary tract symptoms: Secondary | ICD-10-CM | POA: Diagnosis not present

## 2020-11-29 DIAGNOSIS — Z111 Encounter for screening for respiratory tuberculosis: Secondary | ICD-10-CM | POA: Diagnosis not present

## 2020-11-29 DIAGNOSIS — Z0000 Encounter for general adult medical examination without abnormal findings: Secondary | ICD-10-CM

## 2020-11-29 DIAGNOSIS — Z8249 Family history of ischemic heart disease and other diseases of the circulatory system: Secondary | ICD-10-CM

## 2020-11-29 DIAGNOSIS — Z0001 Encounter for general adult medical examination with abnormal findings: Secondary | ICD-10-CM

## 2020-11-29 DIAGNOSIS — Z1389 Encounter for screening for other disorder: Secondary | ICD-10-CM

## 2020-11-29 DIAGNOSIS — R350 Frequency of micturition: Secondary | ICD-10-CM

## 2020-11-29 DIAGNOSIS — R5383 Other fatigue: Secondary | ICD-10-CM | POA: Insufficient documentation

## 2020-11-29 DIAGNOSIS — Z125 Encounter for screening for malignant neoplasm of prostate: Secondary | ICD-10-CM

## 2020-11-29 DIAGNOSIS — K760 Fatty (change of) liver, not elsewhere classified: Secondary | ICD-10-CM

## 2020-11-29 DIAGNOSIS — Z1211 Encounter for screening for malignant neoplasm of colon: Secondary | ICD-10-CM

## 2020-11-29 DIAGNOSIS — Z13220 Encounter for screening for lipoid disorders: Secondary | ICD-10-CM | POA: Diagnosis not present

## 2020-11-29 DIAGNOSIS — Z79899 Other long term (current) drug therapy: Secondary | ICD-10-CM

## 2020-11-29 DIAGNOSIS — E661 Drug-induced obesity: Secondary | ICD-10-CM

## 2020-11-29 NOTE — Patient Instructions (Signed)

## 2020-11-29 NOTE — Progress Notes (Signed)
Annual  Screening/Preventative Visit  & Comprehensive Evaluation & Examination  Future Appointments  Date Time Provider [place]  11/29/2020  2:00 PM Unk Pinto, MD [facility] None            This very nice 60 y.o. MWM presents for a Screening /Preventative Visit & comprehensive evaluation and management of multiple medical co-morbidities.  Patient has been followed for HTN, HLD, Prediabetes and Vitamin D Deficiency.       HTN predates since 1996. Patient's BP has been controlled at home.  Today's BP is at goal - 124/80. Patient denies any cardiac symptoms as chest pain, palpitations, shortness of breath, dizziness or ankle swelling.       Patient's hyperlipidemia is controlled with diet and medications. Patient denies myalgias or other medication SE's. Last lipids were  at goal:  Lab Results  Component Value Date   CHOL 160 05/01/2020   HDL 58 05/01/2020   LDLCALC 88 05/01/2020   TRIG 62 05/01/2020   CHOLHDL 2.8 05/01/2020         Patient has hx/o prediabetes since    and patient denies reactive hypoglycemic symptoms, visual blurring, diabetic polys or paresthesias. Last A1c was near goal:   Lab Results  Component Value Date   HGBA1C 5.7 (H) 05/01/2020          Finally, patient has history of Vitamin D Deficiency ("33" /2018) and last vitamin D was sl low  (goal 70-100):   Lab Results  Component Value Date   VD25OH 51 11/25/2019     Current Outpatient Medications on File Prior to Visit  Medication Sig   AMINO ACIDS  Take  daily.   Cholecalciferol 5000 units capsule Take  daily.   fexofenadine 180 MG tablet Take daily   ANALPRAM-HC) 2.5-1 % rectal cream Place rectally 3 times daily as needed    losartan 50 MG tablet TAKE 1 TABLET  DAILY    omeprazole  40 MG capsule TAKE 1 CAPSULE ONCE A DAY    Probiotic Product  Take daily.   Zinc 50 MG CAPS Take daily.     Allergies  [place] Reactions   Iodides Hives     Past Medical History:   Diagnosis Date   Allergic rhinitis    Anemia    Chest pain    GERD (gastroesophageal reflux disease)    GERD (gastroesophageal reflux disease) 08/01/2013   Hypertension    Lung nodule, solitary 11/11/2014   11/11/2014 nodule noted; follow up CT done 11/28/2015- nodule stable, no follow up recommended.    Obesity    Tendonitis    Vitamin B12 deficiency      Health Maintenance  Topic Date Due   Zoster Vaccines- Shingrix (1 of 2) Never done   COVID-19 Vaccine (3 - Pfizer risk series) 11/24/2019   INFLUENZA VACCINE  Never done   COLONOSCOPY  07/09/2021   TETANUS/TDAP  10/08/2022   Hepatitis C Screening  Completed   HIV Screening  Completed   HPV VACCINES  Aged Out     Immunization History  Administered Date(s) Administered   PFIZER SARS-COV-2 Vacc 10/06/2019, 10/27/2019   Pneumococcal-23 02/19/1995   Tdap 10/07/2012    Last Colon - 07/10/2011 - Dr Sharlett Iles - Recc 10 year f/u due May 2023  Past Surgical History:  Procedure Laterality Date   BACK SURGERY     x2, L4-L5   BUNIONECTOMY Left 02/14/2014   CERVICAL [place] SURGERY  2003     Family History  Problem  Relation Age of Onset   Lung cancer Mother    Breast cancer Mother    Dementia Mother    Esophageal cancer Father    Heart disease Sister    Melanoma Sister    Ovarian cancer Sister    COPD Sister    Heart disease Maternal Uncle    Brain cancer Paternal Uncle    Cancer Maternal Grandfather      Social History   Tobacco Use   Smoking status: Never   Smokeless tobacco: Former    Types: Chew    Quit date: 11/10/2000  Vaping Use   Vaping Use: Never used  Substance Use Topics   Alcohol use: Yes    Alcohol/week: 0.0 standard drinks    Comment: more during the summer and minimal after labor day   Drug use: No      ROS Constitutional: Denies fever, chills, weight loss/gain, headaches, insomnia,  night sweats or change in appetite. Does c/o fatigue. Eyes: Denies redness, blurred vision, diplopia,  discharge, itchy or watery eyes.  ENT: Denies discharge, congestion, post nasal drip, epistaxis, sore throat, earache, hearing loss, dental pain, Tinnitus, Vertigo, Sinus pain or snoring.  Cardio: Denies chest pain, palpitations, irregular heartbeat, syncope, dyspnea, diaphoresis, orthopnea, PND, claudication or edema Respiratory: denies cough, dyspnea, DOE, pleurisy, hoarseness, laryngitis or wheezing.  Gastrointestinal: Denies dysphagia, heartburn, reflux, water brash, pain, cramps, nausea, vomiting, bloating, diarrhea, constipation, hematemesis, melena, hematochezia, jaundice or hemorrhoids Genitourinary: Denies dysuria, frequency, urgency, nocturia, hesitancy, discharge, hematuria or flank pain Musculoskeletal: Denies arthralgia, myalgia, stiffness, Jt. Swelling, pain, limp or strain/sprain. Denies Falls. Skin: Denies puritis, rash, hives, warts, acne, eczema or change in skin lesion Neuro: No weakness, tremor, incoordination, spasms, paresthesia or pain Psychiatric: Denies confusion, memory loss or sensory loss. Denies Depression. Endocrine: Denies change in weight, skin, hair change, nocturia, and paresthesia, diabetic polys, visual blurring or hyper / hypo glycemic episodes.  Heme/Lymph: No excessive bleeding, bruising or enlarged lymph nodes.   Physical Exam  BP 124/80   Pulse 79   Temp 97.9 F (36.6 C)   Resp 17   Ht 5\' 9"  (1.753 m)   Wt 250 lb 3.2 oz (113.5 kg)   SpO2 96%   BMI 36.95 kg/m   General Appearance: Well nourished and well groomed and in no apparent distress.  Eyes: PERRLA, EOMs, conjunctiva no swelling or erythema, normal fundi and vessels. Sinuses: No frontal/maxillary tenderness ENT/Mouth: EACs patent / TMs  nl. Nares clear without erythema, swelling, mucoid exudates. Oral hygiene is good. No erythema, swelling, or exudate. Tongue normal, non-obstructing. Tonsils not swollen or erythematous. Hearing normal.  Neck: Supple, thyroid not palpable. No bruits, nodes  or JVD. Respiratory: Respiratory effort normal.  BS equal and clear bilateral without rales, rhonci, wheezing or stridor. Cardio: Heart sounds are normal with regular rate and rhythm and no murmurs, rubs or gallops. Peripheral pulses are normal and equal bilaterally without edema. No aortic or femoral bruits. Chest: symmetric with normal excursions and percussion.  Abdomen: Soft, with Nl bowel sounds. Nontender, no guarding, rebound, hernias, masses, or organomegaly.  Lymphatics: Non tender without lymphadenopathy.  Musculoskeletal: Full ROM all peripheral extremities, joint stability, 5/5 strength, and normal gait. Skin: Warm and dry without rashes, lesions, cyanosis, clubbing or  ecchymosis.  Neuro: Cranial nerves intact, reflexes equal bilaterally. Normal muscle tone, no cerebellar symptoms. Sensation intact.  Pysch: Alert and oriented X 3 with normal affect, insight and judgment appropriate.   Assessment and Plan  1. Annual Preventative/Screening Exam  2. Essential hypertension  - EKG 12-Lead - Urinalysis, Routine w reflex microscopic - Microalbumin / creatinine urine ratio - [place], RETROPERITNL ABD,  LTD - CBC with Differential/Platelet - COMPLETE METABOLIC PANEL WITH GFR - TSH  3. Hyperlipidemia, mixed  - Lipid panel  4. Vitamin D deficiency  - VITAMIN D 25 Hydroxy   5. Abnormal glucose  - Hemoglobin A1c - Insulin, random  6. 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 Gastroesophageal reflux disease without esophagitis  - CBC with Differential/Platelet  9. Screening for prostate cancer  - PSA  10. Screening-pulmonary TB  - TB Skin Test  11. Screening for colorectal cancer  - POC Hemoccult Bld/Stl   12. Screening for ischemic heart disease  - EKG 12-Lead  13. Family history of ischemic heart disease  - EKG 12-Lead - [place], RETROPERITNL ABD,   LTD  14. Screening for AAA (aortic abdominal aneurysm)  - [place], RETROPERITNL ABD,  LTD  15. Fatigue  - Iron, Total/Total Iron Binding Cap - Vitamin B12 - CBC with Differential/Platelet - TSH  16. Medication management  - Urinalysis, Routine w reflex microscopic - Microalbumin / creatinine urine ratio  17. Visit for TB skin test           Patient was counseled in prudent diet, weight control to achieve/maintain BMI less than 25, BP monitoring, regular exercise and medications as discussed.  Discussed med effects and SE's. Routine screening labs and tests as requested with regular follow-up as recommended. Over 40 minutes of exam, counseling, chart review and high complex critical decision making was performed   Kirtland Bouchard, MD

## 2020-11-30 LAB — IRON, TOTAL/TOTAL IRON BINDING CAP
%SAT: 41 % (calc) (ref 20–48)
Iron: 127 ug/dL (ref 50–180)
TIBC: 313 mcg/dL (calc) (ref 250–425)

## 2020-11-30 LAB — MICROALBUMIN / CREATININE URINE RATIO
Creatinine, Urine: 104 mg/dL (ref 20–320)
Microalb, Ur: <0.2 mg/dL

## 2020-11-30 LAB — CBC WITH DIFFERENTIAL/PLATELET
Absolute Monocytes: 435 cells/uL (ref 200–950)
Basophils Absolute: 39 cells/uL (ref 0–200)
Basophils Relative: 0.7 %
Eosinophils Absolute: 88 cells/uL (ref 15–500)
Eosinophils Relative: 1.6 %
HCT: 44.2 % (ref 38.5–50.0)
Hemoglobin: 15.2 g/dL (ref 13.2–17.1)
Lymphs Abs: 1348 cells/uL (ref 850–3900)
MCH: 30.8 pg (ref 27.0–33.0)
MCHC: 34.4 g/dL (ref 32.0–36.0)
MCV: 89.5 fL (ref 80.0–100.0)
MPV: 11.1 fL (ref 7.5–12.5)
Monocytes Relative: 7.9 %
Neutro Abs: 3592 cells/uL (ref 1500–7800)
Neutrophils Relative %: 65.3 %
Platelets: 210 10*3/uL (ref 140–400)
RBC: 4.94 10*6/uL (ref 4.20–5.80)
RDW: 12.1 % (ref 11.0–15.0)
Total Lymphocyte: 24.5 %
WBC: 5.5 10*3/uL (ref 3.8–10.8)

## 2020-11-30 LAB — COMPLETE METABOLIC PANEL WITH GFR
AG Ratio: 2.1 (calc) (ref 1.0–2.5)
ALT: 26 U/L (ref 9–46)
AST: 24 U/L (ref 10–35)
Albumin: 4.4 g/dL (ref 3.6–5.1)
Alkaline phosphatase (APISO): 60 U/L (ref 35–144)
BUN: 14 mg/dL (ref 7–25)
CO2: 27 mmol/L (ref 20–32)
Calcium: 9.6 mg/dL (ref 8.6–10.3)
Chloride: 104 mmol/L (ref 98–110)
Creat: 1.14 mg/dL (ref 0.70–1.30)
Globulin: 2.1 g/dL (calc) (ref 1.9–3.7)
Glucose, Bld: 85 mg/dL (ref 65–99)
Potassium: 4.1 mmol/L (ref 3.5–5.3)
Sodium: 141 mmol/L (ref 135–146)
Total Bilirubin: 0.9 mg/dL (ref 0.2–1.2)
Total Protein: 6.5 g/dL (ref 6.1–8.1)
eGFR: 75 mL/min/{1.73_m2} (ref >=60)

## 2020-11-30 LAB — URINALYSIS, ROUTINE W REFLEX MICROSCOPIC
Bilirubin Urine: NEGATIVE
Glucose, UA: NEGATIVE
Hgb urine dipstick: NEGATIVE
Ketones, ur: NEGATIVE
Leukocytes,Ua: NEGATIVE
Nitrite: NEGATIVE
Protein, ur: NEGATIVE
Specific Gravity, Urine: 1.013 (ref 1.001–1.035)
pH: 6.5 (ref 5.0–8.0)

## 2020-11-30 LAB — VITAMIN D 25 HYDROXY (VIT D DEFICIENCY, FRACTURES): Vit D, 25-Hydroxy: 87 ng/mL (ref 30–100)

## 2020-11-30 LAB — LIPID PANEL
Cholesterol: 171 mg/dL (ref <200)
HDL: 53 mg/dL (ref >=40)
LDL Cholesterol (Calc): 100 mg/dL (calc) — ABNORMAL HIGH
Non-HDL Cholesterol (Calc): 118 mg/dL (calc) (ref <130)
Total CHOL/HDL Ratio: 3.2 (calc) (ref <5.0)
Triglycerides: 85 mg/dL (ref <150)

## 2020-11-30 LAB — TSH: TSH: 2.31 mIU/L (ref 0.40–4.50)

## 2020-11-30 LAB — HEMOGLOBIN A1C
Hgb A1c MFr Bld: 5.6 % of total Hgb (ref <5.7)
Mean Plasma Glucose: 114 mg/dL
eAG (mmol/L): 6.3 mmol/L

## 2020-11-30 LAB — PSA: PSA: 2.72 ng/mL (ref <=4.00)

## 2020-11-30 LAB — VITAMIN B12: Vitamin B-12: 1945 pg/mL — ABNORMAL HIGH (ref 200–1100)

## 2020-11-30 LAB — INSULIN, RANDOM: Insulin: 10.4 u[IU]/mL

## 2020-11-30 NOTE — Progress Notes (Signed)
============================================================ -   Test results slightly outside the reference range are not unusual. If there is anything important, I will review this with you,  otherwise it is considered normal test values.  If you have further questions,  please do not hesitate to contact me at the office or via My Chart.  ============================================================ ============================================================  -  Iron & Vitamin B12 levels - both Normal & OK ============================================================ ============================================================  -  PSA - Low - Great ============================================================ ============================================================  -  Total Chol = 171 -  Excellent   - Very low risk for Heart Attack  / Stroke ============================================================ ============================================================  -  A1c = 5.6% - Back down again in Normal non-Diabetic range  ============================================================ ============================================================  -  Vitamin D = 87 - Excellent - Please keep dose same ============================================================ ============================================================  -  All Else - CBC - Kidneys - Electrolytes - Liver - Magnesium & Thyroid    - all  Normal / OK ============================================================ ============================================================

## 2020-12-25 ENCOUNTER — Other Ambulatory Visit: Payer: Self-pay

## 2020-12-25 DIAGNOSIS — Z1211 Encounter for screening for malignant neoplasm of colon: Secondary | ICD-10-CM

## 2020-12-25 DIAGNOSIS — Z1212 Encounter for screening for malignant neoplasm of rectum: Secondary | ICD-10-CM

## 2020-12-25 LAB — POC HEMOCCULT BLD/STL (HOME/3-CARD/SCREEN)
Card #2 Fecal Occult Blod, POC: NEGATIVE
Card #3 Fecal Occult Blood, POC: NEGATIVE
Fecal Occult Blood, POC: NEGATIVE

## 2020-12-26 DIAGNOSIS — Z1211 Encounter for screening for malignant neoplasm of colon: Secondary | ICD-10-CM | POA: Diagnosis not present

## 2020-12-26 DIAGNOSIS — Z1212 Encounter for screening for malignant neoplasm of rectum: Secondary | ICD-10-CM | POA: Diagnosis not present

## 2021-01-07 ENCOUNTER — Encounter: Payer: Self-pay | Admitting: Internal Medicine

## 2021-01-08 ENCOUNTER — Encounter: Payer: Self-pay | Admitting: Internal Medicine

## 2021-01-08 ENCOUNTER — Other Ambulatory Visit: Payer: Self-pay | Admitting: Internal Medicine

## 2021-01-08 ENCOUNTER — Other Ambulatory Visit (HOSPITAL_COMMUNITY): Payer: Self-pay

## 2021-01-08 DIAGNOSIS — K219 Gastro-esophageal reflux disease without esophagitis: Secondary | ICD-10-CM

## 2021-01-08 DIAGNOSIS — I10 Essential (primary) hypertension: Secondary | ICD-10-CM

## 2021-01-08 MED ORDER — LOSARTAN POTASSIUM 50 MG PO TABS
ORAL_TABLET | ORAL | 3 refills | Status: DC
  Filled 2021-01-08: qty 90, 90d supply, fill #0
  Filled 2021-04-07: qty 90, 90d supply, fill #1
  Filled 2021-07-14: qty 90, 90d supply, fill #2
  Filled 2021-10-10: qty 90, 90d supply, fill #3

## 2021-01-08 MED ORDER — VITAMIN C 1000 MG PO TABS: ORAL_TABLET | ORAL | Status: AC

## 2021-01-08 MED ORDER — OMEPRAZOLE 40 MG PO CPDR: DELAYED_RELEASE_CAPSULE | ORAL | 3 refills | Status: DC

## 2021-01-20 ENCOUNTER — Other Ambulatory Visit: Payer: Self-pay | Admitting: Internal Medicine

## 2021-01-20 ENCOUNTER — Other Ambulatory Visit (HOSPITAL_COMMUNITY): Payer: Self-pay

## 2021-01-20 DIAGNOSIS — K219 Gastro-esophageal reflux disease without esophagitis: Secondary | ICD-10-CM

## 2021-01-20 MED ORDER — OMEPRAZOLE 40 MG PO CPDR
40.0000 mg | DELAYED_RELEASE_CAPSULE | Freq: Every day | ORAL | 3 refills | Status: DC
  Filled 2021-01-20: qty 90, 90d supply, fill #0
  Filled 2021-04-21: qty 90, 90d supply, fill #1
  Filled 2021-07-21: qty 90, 90d supply, fill #2
  Filled 2021-10-10: qty 90, 90d supply, fill #3

## 2021-01-22 ENCOUNTER — Other Ambulatory Visit (HOSPITAL_COMMUNITY): Payer: Self-pay

## 2021-04-07 ENCOUNTER — Other Ambulatory Visit (HOSPITAL_COMMUNITY): Payer: Self-pay

## 2021-04-21 ENCOUNTER — Other Ambulatory Visit (HOSPITAL_COMMUNITY): Payer: Self-pay

## 2021-05-29 NOTE — Progress Notes (Signed)
?FOLLOW UP ? ?Assessment and Plan:  ? ?Hypertension ?Fairly controlled with current medications  ?Monitor blood pressure at home; patient to call if consistently greater than 130/80 ?Continue DASH diet.   ?Reminder to go to the ER if any CP, SOB, nausea, dizziness, severe HA, changes vision/speech, left arm numbness and tingling and jaw pain. ?- CBC ? ?Cholesterol ?Currently with mild elevations managed by lifestyle ?Continue low cholesterol diet and exercise.  ?- Lipid ?- CMP ? ?DDD, lumbar ?He will occ use Ibuprofen ?Continue to monitor ? ?Other abnormal glucose ?Intermittent prediabetes ?Continue diet and exercise.  ?Perform daily foot/skin check, notify office of any concerning changes.  ?- A1c ? ?Morbid Obesity with co morbidities ?Long discussion about weight loss, diet, and exercise ?Recommended diet heavy in fruits and veggies and low in animal meats, cheeses, and dairy products, appropriate calorie intake ?Discussed ideal weight for height and initial weight goal (230 lb) ?Doing well with lifestyle modification ?Will follow up in 3 months ?- TSH ? ?Vitamin D Def ?At goal at last visit; continue supplementation to maintain goal of 60-100 ?Defer Vit D level ? ?Medication Management ?Continued ? ? ? ? ?Continue diet and meds as discussed. Further disposition pending results of labs. Discussed med's effects and SE's.   ?Over 30 minutes of exam, counseling, chart review, and critical decision making was performed.  ? ?Future Appointments  ?Date Time Provider [place]  ?11/29/2021  2:00 PM Unk Pinto, MD [facility] None  ? ? ?---------------------------------------------------------------------------------------------------------------------- ? ?HPI ?61 y.o. male  presents for 3 month follow up on hypertension, cholesterol, prediabetes, morbid obesity, GERD and vitamin D deficiency.  ? ?he has a diagnosis of GERD which is currently managed by omeprazole 40 mg daily in AM; failed attempt to taper  with famotidine.  ? ?He continues to have back pain, worse with certain movements. Will occasionally take Ibuprofen but rarely. ? 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  ?Tried phentermine, couldn't tolerate even low dose 1/4 tab with urinary retention and constipation. He has read how not to die and has the cookbook ? ?Wt Readings from Last 3 Encounters:  ?05/30/21 253 lb 12.8 oz (115.1 kg)  ?11/29/20 250 lb 3.2 oz (113.5 kg)  ?05/01/20 239 lb (108.4 kg)  ? ?His blood pressure has been controlled at home (120-130/70-80s), today their BP is BP: 138/72  ?BP Readings from Last 3 Encounters:  ?05/30/21 138/72  ?11/29/20 124/80  ?05/01/20 132/88  ? He does workout. He denies chest pain, shortness of breath, dizziness. ? ? He is not on cholesterol medication and denies myalgias. His cholesterol is not at goal. The cholesterol last visit was:   ?Lab Results  ?Component Value Date  ? CHOL 171 11/29/2020  ? HDL 53 11/29/2020  ? LDLCALC 100 (H) 11/29/2020  ? TRIG 85 11/29/2020  ? CHOLHDL 3.2 11/29/2020  ? ? He has not been working on diet and exercise for hx of prediabetes (5.8% 05/2018, 6.0% 2016), and denies foot ulcerations, hyperglycemia, increased appetite, nausea, paresthesia of the feet, polydipsia, polyuria, visual disturbances, vomiting and weight loss. Last A1C in the office was:  ?Lab Results  ?Component Value Date  ? HGBA1C 5.6 11/29/2020  ? ? Last GFR:  ?Lab Results  ?Component Value Date  ? GFRNONAA 84 05/01/2020  ? ?Patient is on Vitamin D supplement and at goal:    ?Lab  Results  ?Component Value Date  ? VD25OH 87 11/29/2020  ?   ? ? ? ?Current Medications:  ?Current Outpatient Medications on File Prior to Visit  ?Medication Sig  ? Ascorbic Acid (VITAMIN C) 1000 MG tablet Take 1 tablet Daily  ? Bioflavonoid Products (BIOFLEX PO) Take by mouth.  ? Cholecalciferol 5000 units capsule Take  5,000 Units by mouth daily.  ? fexofenadine (ALLEGRA) 180 MG tablet Take 180 mg by mouth daily.   ? hydrocortisone-pramoxine (ANALPRAM-HC) 2.5-1 % rectal cream Place rectally 3 (three) times daily as needed for hemorrhoids or anal itching.  ? losartan (COZAAR) 50 MG tablet Take 1 tablet by mouth once daily for blood pressure  ? omeprazole (PRILOSEC) 40 MG capsule Take 1 capsule (40 mg total) by mouth daily for indigestion and heartburn.  ? OVER THE COUNTER MEDICATION TUMERIC  ? Probiotic Product (PROBIOTIC DAILY PO) Take by mouth daily.  ? Zinc 50 MG CAPS Take by mouth daily.  ? AMINO ACIDS PO Take by mouth daily. (Patient not taking: Reported on 11/29/2020)  ? [DISCONTINUED] phentermine (ADIPEX-P) 37.5 MG tablet TAKE 1/2 TO 1 TABLET EVERY MORNING FOR DIETING & WEIGHTLOSS  ? ?No current facility-administered medications on file prior to visit.  ? ? ? ?Allergies:  ?Allergies  ?Allergen Reactions  ? Iodides Hives  ?  ? ?Medical History:  ?Past Medical History:  ?Diagnosis Date  ? Allergic rhinitis   ? Anemia   ? Chest pain   ? GERD (gastroesophageal reflux disease)   ? GERD (gastroesophageal reflux disease) 08/01/2013  ? Hypertension   ? Lung nodule, solitary 11/11/2014  ? 11/11/2014 nodule noted; follow up CT done 11/28/2015- nodule stable, no follow up recommended.   ? Obesity   ? Tendonitis   ? Vitamin B12 deficiency   ? ?Family history- Reviewed and unchanged ?Social history- Reviewed and unchanged ? ? ?Review of Systems:  ?Review of Systems  ?Constitutional:  Negative for malaise/fatigue and weight loss.  ?HENT:  Negative for hearing loss and tinnitus.   ?Eyes:  Negative for blurred vision and double vision.  ?Respiratory:  Negative for cough, shortness of breath and wheezing.   ?Cardiovascular:  Negative for chest pain, palpitations, orthopnea, claudication and leg swelling.  ?Gastrointestinal:  Negative for abdominal pain, blood in stool, constipation, diarrhea, heartburn, melena, nausea and vomiting.   ?Genitourinary: Negative.   ?Musculoskeletal:  Positive for back pain. Negative for joint pain and myalgias.  ?Skin:  Negative for rash.  ?Neurological:  Negative for dizziness, tingling, sensory change, weakness and headaches.  ?Endo/Heme/Allergies:  Negative for polydipsia.  ?Psychiatric/Behavioral: Negative.    ?All other systems reviewed and are negative. ? ? ?Physical Exam: ?BP 138/72   Pulse 96   Temp 97.7 ?F (36.5 ?C)   Wt 253 lb 12.8 oz (115.1 kg)   SpO2 100%   BMI 37.48 kg/m?  ?Wt Readings from Last 3 Encounters:  ?05/30/21 253 lb 12.8 oz (115.1 kg)  ?11/29/20 250 lb 3.2 oz (113.5 kg)  ?05/01/20 239 lb (108.4 kg)  ? ?General Appearance: Very pleasant , obese male, in no apparent distress. ?Eyes: PERRLA, EOMs, conjunctiva no swelling or erythema ?Sinuses: No Frontal/maxillary tenderness ?ENT/Mouth: Ext aud canals clear, TMs without erythema, bulging. No erythema, swelling, or exudate on post pharynx.  Tonsils not swollen or erythematous. Hearing normal.  ?Neck: Supple, thyroid normal.  ?Respiratory: Respiratory effort normal, BS equal bilaterally without rales, rhonchi, wheezing or stridor.  ?Cardio: RRR with no MRGs. Brisk peripheral pulses without edema.  ?Abdomen:  Soft, + BS.  Non tender, no guarding, rebound, hernias, masses. ?Lymphatics: Non tender without lymphadenopathy.  ?Musculoskeletal: Full ROM, 5/5 strength, Normal gait. Neg straight leg raise. No spinous tenderness.  ?Skin: Warm, dry without rashes, lesions, ecchymosis.  ?Neuro: Cranial nerves intact. No cerebellar symptoms.  ?Psych: Awake and oriented X 3, normal affect, Insight and Judgment appropriate.  ? ? ?Magda Bernheim, NP ?3:46 PM ?[facility] ? ?

## 2021-05-30 ENCOUNTER — Ambulatory Visit (INDEPENDENT_AMBULATORY_CARE_PROVIDER_SITE_OTHER): Payer: BC Managed Care – PPO | Admitting: Nurse Practitioner

## 2021-05-30 ENCOUNTER — Encounter: Payer: Self-pay | Admitting: Nurse Practitioner

## 2021-05-30 VITALS — BP 138/72 | HR 96 | Temp 97.7°F | Wt 253.8 lb

## 2021-05-30 DIAGNOSIS — R7309 Other abnormal glucose: Secondary | ICD-10-CM | POA: Diagnosis not present

## 2021-05-30 DIAGNOSIS — M5136 Other intervertebral disc degeneration, lumbar region: Secondary | ICD-10-CM

## 2021-05-30 DIAGNOSIS — E782 Mixed hyperlipidemia: Secondary | ICD-10-CM | POA: Diagnosis not present

## 2021-05-30 DIAGNOSIS — E559 Vitamin D deficiency, unspecified: Secondary | ICD-10-CM

## 2021-05-30 DIAGNOSIS — I10 Essential (primary) hypertension: Secondary | ICD-10-CM

## 2021-05-30 DIAGNOSIS — Z79899 Other long term (current) drug therapy: Secondary | ICD-10-CM

## 2021-05-31 LAB — COMPLETE METABOLIC PANEL WITH GFR
AG Ratio: 2.2 (calc) (ref 1.0–2.5)
ALT: 30 U/L (ref 9–46)
AST: 26 U/L (ref 10–35)
Albumin: 4.3 g/dL (ref 3.6–5.1)
Alkaline phosphatase (APISO): 58 U/L (ref 35–144)
BUN: 16 mg/dL (ref 7–25)
CO2: 25 mmol/L (ref 20–32)
Calcium: 9.2 mg/dL (ref 8.6–10.3)
Chloride: 105 mmol/L (ref 98–110)
Creat: 1.09 mg/dL (ref 0.70–1.30)
Globulin: 2 g/dL (calc) (ref 1.9–3.7)
Glucose, Bld: 107 mg/dL — ABNORMAL HIGH (ref 65–99)
Potassium: 4 mmol/L (ref 3.5–5.3)
Sodium: 140 mmol/L (ref 135–146)
Total Bilirubin: 0.9 mg/dL (ref 0.2–1.2)
Total Protein: 6.3 g/dL (ref 6.1–8.1)
eGFR: 79 mL/min/{1.73_m2} (ref >=60)

## 2021-05-31 LAB — CBC WITH DIFFERENTIAL/PLATELET
Absolute Monocytes: 612 cells/uL (ref 200–950)
Basophils Absolute: 43 cells/uL (ref 0–200)
Basophils Relative: 0.5 %
Eosinophils Absolute: 68 cells/uL (ref 15–500)
Eosinophils Relative: 0.8 %
HCT: 41.6 % (ref 38.5–50.0)
Hemoglobin: 14.1 g/dL (ref 13.2–17.1)
Lymphs Abs: 1335 cells/uL (ref 850–3900)
MCH: 30.4 pg (ref 27.0–33.0)
MCHC: 33.9 g/dL (ref 32.0–36.0)
MCV: 89.7 fL (ref 80.0–100.0)
MPV: 11.2 fL (ref 7.5–12.5)
Monocytes Relative: 7.2 %
Neutro Abs: 6443 cells/uL (ref 1500–7800)
Neutrophils Relative %: 75.8 %
Platelets: 229 10*3/uL (ref 140–400)
RBC: 4.64 10*6/uL (ref 4.20–5.80)
RDW: 12.7 % (ref 11.0–15.0)
Total Lymphocyte: 15.7 %
WBC: 8.5 10*3/uL (ref 3.8–10.8)

## 2021-05-31 LAB — TSH: TSH: 1.75 mIU/L (ref 0.40–4.50)

## 2021-05-31 LAB — LIPID PANEL
Cholesterol: 168 mg/dL (ref <200)
HDL: 51 mg/dL (ref >=40)
LDL Cholesterol (Calc): 90 mg/dL (calc)
Non-HDL Cholesterol (Calc): 117 mg/dL (calc) (ref <130)
Total CHOL/HDL Ratio: 3.3 (calc) (ref <5.0)
Triglycerides: 174 mg/dL — ABNORMAL HIGH (ref <150)

## 2021-05-31 LAB — HEMOGLOBIN A1C
Hgb A1c MFr Bld: 5.9 % of total Hgb — ABNORMAL HIGH (ref <5.7)
Mean Plasma Glucose: 123 mg/dL
eAG (mmol/L): 6.8 mmol/L

## 2021-07-17 ENCOUNTER — Other Ambulatory Visit (HOSPITAL_COMMUNITY): Payer: Self-pay

## 2021-07-23 ENCOUNTER — Other Ambulatory Visit (HOSPITAL_COMMUNITY): Payer: Self-pay

## 2021-08-01 ENCOUNTER — Ambulatory Visit (AMBULATORY_SURGERY_CENTER): Payer: 59 | Admitting: *Deleted

## 2021-08-01 ENCOUNTER — Other Ambulatory Visit (HOSPITAL_COMMUNITY): Payer: Self-pay

## 2021-08-01 VITALS — Ht 69.0 in | Wt 243.0 lb

## 2021-08-01 DIAGNOSIS — Z1211 Encounter for screening for malignant neoplasm of colon: Secondary | ICD-10-CM

## 2021-08-01 MED ORDER — NA SULFATE-K SULFATE-MG SULF 17.5-3.13-1.6 GM/177ML PO SOLN
1.0000 | Freq: Once | ORAL | 0 refills | Status: AC
  Filled 2021-08-01: qty 354, 1d supply, fill #0

## 2021-08-01 NOTE — Progress Notes (Signed)
Pt's previsit is done over the phone and all paperwork (prep instructions, blank consent form to just read over) sent to patient via [place].  Pt's name and DOB verified at the beginning of the previsit.  Pt denies any difficulty with ambulating.   Pt is off Phentermine   No trouble with anesthesia, denies being told they were difficult to intubate, or hx/fam hx of malignant hyperthermia per pt   No egg or soy allergy  No home oxygen use   No medications for weight loss taken  emmi information given  Pt denies constipation issues  Pt informed that we do not do prior authorizations for prep

## 2021-08-10 ENCOUNTER — Encounter: Payer: Self-pay | Admitting: Gastroenterology

## 2021-08-15 ENCOUNTER — Ambulatory Visit (AMBULATORY_SURGERY_CENTER): Payer: BC Managed Care – PPO | Admitting: Gastroenterology

## 2021-08-15 ENCOUNTER — Encounter: Payer: Self-pay | Admitting: Gastroenterology

## 2021-08-15 VITALS — BP 133/81 | HR 76 | Temp 97.8°F | Resp 13 | Ht 69.0 in | Wt 243.0 lb

## 2021-08-15 DIAGNOSIS — K635 Polyp of colon: Secondary | ICD-10-CM | POA: Diagnosis not present

## 2021-08-15 DIAGNOSIS — D123 Benign neoplasm of transverse colon: Secondary | ICD-10-CM

## 2021-08-15 DIAGNOSIS — Z1211 Encounter for screening for malignant neoplasm of colon: Secondary | ICD-10-CM | POA: Diagnosis not present

## 2021-08-15 MED ORDER — SODIUM CHLORIDE 0.9 % IV SOLN: 500.0000 mL | Freq: Once | INTRAVENOUS | Status: DC

## 2021-08-15 NOTE — Op Note (Signed)
[facility] Patient Name: Albert Griffin Procedure Date: 08/15/2021 1:31 PM MRN: [id number] Endoscopist: Nicki Reaper E. Candis Schatz , MD Age: 61 Referring MD:  Date of Birth: May 18, 1960 Gender: Male Account #: [id number] Procedure:                Colonoscopy Indications:              Screening for colorectal malignant neoplasm (last                            colonoscopy was 10 years ago) Medicines:                Monitored Anesthesia Care Procedure:                Pre-Anesthesia Assessment:                           - Prior to the procedure, a History and Physical                            was performed, and patient medications and                            allergies were reviewed. The patient's tolerance of                            previous anesthesia was also reviewed. The risks                            and benefits of the procedure and the sedation                            options and risks were discussed with the patient.                            All questions were answered, and informed consent                            was obtained. Prior Anticoagulants: The patient has                            taken no previous anticoagulant or antiplatelet                            agents. ASA Grade Assessment: II - A patient with                            mild systemic disease. After reviewing the risks                            and benefits, the patient was deemed in                            satisfactory condition to undergo the procedure.  After obtaining informed consent, the colonoscope                            was passed under direct vision. Throughout the                            procedure, the patient's blood pressure, pulse, and                            oxygen saturations were monitored continuously. The                            CF [id number] #[id number] was introduced through the anus                            and advanced to the the  terminal ileum, with                            identification of the appendiceal orifice and IC                            valve. The colonoscopy was performed without                            difficulty. The patient tolerated the procedure                            well. The quality of the bowel preparation was                            excellent. The terminal ileum, ileocecal valve,                            appendiceal orifice, and rectum were photographed.                            The bowel preparation used was SUPREP via split                            dose instruction. Scope In: 1:38:43 PM Scope Out: 1:53:48 PM Scope Withdrawal Time: 0 hours 10 minutes 21 seconds  Total Procedure Duration: 0 hours 15 minutes 5 seconds  Findings:                 The perianal and digital rectal examinations were                            normal. Pertinent negatives include normal                            sphincter tone and no palpable rectal lesions.                           A 3 mm polyp was found in the distal transverse  colon. The polyp was sessile. The polyp was removed                            with a cold snare. Resection and retrieval were                            complete. Estimated blood loss was minimal.                           A few small and large-mouthed diverticula were                            found in the sigmoid colon, descending colon and                            transverse colon. There was no evidence of                            diverticular bleeding.                           The exam was otherwise normal throughout the                            examined colon.                           The terminal ileum appeared normal.                           The retroflexed view of the distal rectum and anal                            verge was normal and showed no anal or rectal                            abnormalities. Complications:             No immediate complications. Estimated Blood Loss:     Estimated blood loss was minimal. Impression:               - One 3 mm polyp in the distal transverse colon,                            removed with a cold snare. Resected and retrieved.                           - Mild diverticulosis in the sigmoid colon, in the                            descending colon and in the transverse colon. There                            was no evidence of diverticular bleeding.                           -  The examined portion of the ileum was normal.                           - The distal rectum and anal verge are normal on                            retroflexion view. Recommendation:           - Patient has a contact number available for                            emergencies. The signs and symptoms of potential                            delayed complications were discussed with the                            patient. Return to normal activities tomorrow.                            Written discharge instructions were provided to the                            patient.                           - Resume previous diet.                           - Continue present medications.                           - Await pathology results.                           - Repeat colonoscopy (date not yet determined) for                            surveillance based on pathology results. Alika Eppes E. Candis Schatz, MD 08/15/2021 2:00:55 PM This report has been signed electronically.

## 2021-08-15 NOTE — Progress Notes (Signed)
Called to room to assist during endoscopic procedure.  Patient ID and intended procedure confirmed with present staff. Received instructions for my participation in the procedure from the performing physician.  

## 2021-08-15 NOTE — Progress Notes (Signed)
To pacu, VSS. Report to [email or web address] 

## 2021-08-15 NOTE — Progress Notes (Signed)
Pt's states no medical or surgical changes since previsit or office visit. 

## 2021-08-15 NOTE — Progress Notes (Signed)
[facility] History and Physical   Primary Care Physician:  Unk Pinto, MD   Reason for Procedure:   Colon cancer screening  Plan:    Screening colonoscopy     HPI: Albert Griffin is a 61 y.o. male undergoing average risk screening colonoscopy.  He has no family history of colon cancer and no chronic lower GI symptoms. He had a normal colonoscopy in 2013.   Past Medical History:  Diagnosis Date   Allergic rhinitis    Anemia    unsure of this   Arthritis    Chest pain    GERD (gastroesophageal reflux disease)    GERD (gastroesophageal reflux disease) 08/01/2013   Hypertension    Lung nodule, solitary 11/11/2014   11/11/2014 nodule noted; follow up CT done 11/28/2015- nodule stable, no follow up recommended.    Obesity    Tendonitis    Vitamin B12 deficiency     Past Surgical History:  Procedure Laterality Date   BACK SURGERY     x2, L4-L5   BUNIONECTOMY Left 02/14/2014   CERVICAL DISC SURGERY  2003   COLONOSCOPY     UPPER GASTROINTESTINAL ENDOSCOPY      Prior to Admission medications   Medication Sig Start Date End Date Taking? Authorizing Provider  Ascorbic Acid (VITAMIN C) 1000 MG tablet Take 1 tablet Daily 01/08/21  Yes Unk Pinto, MD  Cholecalciferol 5000 units capsule Take 5,000 Units by mouth daily.   Yes [provider]  fexofenadine (ALLEGRA) 180 MG tablet Take 180 mg by mouth daily.    Yes [provider]  losartan (COZAAR) 50 MG tablet Take 1 tablet by mouth once daily for blood pressure 01/08/21  Yes Unk Pinto, MD  omeprazole (PRILOSEC) 40 MG capsule Take 1 capsule (40 mg total) by mouth daily for indigestion and heartburn. 01/20/21  Yes Alycia Rossetti, NP  OVER THE [place]   Yes [provider]  Probiotic Product (PROBIOTIC DAILY PO) Take by mouth daily.   Yes [provider]  Bioflavonoid Products (BIOFLEX PO) Take by mouth.    [provider]   hydrocortisone-pramoxine [facility]) 2.5-1 % rectal cream Place rectally 3 (three) times daily as needed for hemorrhoids or anal itching. 06/17/20   Unk Pinto, MD  Zinc 50 MG CAPS Take by mouth daily.    [provider]  phentermine (ADIPEX-P) 37.5 MG tablet TAKE 1/2 TO 1 TABLET EVERY MORNING FOR DIETING & WEIGHTLOSS 12/25/19 05/01/20  Liane Comber, NP    Current Outpatient Medications  Medication Sig Dispense Refill   Ascorbic Acid (VITAMIN C) 1000 MG tablet Take 1 tablet Daily     Cholecalciferol 5000 units capsule Take 5,000 Units by mouth daily.     fexofenadine (ALLEGRA) 180 MG tablet Take 180 mg by mouth daily.      losartan (COZAAR) 50 MG tablet Take 1 tablet by mouth once daily for blood pressure 90 tablet 3   omeprazole (PRILOSEC) 40 MG capsule Take 1 capsule (40 mg total) by mouth daily for indigestion and heartburn. 90 capsule 3   OVER THE COUNTER MEDICATION TUMERIC     Probiotic Product (PROBIOTIC DAILY PO) Take by mouth daily.     Bioflavonoid Products (BIOFLEX PO) Take by mouth.     hydrocortisone-pramoxine (ANALPRAM-HC) 2.5-1 % rectal cream Place rectally 3 (three) times daily as needed for hemorrhoids or anal itching. 90 g 1   Zinc 50 MG CAPS Take by mouth daily.     Current Facility-Administered Medications  Medication Dose Route Frequency Provider Last Rate Last Admin   0.9 %  sodium chloride infusion  500 mL Intravenous Once Daryel November, MD        Allergies as of 08/15/2021 - Review Complete 08/15/2021  Allergen Reaction Noted   Iodides Hives 05/31/2011    Family History  Problem Relation Age of Onset   Lung cancer Mother    Breast cancer Mother    Dementia Mother    Esophageal cancer Father    Heart disease Sister    Melanoma Sister    Ovarian cancer Sister    COPD Sister    Heart disease Maternal Uncle    Brain cancer Paternal Uncle    Cancer Maternal Grandfather    Colon cancer Neg Hx    Rectal cancer Neg Hx    Stomach  cancer Neg Hx     Social History   Socioeconomic History   Marital status: Married    Spouse name: Not on file   Number of children: 2   Years of education: Not on file   Highest education level: Not on file  Occupational History    Employer: [employer]  Tobacco Use   Smoking status: Never   Smokeless tobacco: Former    Types: Chew    Quit date: 11/10/2000  Vaping Use   Vaping Use: Never used  Substance and Sexual Activity   Alcohol use: Yes    Alcohol/week: 0.0 standard drinks of alcohol    Comment: more during the summer and minimal after labor day   Drug use: No   Sexual activity: Yes    Partners: Female    Birth control/protection: None  Other Topics Concern   Not on file  Social History Narrative   Not on file   Social Determinants of Health   Financial Resource Strain: Not on file  Food Insecurity: Not on file  Transportation Needs: Not on file  Physical Activity: Sufficiently Active (11/24/2017)   Exercise Vital Sign    Days of Exercise per Week: 5 days    Minutes of Exercise per Session: 60 min  Stress: No Stress Concern Present (11/24/2017)   [place]    Feeling of Stress : Only a little  Social Connections: Not on file  Intimate Partner Violence: Not on file    Review of Systems:  All other review of systems negative except as mentioned in the HPI.  Physical Exam: Vital signs BP 125/71   Pulse 71   Temp 97.8 F (36.6 C)   Ht '5\' 9"'$  (1.753 m)   Wt 243 lb (110.2 kg)   SpO2 97%   BMI 35.88 kg/m   General:   Alert,  Well-developed, well-nourished, pleasant and cooperative in NAD Airway:  Mallampati 2 Lungs:  Clear throughout to auscultation.   Heart:  Regular rate and rhythm; no murmurs, clicks, rubs,  or gallops. Abdomen:  Soft, nontender and nondistended. Normal bowel sounds.   Neuro/Psych:  Normal mood and affect. A and O x 3   Albert Griffin E. Candis Schatz, MD [facility]  Gastroenterology

## 2021-08-15 NOTE — Patient Instructions (Addendum)
Handouts on diverticulosis and polyps given.     YOU HAD AN ENDOSCOPIC PROCEDURE TODAY AT [facility]:   Refer to the procedure report that was given to you for any specific questions about what was found during the examination.  If the procedure report does not answer your questions, please call your gastroenterologist to clarify.  If you requested that your care partner not be given the details of your procedure findings, then the procedure report has been included in a sealed envelope for you to review at your convenience later.  YOU SHOULD EXPECT: Some feelings of bloating in the abdomen. Passage of more gas than usual.  Walking can help get rid of the air that was put into your GI tract during the procedure and reduce the bloating. If you had a lower endoscopy (such as a colonoscopy or flexible sigmoidoscopy) you may notice spotting of blood in your stool or on the toilet paper. If you underwent a bowel prep for your procedure, you may not have a normal bowel movement for a few days.  Please Note:  You might notice some irritation and congestion in your nose or some drainage.  This is from the oxygen used during your procedure.  There is no need for concern and it should clear up in a day or so.  SYMPTOMS TO REPORT IMMEDIATELY:  Following lower endoscopy (colonoscopy or flexible sigmoidoscopy):  Excessive amounts of blood in the stool  Significant tenderness or worsening of abdominal pains  Swelling of the abdomen that is new, acute             Fever of 100 degrees or higher.   For urgent or emergent issues, a gastroenterologist can be reached at any hour by calling [phone]. Do not use MyChart messaging for urgent concerns.    DIET:  We do recommend a small meal at first, but then you may proceed to your regular diet.  Drink plenty of fluids but you should avoid alcoholic beverages for 24 hours.  ACTIVITY:  You should plan to take it easy for the rest of today  and you should NOT DRIVE or use heavy machinery until tomorrow (because of the sedation medicines used during the test).    FOLLOW UP: Our staff will call the number listed on your records the next business day following your procedure.  We will call around 7:15- 8:00 am to check on you and address any questions or concerns that you may have regarding the information given to you following your procedure. If we do not reach you, we will leave a message.  If you develop any symptoms (ie: fever, flu-like symptoms, shortness of breath, cough etc.) before then, please call [phone].  If you test positive for Covid 19 in the 2 weeks post procedure, please call and report this information to [place].    If any biopsies were taken you will be contacted by phone or by letter within the next 1-3 weeks.  Please call us at [phone] if you have not heard about the biopsies in 3 weeks.    SIGNATURES/CONFIDENTIALITY: You and/or your care partner have signed paperwork which will be entered into your electronic medical record.  These signatures attest to the fact that that the information above on your After Visit Summary has been reviewed and is understood.  Full responsibility of the confidentiality of this discharge information lies with you and/or your care-partner.

## 2021-08-16 ENCOUNTER — Telehealth: Payer: Self-pay | Admitting: *Deleted

## 2021-08-16 NOTE — Telephone Encounter (Signed)
  Follow up Call-     08/15/2021   12:44 PM  Call back number  Post procedure Call Back phone  # [phone]  Permission to leave phone message Yes     Patient questions:  Do you have a fever, pain , or abdominal swelling? No. Pain Score  0 *  Have you tolerated food without any problems? Yes.    Have you been able to return to your normal activities? Yes.    Do you have any questions about your discharge instructions: Diet   No. Medications  No. Follow up visit  No.  Do you have questions or concerns about your Care? No.  Actions: * If pain score is 4 or above: No action needed, pain <4.

## 2021-08-23 NOTE — Progress Notes (Signed)
Albert Griffin, The polyp which I removed during your recent procedure was proven to be completely benign but is considered a "pre-cancerous" polyp that MAY have grown into cancer if it had not been removed.  Studies shows that at least 20% of women over age 59 and 30% of men over age 79 have pre-cancerous polyps.  Based on current nationally recognized surveillance guidelines, I recommend that you have a repeat colonoscopy in 7 years.   If you develop any new rectal bleeding, abdominal pain or significant bowel habit changes, please contact me before then.

## 2021-10-10 ENCOUNTER — Other Ambulatory Visit (HOSPITAL_COMMUNITY): Payer: Self-pay

## 2021-10-11 ENCOUNTER — Other Ambulatory Visit (HOSPITAL_COMMUNITY): Payer: Self-pay

## 2021-10-24 DIAGNOSIS — L821 Other seborrheic keratosis: Secondary | ICD-10-CM | POA: Diagnosis not present

## 2021-10-24 DIAGNOSIS — D225 Melanocytic nevi of trunk: Secondary | ICD-10-CM | POA: Diagnosis not present

## 2021-10-24 DIAGNOSIS — L578 Other skin changes due to chronic exposure to nonionizing radiation: Secondary | ICD-10-CM | POA: Diagnosis not present

## 2021-10-24 DIAGNOSIS — L814 Other melanin hyperpigmentation: Secondary | ICD-10-CM | POA: Diagnosis not present

## 2021-11-29 ENCOUNTER — Ambulatory Visit (INDEPENDENT_AMBULATORY_CARE_PROVIDER_SITE_OTHER): Payer: BC Managed Care – PPO | Admitting: Internal Medicine

## 2021-11-29 ENCOUNTER — Encounter: Payer: Self-pay | Admitting: Internal Medicine

## 2021-11-29 VITALS — BP 134/76 | HR 82 | Temp 97.7°F | Resp 16 | Ht 69.0 in | Wt 258.0 lb

## 2021-11-29 DIAGNOSIS — Z8249 Family history of ischemic heart disease and other diseases of the circulatory system: Secondary | ICD-10-CM | POA: Diagnosis not present

## 2021-11-29 DIAGNOSIS — Z1389 Encounter for screening for other disorder: Secondary | ICD-10-CM

## 2021-11-29 DIAGNOSIS — Z79899 Other long term (current) drug therapy: Secondary | ICD-10-CM

## 2021-11-29 DIAGNOSIS — N401 Enlarged prostate with lower urinary tract symptoms: Secondary | ICD-10-CM | POA: Diagnosis not present

## 2021-11-29 DIAGNOSIS — E559 Vitamin D deficiency, unspecified: Secondary | ICD-10-CM | POA: Diagnosis not present

## 2021-11-29 DIAGNOSIS — Z130 Encounter for screening for diseases of the blood and blood-forming organs and certain disorders involving the immune mechanism: Secondary | ICD-10-CM | POA: Diagnosis not present

## 2021-11-29 DIAGNOSIS — Z111 Encounter for screening for respiratory tuberculosis: Secondary | ICD-10-CM | POA: Diagnosis not present

## 2021-11-29 DIAGNOSIS — I700 Atherosclerosis of aorta: Secondary | ICD-10-CM | POA: Diagnosis not present

## 2021-11-29 DIAGNOSIS — Z1329 Encounter for screening for other suspected endocrine disorder: Secondary | ICD-10-CM | POA: Diagnosis not present

## 2021-11-29 DIAGNOSIS — Z131 Encounter for screening for diabetes mellitus: Secondary | ICD-10-CM | POA: Diagnosis not present

## 2021-11-29 DIAGNOSIS — Z0000 Encounter for general adult medical examination without abnormal findings: Secondary | ICD-10-CM | POA: Diagnosis not present

## 2021-11-29 DIAGNOSIS — Z136 Encounter for screening for cardiovascular disorders: Secondary | ICD-10-CM | POA: Diagnosis not present

## 2021-11-29 DIAGNOSIS — Z13220 Encounter for screening for lipoid disorders: Secondary | ICD-10-CM | POA: Diagnosis not present

## 2021-11-29 DIAGNOSIS — R350 Frequency of micturition: Secondary | ICD-10-CM | POA: Diagnosis not present

## 2021-11-29 DIAGNOSIS — I10 Essential (primary) hypertension: Secondary | ICD-10-CM | POA: Diagnosis not present

## 2021-11-29 DIAGNOSIS — R5383 Other fatigue: Secondary | ICD-10-CM

## 2021-11-29 DIAGNOSIS — R7309 Other abnormal glucose: Secondary | ICD-10-CM

## 2021-11-29 DIAGNOSIS — E782 Mixed hyperlipidemia: Secondary | ICD-10-CM

## 2021-11-29 DIAGNOSIS — Z125 Encounter for screening for malignant neoplasm of prostate: Secondary | ICD-10-CM

## 2021-11-29 DIAGNOSIS — K760 Fatty (change of) liver, not elsewhere classified: Secondary | ICD-10-CM

## 2021-11-29 DIAGNOSIS — Z1211 Encounter for screening for malignant neoplasm of colon: Secondary | ICD-10-CM

## 2021-11-29 DIAGNOSIS — Z0001 Encounter for general adult medical examination with abnormal findings: Secondary | ICD-10-CM

## 2021-11-29 DIAGNOSIS — K219 Gastro-esophageal reflux disease without esophagitis: Secondary | ICD-10-CM

## 2021-11-29 NOTE — Patient Instructions (Signed)

## 2021-11-29 NOTE — Progress Notes (Signed)
Annual  Screening/Preventative Visit  & Comprehensive Evaluation & Examination  Future Appointments  Date Time Provider Department  11/29/2021  2:00 PM Unk Pinto, MD [facility]  12/04/2022  2:00 PM Unk Pinto, MD [facility]           This very nice 61 y.o. MWM presents for a Screening /Preventative Visit & comprehensive evaluation and management of multiple medical co-morbidities.  Patient has been followed for HTN, HLD, Prediabetes and Vitamin D Deficiency.       HTN predates circa 1996. Patient's BP has been controlled at home.  Today's BP is at  134/76 . Patient denies any cardiac symptoms as chest pain, palpitations, shortness of breath, dizziness or ankle swelling.       Patient's hyperlipidemia is controlled with diet and medications. Patient denies myalgias or other medication SE's. Last lipids were  at goal:  Lab Results  Component Value Date   CHOL 168 05/30/2021   HDL 51 05/30/2021   LDLCALC 90 05/30/2021   TRIG 174 (H) 05/30/2021   CHOLHDL 3.3 05/30/2021         Patient has hx/o prediabetes since    and patient denies reactive hypoglycemic symptoms, visual blurring, diabetic polys or paresthesias. Last A1c was near goal:    Lab Results  Component Value Date   HGBA1C 5.9 (H) 05/30/2021         Finally, patient has history of Vitamin D Deficiency ("33" /2018) and last vitamin D was at goal :   Lab Results  Component Value Date   VD25OH 87 11/29/2020       Current Outpatient Medications  Medication Instructions   VITAMIN C 1000 MG tablet Take 1 tablet Daily   BIOFLEX  Oral   Cholecalciferol 5,000 Units, Oral, Daily   fexofenadine (ALLEGRA) 180 mg, Oral, Daily   hydrocortisone-pramoxine (ANALPRAM-HC) 2.5-1 % rectal cream Rectal, 3 times daily PRN   losartan (COZAAR) 50 MG tablet Take 1 tablet daily for blood pressure   omeprazole (PRILOSEC) 40 MG capsule Take 1 capsule  daily    TUMERIC     Probiotic  Oral, Daily   Zinc 50 MG CAPS Oral,  Daily     Allergies  Allerge Reactions   Iodides Hives     Past Medical History:  Diagnosis Date   Allergic rhinitis    Anemia    Chest pain    GERD (gastroesophageal reflux disease)    GERD (gastroesophageal reflux disease) 08/01/2013   Hypertension    Lung nodule, solitary 11/11/2014   11/11/2014 nodule noted; follow up CT done 11/28/2015- nodule stable, no follow up recommended.    Obesity    Tendonitis    Vitamin B12 deficiency      Health Maintenance  Topic Date Due   Zoster Vaccines- Shingrix (1 of 2) Never done   COVID-19 Vaccine (3 - Pfizer risk series) 11/24/2019   INFLUENZA VACCINE  Never done   COLONOSCOPY  07/09/2021   TETANUS/TDAP  10/08/2022   Hepatitis C Screening  Completed   HIV Screening  Completed   HPV VACCINES  Aged Out     Immunization History  Administered Date(s) Administered   PFIZER SARS-COV-2 Vacc 10/06/2019, 10/27/2019   Pneumococcal-23 02/19/1995   Tdap 10/07/2012    Last Colon - 07/10/2011 - Dr Sharlett Iles - Recc 10 year f/u due May 2023  Past Surgical History:  Procedure Laterality Date   BACK SURGERY     x2, L4-L5   BUNIONECTOMY Left 02/14/2014   CERVICAL DISC  SURGERY  2003     Family History  Problem Relation Age of Onset   Lung cancer Mother    Breast cancer Mother    Dementia Mother    Esophageal cancer Father    Heart disease Sister    Melanoma Sister    Ovarian cancer Sister    COPD Sister    Heart disease Maternal Uncle    Brain cancer Paternal Uncle    Cancer Maternal Grandfather      Social History   Tobacco Use   Smoking status: Never   Smokeless tobacco: Former    Types: Chew    Quit date: 11/10/2000  Vaping Use   Vaping Use: Never used  Substance Use Topics   Alcohol use: Yes    Alcohol/week: 0.0 standard drinks    Comment: more during the summer and minimal after labor day   Drug use: No      ROS Constitutional: Denies fever, chills, weight loss/gain, headaches, insomnia,  night sweats or  change in appetite. Does c/o fatigue. Eyes: Denies redness, blurred vision, diplopia, discharge, itchy or watery eyes.  ENT: Denies discharge, congestion, post nasal drip, epistaxis, sore throat, earache, hearing loss, dental pain, Tinnitus, Vertigo, Sinus pain or snoring.  Cardio: Denies chest pain, palpitations, irregular heartbeat, syncope, dyspnea, diaphoresis, orthopnea, PND, claudication or edema Respiratory: denies cough, dyspnea, DOE, pleurisy, hoarseness, laryngitis or wheezing.  Gastrointestinal: Denies dysphagia, heartburn, reflux, water brash, pain, cramps, nausea, vomiting, bloating, diarrhea, constipation, hematemesis, melena, hematochezia, jaundice or hemorrhoids Genitourinary: Denies dysuria, frequency, urgency, nocturia, hesitancy, discharge, hematuria or flank pain Musculoskeletal: Denies arthralgia, myalgia, stiffness, Jt. Swelling, pain, limp or strain/sprain. Denies Falls. Skin: Denies puritis, rash, hives, warts, acne, eczema or change in skin lesion Neuro: No weakness, tremor, incoordination, spasms, paresthesia or pain Psychiatric: Denies confusion, memory loss or sensory loss. Denies Depression. Endocrine: Denies change in weight, skin, hair change, nocturia, and paresthesia, diabetic polys, visual blurring or hyper / hypo glycemic episodes.  Heme/Lymph: No excessive bleeding, bruising or enlarged lymph nodes.   Physical Exam  BP 134/76   Pulse 82   Temp 97.7 F (36.5 C)   Resp 16   Ht '5\' 9"'$  (1.753 m)   Wt 258 lb (117 kg)   SpO2 97%   BMI 38.10 kg/m   General Appearance: over  nourished  and in no apparent distress.  Eyes: PERRLA, EOMs, conjunctiva no swelling or erythema, normal fundi and vessels. Sinuses: No frontal/maxillary tenderness ENT/Mouth: EACs patent / TMs  nl. Nares clear without erythema, swelling, mucoid exudates. Oral hygiene is good. No erythema, swelling, or exudate. Tongue normal, non-obstructing. Tonsils not swollen or erythematous. Hearing  normal.  Neck: Supple, thyroid not palpable. No bruits, nodes or JVD. Respiratory: Respiratory effort normal.  BS equal and clear bilateral without rales, rhonci, wheezing or stridor. Cardio: Heart sounds are normal with regular rate and rhythm and no murmurs, rubs or gallops. Peripheral pulses are normal and equal bilaterally without edema. No aortic or femoral bruits. Chest: symmetric with normal excursions and percussion.  Abdomen: Soft, with Nl bowel sounds. Nontender, no guarding, rebound, hernias, masses, or organomegaly.  Lymphatics: Non tender without lymphadenopathy.  Musculoskeletal: Full ROM all peripheral extremities, joint stability, 5/5 strength, and normal gait. Skin: Warm and dry without rashes, lesions, cyanosis, clubbing or  ecchymosis.  Neuro: Cranial nerves intact, reflexes equal bilaterally. Normal muscle tone, no cerebellar symptoms. Sensation intact.  Pysch: Alert and oriented X 3 with normal affect, insight and judgment appropriate.   Assessment  and Plan  1. Annual Preventative/Screening Exam   1. Encounter for routine adult health examination with abnormal findings   2. Essential hypertension  - EKG 12-Lead - [place], RETROPERITNL ABD,  LTD - Urinalysis, Routine w reflex microscopic - Microalbumin / creatinine urine ratio - CBC with Differential/Platelet - COMPLETE METABOLIC PANEL WITH GFR - Magnesium - TSH  3. Hyperlipidemia, mixed  - EKG 12-Lead - [place], RETROPERITNL ABD,  LTD - Lipid panel - TSH  4. Abnormal glucose  - EKG 12-Lead - [place], RETROPERITNL ABD,  LTD - Hemoglobin A1c - Insulin, random  5. Vitamin D deficiency  - VITAMIN D 25 Hydroxy   6. Morbid obesity (HCC)  - TSH  7. Fatty liver   8. Screening-pulmonary TB  - TB Skin Test  9. Gastroesophageal reflux disease    10. Screening for prostate cancer  - PSA  11. Screening for heart disease  - EKG 12-Lead  12. FHx: heart disease  - EKG 12-Lead - [place], RETROPERITNL ABD,   LTD  13. Screening for AAA (aortic abdominal aneurysm)  - [place], RETROPERITNL ABD,  LTD  14. Fatigue  - Iron, Total/Total Iron Binding Cap - Vitamin B12 - Testosterone  15. Medication management  - Urinalysis, Routine w reflex microscopic - Microalbumin / creatinine urine ratio  16. Screening for colorectal cancer  - POC Hemoccult Bld/Stl            Patient was counseled in prudent diet, weight control to achieve/maintain BMI less than 25, BP monitoring, regular exercise and medications as discussed.  Discussed med effects and SE's. Routine screening labs and tests as requested with regular follow-up as recommended. Over 40 minutes of exam, counseling, chart review and high complex critical decision making was performed   Kirtland Bouchard, MD

## 2021-11-30 LAB — CBC WITH DIFFERENTIAL/PLATELET
Absolute Monocytes: 510 cells/uL (ref 200–950)
Basophils Absolute: 53 cells/uL (ref 0–200)
Basophils Relative: 0.7 %
Eosinophils Absolute: 120 cells/uL (ref 15–500)
Eosinophils Relative: 1.6 %
HCT: 43.3 % (ref 38.5–50.0)
Hemoglobin: 14.6 g/dL (ref 13.2–17.1)
Lymphs Abs: 1568 cells/uL (ref 850–3900)
MCH: 30.8 pg (ref 27.0–33.0)
MCHC: 33.7 g/dL (ref 32.0–36.0)
MCV: 91.4 fL (ref 80.0–100.0)
MPV: 11.1 fL (ref 7.5–12.5)
Monocytes Relative: 6.8 %
Neutro Abs: 5250 cells/uL (ref 1500–7800)
Neutrophils Relative %: 70 %
Platelets: 229 10*3/uL (ref 140–400)
RBC: 4.74 10*6/uL (ref 4.20–5.80)
RDW: 12.4 % (ref 11.0–15.0)
Total Lymphocyte: 20.9 %
WBC: 7.5 10*3/uL (ref 3.8–10.8)

## 2021-11-30 LAB — LIPID PANEL
Cholesterol: 188 mg/dL (ref <200)
HDL: 52 mg/dL (ref >=40)
LDL Cholesterol (Calc): 104 mg/dL (calc) — ABNORMAL HIGH
Non-HDL Cholesterol (Calc): 136 mg/dL (calc) — ABNORMAL HIGH (ref <130)
Total CHOL/HDL Ratio: 3.6 (calc) (ref <5.0)
Triglycerides: 203 mg/dL — ABNORMAL HIGH (ref <150)

## 2021-11-30 LAB — URINALYSIS, ROUTINE W REFLEX MICROSCOPIC
Bilirubin Urine: NEGATIVE
Glucose, UA: NEGATIVE
Hgb urine dipstick: NEGATIVE
Ketones, ur: NEGATIVE
Leukocytes,Ua: NEGATIVE
Nitrite: NEGATIVE
Protein, ur: NEGATIVE
Specific Gravity, Urine: 1.011 (ref 1.001–1.035)
pH: 6 (ref 5.0–8.0)

## 2021-11-30 LAB — COMPLETE METABOLIC PANEL WITH GFR
AG Ratio: 2.2 (calc) (ref 1.0–2.5)
ALT: 35 U/L (ref 9–46)
AST: 27 U/L (ref 10–35)
Albumin: 4.6 g/dL (ref 3.6–5.1)
Alkaline phosphatase (APISO): 54 U/L (ref 35–144)
BUN: 18 mg/dL (ref 7–25)
CO2: 24 mmol/L (ref 20–32)
Calcium: 9.7 mg/dL (ref 8.6–10.3)
Chloride: 106 mmol/L (ref 98–110)
Creat: 1.1 mg/dL (ref 0.70–1.30)
Globulin: 2.1 g/dL (calc) (ref 1.9–3.7)
Glucose, Bld: 123 mg/dL — ABNORMAL HIGH (ref 65–99)
Potassium: 3.9 mmol/L (ref 3.5–5.3)
Sodium: 140 mmol/L (ref 135–146)
Total Bilirubin: 0.6 mg/dL (ref 0.2–1.2)
Total Protein: 6.7 g/dL (ref 6.1–8.1)
eGFR: 77 mL/min/{1.73_m2} (ref >=60)

## 2021-11-30 LAB — MICROALBUMIN / CREATININE URINE RATIO
Creatinine, Urine: 64 mg/dL (ref 20–320)
Microalb Creat Ratio: 5 mcg/mg creat (ref <30)
Microalb, Ur: 0.3 mg/dL

## 2021-11-30 LAB — VITAMIN B12: Vitamin B-12: 1573 pg/mL — ABNORMAL HIGH (ref 200–1100)

## 2021-11-30 LAB — IRON, TOTAL/TOTAL IRON BINDING CAP
%SAT: 31 % (calc) (ref 20–48)
Iron: 102 ug/dL (ref 50–180)
TIBC: 332 mcg/dL (calc) (ref 250–425)

## 2021-11-30 LAB — INSULIN, RANDOM: Insulin: 88 u[IU]/mL — ABNORMAL HIGH

## 2021-11-30 LAB — HEMOGLOBIN A1C
Hgb A1c MFr Bld: 6.1 % of total Hgb — ABNORMAL HIGH (ref <5.7)
Mean Plasma Glucose: 128 mg/dL
eAG (mmol/L): 7.1 mmol/L

## 2021-11-30 LAB — VITAMIN D 25 HYDROXY (VIT D DEFICIENCY, FRACTURES): Vit D, 25-Hydroxy: 74 ng/mL (ref 30–100)

## 2021-11-30 LAB — MAGNESIUM: Magnesium: 2 mg/dL (ref 1.5–2.5)

## 2021-11-30 LAB — TESTOSTERONE: Testosterone: 338 ng/dL (ref 250–827)

## 2021-11-30 LAB — TSH: TSH: 1.96 mIU/L (ref 0.40–4.50)

## 2021-11-30 LAB — PSA: PSA: 2.72 ng/mL (ref <=4.00)

## 2021-12-01 NOTE — Progress Notes (Signed)
<><><><><><><><><><><><><><><><><><><><><><><><><><><><><><><><><> <><><><><><><><><><><><><><><><><><><><><><><><><><><><><><><><><> - Test results slightly outside the reference range are not unusual. If there is anything important, I will review this with you,  otherwise it is considered normal test values.  If you have further questions,  please do not hesitate to contact me at the office or via My Chart.  <><><><><><><><><><><><><><><><><><><><><><><><><><><><><><><><><> <><><><><><><><><><><><><><><><><><><><><><><><><><><><><><><><><>  -  Vitamin B12 - is Normal  - Great  ! <><><><><><><><><><><><><><><><><><><><><><><><><><><><><><><><><> <><><><><><><><><><><><><><><><><><><><><><><><><><><><><><><><><>  - Total Chol = 188  Excellent   - Very low risk for Heart Attack  / Stroke <><><><><><><><><><><><><><><><><><><><><><><><><><><><><><><><><>  - Triglycerides (   203   ) or fats in blood are too high                 (   Ideal or  Goal is less than 150  !  )    - Recommend avoid fried & greasy foods,  sweets / candy,   - Avoid white rice  (brown or wild rice or Quinoa is OK),   - Avoid white potatoes  (sweet potatoes are OK)   - Avoid anything made from white flour  - bagels, doughnuts, rolls, buns, biscuits, white and   wheat breads, pizza crust and traditional  pasta made of white flour & egg white  - (vegetarian pasta or spinach or wheat pasta is OK).    - Multi-grain bread is OK - like multi-grain flat bread or  sandwich thins.   - Avoid alcohol in excess.   - Exercise is also important. <><><><><><><><><><><><><><><><><><><><><><><><><><><><><><><><><> <><><><><><><><><><><><><><><><><><><><><><><><><><><><><><><><><>  - A1c = 6.1% getting closer to "full blown" diabetes   - So You need a stricter diet & Weight Loss  Being diabetic has a  300% increased risk for heart attack,  stroke, cancer, and alzheimer- type vascular dementia.   It is very  important that you work harder with diet by  avoiding all foods that are white except chicken,   fish & calliflower.  - Avoid white rice  (brown & wild rice is OK),   - Avoid white potatoes  (sweet potatoes in moderation is OK),   White bread or wheat bread or anything made out of   white flour like bagels, donuts, rolls, buns, biscuits, cakes,  - pastries, cookies, pizza crust, and pasta (made from  white flour & egg whites)   - vegetarian pasta or spinach or wheat pasta is OK.  - Multigrain breads like Arnold's, Pepperidge Farm or   multigrain sandwich thins or high fiber breads like   [place] bread or "Dave's Killer" breads that are  4 to 5 grams fiber per slice !  are best.    Diet, exercise and weight loss can reverse and cure  diabetes in the early stages.    - Diet, exercise and weight loss is very important in the   control and prevention of complications of diabetes which  affects every system in your body, ie.   -Brain - dementia/stroke,  - eyes - glaucoma/blindness,  - heart - heart attack/heart failure,  - kidneys - dialysis,  - stomach - gastric paralysis,  - intestines - malabsorption,  - nerves - severe painful neuritis,  - circulation - gangrene & loss of a leg(s)  - and finally  . . . . . . . . . . . . . . . . . .    - cancer and Alzheimers.  <><><><><><><><><><><><><><><><><><><><><><><><><><><><><><><><><> <><><><><><><><><><><><><><><><><><><><><><><><><><><><><><><><><>  - All Else - CBC - Kidneys - Electrolytes - Liver - Magnesium & Thyroid    -  all  Normal / OK <><><><><><><><><><><><><><><><><><><><><><><><><><><><><><><><><> <><><><><><><><><><><><><><><><><><><><><><><><><><><><><><><><><>

## 2021-12-02 ENCOUNTER — Encounter: Payer: Self-pay | Admitting: Internal Medicine

## 2021-12-03 DIAGNOSIS — Z111 Encounter for screening for respiratory tuberculosis: Secondary | ICD-10-CM | POA: Diagnosis not present

## 2022-01-06 ENCOUNTER — Other Ambulatory Visit: Payer: Self-pay | Admitting: Internal Medicine

## 2022-01-06 ENCOUNTER — Other Ambulatory Visit (HOSPITAL_COMMUNITY): Payer: Self-pay

## 2022-01-06 DIAGNOSIS — I10 Essential (primary) hypertension: Secondary | ICD-10-CM

## 2022-01-06 MED ORDER — LOSARTAN POTASSIUM 50 MG PO TABS
50.0000 mg | ORAL_TABLET | Freq: Every day | ORAL | 3 refills | Status: DC
  Filled 2022-01-06: qty 90, 90d supply, fill #0
  Filled 2022-04-02: qty 90, 90d supply, fill #1
  Filled 2022-07-01: qty 90, 90d supply, fill #2
  Filled 2022-10-03: qty 90, 90d supply, fill #3

## 2022-01-07 ENCOUNTER — Other Ambulatory Visit (HOSPITAL_COMMUNITY): Payer: Self-pay

## 2022-01-15 ENCOUNTER — Other Ambulatory Visit: Payer: Self-pay | Admitting: Nurse Practitioner

## 2022-01-15 ENCOUNTER — Other Ambulatory Visit (HOSPITAL_COMMUNITY): Payer: Self-pay

## 2022-01-15 DIAGNOSIS — K219 Gastro-esophageal reflux disease without esophagitis: Secondary | ICD-10-CM

## 2022-01-15 MED ORDER — OMEPRAZOLE 40 MG PO CPDR
40.0000 mg | DELAYED_RELEASE_CAPSULE | Freq: Every day | ORAL | 3 refills | Status: DC
  Filled 2022-01-15: qty 90, 90d supply, fill #0
  Filled 2022-04-13: qty 90, 90d supply, fill #1
  Filled 2022-07-08: qty 90, 90d supply, fill #2
  Filled 2022-10-07: qty 90, 90d supply, fill #3

## 2022-02-01 ENCOUNTER — Other Ambulatory Visit: Payer: Self-pay

## 2022-02-01 DIAGNOSIS — Z1211 Encounter for screening for malignant neoplasm of colon: Secondary | ICD-10-CM

## 2022-02-01 LAB — POC HEMOCCULT BLD/STL (HOME/3-CARD/SCREEN)
Card #2 Fecal Occult Blod, POC: NEGATIVE
Card #3 Fecal Occult Blood, POC: NEGATIVE
Fecal Occult Blood, POC: NEGATIVE

## 2022-02-04 DIAGNOSIS — Z1211 Encounter for screening for malignant neoplasm of colon: Secondary | ICD-10-CM | POA: Diagnosis not present

## 2022-02-04 DIAGNOSIS — Z1212 Encounter for screening for malignant neoplasm of rectum: Secondary | ICD-10-CM | POA: Diagnosis not present

## 2022-03-05 ENCOUNTER — Encounter: Payer: Self-pay | Admitting: Nurse Practitioner

## 2022-03-05 ENCOUNTER — Ambulatory Visit: Payer: BC Managed Care – PPO | Admitting: Nurse Practitioner

## 2022-03-05 ENCOUNTER — Ambulatory Visit (INDEPENDENT_AMBULATORY_CARE_PROVIDER_SITE_OTHER): Payer: BC Managed Care – PPO | Admitting: Nurse Practitioner

## 2022-03-05 VITALS — BP 140/80 | HR 80 | Temp 97.7°F | Ht 69.0 in | Wt 253.8 lb

## 2022-03-05 DIAGNOSIS — M5136 Other intervertebral disc degeneration, lumbar region: Secondary | ICD-10-CM | POA: Diagnosis not present

## 2022-03-05 DIAGNOSIS — R7309 Other abnormal glucose: Secondary | ICD-10-CM

## 2022-03-05 DIAGNOSIS — N5082 Scrotal pain: Secondary | ICD-10-CM

## 2022-03-05 DIAGNOSIS — E782 Mixed hyperlipidemia: Secondary | ICD-10-CM

## 2022-03-05 DIAGNOSIS — K219 Gastro-esophageal reflux disease without esophagitis: Secondary | ICD-10-CM

## 2022-03-05 DIAGNOSIS — E559 Vitamin D deficiency, unspecified: Secondary | ICD-10-CM | POA: Diagnosis not present

## 2022-03-05 DIAGNOSIS — J309 Allergic rhinitis, unspecified: Secondary | ICD-10-CM

## 2022-03-05 DIAGNOSIS — K760 Fatty (change of) liver, not elsewhere classified: Secondary | ICD-10-CM

## 2022-03-05 DIAGNOSIS — I10 Essential (primary) hypertension: Secondary | ICD-10-CM | POA: Diagnosis not present

## 2022-03-05 DIAGNOSIS — I861 Scrotal varices: Secondary | ICD-10-CM

## 2022-03-05 DIAGNOSIS — Z79899 Other long term (current) drug therapy: Secondary | ICD-10-CM

## 2022-03-05 DIAGNOSIS — M25562 Pain in left knee: Secondary | ICD-10-CM

## 2022-03-05 NOTE — Patient Instructions (Signed)

## 2022-03-05 NOTE — Progress Notes (Signed)
FOLLOW UP  Assessment and Plan:   Hypertension Above goal today. Discussed DASH (Dietary Approaches to Stop Hypertension) DASH diet is lower in sodium than a typical American diet. Cut back on foods that are high in saturated fat, cholesterol, and trans fats. Eat more whole-grain foods, fish, poultry, and nuts Remain active and exercise as tolerated daily.  Monitor BP at home-Call if greater than 130/80.  Check CMP/CBC   Cholesterol Discussed lifestyle modifications. Recommended diet heavy in fruits and veggies, omega 3's. Decrease consumption of animal meats, cheeses, and dairy products. Remain active and exercise as tolerated. Continue to monitor. Check lipids/TSH   DDD, lumbar Intermittent Continue to rest when flared Wear support back brace Continue to monitor  Other abnormal glucose Education: Reviewed 'ABCs' of diabetes management  Discussed goals to be met and/or maintained include A1C (<7) Blood pressure (<130/80) Cholesterol (LDL <70) Continue Eye Exam yearly  Continue Dental Exam Q6 mo Discussed dietary recommendations Discussed Physical Activity recommendations Check A1C   Morbid Obesity with co morbidities Discussed appropriate BMI Diet modification. Physical activity. Encouraged/praised to build confidence.   Vitamin D Def Continue supplementation to maintain goal of 60-100 Monitor levels  Medication Management All medications discussed and reviewed in full. All questions and concerns regarding medications addressed.    Fatty Liver Monitor LFTs Avoid greasy, fatty foods, tylenol Continue to monitor  GERD No suspected reflux complications (Barret/stricture). Lifestyle modification:  wt loss, avoid meals 2-3h before bedtime. Consider eliminating food triggers:  chocolate, caffeine, EtOH, acid/spicy food.  Seasonal allergies Avoid triggers OTC antihistamines PRN  Medication management All medications discussed and reviewed in  full. All questions and concerns regarding medications addressed.    Scrotal pain/variocele Refer to Urology  Left knee pain Continue RICE Method Continue brace Left knee x-ray for further review and evalaution  Orders Placed This Encounter  Procedures   DG Knee Complete 4 Views Left    Standing Status:   Future    Standing Expiration Date:   03/06/2023    Order Specific Question:   Reason for Exam (SYMPTOM  OR DIAGNOSIS REQUIRED)    Answer:   Left knee pain, crepitus, pain along medial side, unable to place left foot on right knee, pain with pending    Order Specific Question:   Preferred imaging location?    Answer:   [id number]   CBC with Differential/Platelet   COMPLETE METABOLIC PANEL WITH GFR   Lipid panel   Hemoglobin A1c   VITAMIN D 25 Hydroxy (Vit-D Deficiency, Fractures)   Ambulatory referral to Urology    Referral Priority:   Routine    Referral Type:   Consultation    Referral Reason:   Specialty Services Required    Requested Specialty:   Urology    Number of Visits Requested:   1    Notify office for further evaluation and treatment, questions or concerns if any reported s/s fail to improve.   The patient was advised to call back or seek an in-person evaluation if any symptoms worsen or if the condition fails to improve as anticipated.   Further disposition pending results of labs. Discussed med's effects and SE's.    I discussed the assessment and treatment plan with the patient. The patient was provided an opportunity to ask questions and all were answered. The patient agreed with the plan and demonstrated an understanding of the instructions.  Discussed med's effects and SE's. Screening labs and tests as requested with regular follow-up as recommended.  I  provided 20 minutes of face-to-face time during this encounter including counseling, chart review, and critical decision making was preformed.   Future Appointments  Date Time Provider [place]  06/11/2022  4:00 PM Unk Pinto, MD [facility] None  12/04/2022  2:00 PM Unk Pinto, MD [facility] None    ----------------------------------------------------------------------------------------------------------------------  HPI 62 y.o. male  presents for 3 month follow up on hypertension, cholesterol, prediabetes, morbid obesity, GERD and vitamin D deficiency.   He shares that when he was around 62 yo he had his left testicle evaluated for pain, hardness, swelling and what he thought was "varicose veins."  He has not had any issues since then, but noticed over the last month the left testicle has been difficult to find, painful when sitting, swollen and hard.  However, over the last week the area has returned back to normal.    Also states increase in left knee pain. No known injury.  Pain is concentrated on the medial side.  He is wearing a copper brace which has provided more support and decreased the pain over the last week.  He is unable to bend at the knee without pain.   he has a diagnosis of GERD which is currently managed by omeprazole 40 mg daily in AM; failed attempt to taper with famotidine.   He continues to have back pain, worse with certain movements. Will occasionally take Ibuprofen but rarely.    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  Tried phentermine, couldn't tolerate even low dose 1/4 tab with urinary retention and constipation. He has read how not to die and has the cookbook  Wt Readings from Last 3 Encounters:  03/05/22 253 lb 12.8 oz (115.1 kg)  11/29/21 258 lb (117 kg)  08/15/21 243 lb (110.2 kg)   His blood pressure has been controlled at home (120-130/70-80s), today their BP is BP: (!) 140/80  BP Readings from Last 3 Encounters:  03/05/22 (!) 140/80  11/29/21 134/76  08/15/21 133/81   He does workout. He denies chest  pain, shortness of breath, dizziness.   He is not on cholesterol medication and denies myalgias. His cholesterol is not at goal. The cholesterol last visit was:   Lab Results  Component Value Date   CHOL 188 11/29/2021   HDL 52 11/29/2021   LDLCALC 104 (H) 11/29/2021   TRIG 203 (H) 11/29/2021   CHOLHDL 3.6 11/29/2021    He has not been working on diet and exercise for hx of prediabetes (5.8% 05/2018, 6.0% 2016), and denies foot ulcerations, hyperglycemia, increased appetite, nausea, paresthesia of the feet, polydipsia, polyuria, visual disturbances, vomiting and weight loss. Last A1C in the office was:  Lab Results  Component Value Date   HGBA1C 6.1 (H) 11/29/2021    Last GFR:  Lab Results  Component Value Date   GFRNONAA 84 05/01/2020   Patient is on Vitamin D supplement and at goal:    Lab Results  Component Value Date   VD25OH 74 11/29/2021        Current Medications:  Current Outpatient Medications on File Prior to Visit  Medication Sig   Ascorbic Acid (VITAMIN C) 1000 MG tablet Take 1 tablet Daily   Bioflavonoid Products (BIOFLEX PO) Take by mouth.   Cholecalciferol 5000 units capsule Take 5,000 Units by mouth daily.   fexofenadine (ALLEGRA) 180 MG tablet Take 180 mg by  mouth daily.    hydrocortisone-pramoxine (ANALPRAM-HC) 2.5-1 % rectal cream Place rectally 3 (three) times daily as needed for hemorrhoids or anal itching.   losartan (COZAAR) 50 MG tablet Take 1 tablet (50 mg total) by mouth daily for blood pressure   omeprazole (PRILOSEC) 40 MG capsule Take 1 capsule (40 mg total) by mouth daily for indigestion and heartburn.   OVER THE COUNTER MEDICATION TUMERIC   Probiotic Product (PROBIOTIC DAILY PO) Take by mouth daily.   Zinc 50 MG CAPS Take by mouth daily.   [DISCONTINUED] phentermine (ADIPEX-P) 37.5 MG tablet TAKE 1/2 TO 1 TABLET EVERY MORNING FOR DIETING & WEIGHTLOSS   No current facility-administered medications on file prior to visit.     Allergies:   Allergies  Allergen Reactions   Iodides Hives     Medical History:  Past Medical History:  Diagnosis Date   Allergic rhinitis    Anemia    unsure of this   Arthritis    Chest pain    GERD (gastroesophageal reflux disease)    GERD (gastroesophageal reflux disease) 08/01/2013   Hypertension    Lung nodule, solitary 11/11/2014   11/11/2014 nodule noted; follow up CT done 11/28/2015- nodule stable, no follow up recommended.    Obesity    Tendonitis    Vitamin B12 deficiency    Family history- Reviewed and unchanged Social history- Reviewed and unchanged   Review of Systems:  Review of Systems  Constitutional:  Negative for malaise/fatigue and weight loss.  HENT:  Negative for hearing loss and tinnitus.   Eyes:  Negative for blurred vision and double vision.  Respiratory:  Negative for cough, shortness of breath and wheezing.   Cardiovascular:  Negative for chest pain, palpitations, orthopnea, claudication and leg swelling.  Gastrointestinal:  Negative for abdominal pain, blood in stool, constipation, diarrhea, heartburn, melena, nausea and vomiting.  Genitourinary: Negative.   Musculoskeletal:  Positive for back pain. Negative for joint pain and myalgias.  Skin:  Negative for rash.  Neurological:  Negative for dizziness, tingling, sensory change, weakness and headaches.  Endo/Heme/Allergies:  Negative for polydipsia.  Psychiatric/Behavioral: Negative.    All other systems reviewed and are negative.    Physical Exam: BP (!) 140/80   Pulse 80   Temp 97.7 F (36.5 C)   Ht '5\' 9"'$  (1.753 m)   Wt 253 lb 12.8 oz (115.1 kg)   SpO2 99%   BMI 37.48 kg/m  Wt Readings from Last 3 Encounters:  03/05/22 253 lb 12.8 oz (115.1 kg)  11/29/21 258 lb (117 kg)  08/15/21 243 lb (110.2 kg)   General Appearance: Very pleasant , obese male, in no apparent distress. Eyes: PERRLA, EOMs, conjunctiva no swelling or erythema Sinuses: No Frontal/maxillary tenderness ENT/Mouth: Ext aud  canals clear, TMs without erythema, bulging. No erythema, swelling, or exudate on post pharynx.  Tonsils not swollen or erythematous. Hearing normal.  Neck: Supple, thyroid normal.  Respiratory: Respiratory effort normal, BS equal bilaterally without rales, rhonchi, wheezing or stridor.  Cardio: RRR with no MRGs. Brisk peripheral pulses without edema.  Abdomen: Soft, + BS.  Non tender, no guarding, rebound, hernias, masses. Lymphatics: Non tender without lymphadenopathy.  Musculoskeletal: LROM in left knee d/t pain.  Unable to place left foot on top of right knee without pain.  Crepitus present. 5/5 strength, Normal gait. Neg straight leg raise. No spinous tenderness.  Skin: Warm, dry without rashes, lesions, ecchymosis.  Neuro: Cranial nerves intact. No cerebellar symptoms.  Psych: Awake and oriented X  3, normal affect, Insight and Judgment appropriate.  Genitourinary: Defers - Request referral    Darrol Jump, NP 2:56 PM [facility]

## 2022-03-06 ENCOUNTER — Other Ambulatory Visit: Payer: Self-pay | Admitting: Nurse Practitioner

## 2022-03-06 DIAGNOSIS — N5082 Scrotal pain: Secondary | ICD-10-CM

## 2022-03-06 DIAGNOSIS — I861 Scrotal varices: Secondary | ICD-10-CM

## 2022-03-06 LAB — COMPLETE METABOLIC PANEL WITH GFR
AG Ratio: 2 (calc) (ref 1.0–2.5)
ALT: 27 U/L (ref 9–46)
AST: 21 U/L (ref 10–35)
Albumin: 4.4 g/dL (ref 3.6–5.1)
Alkaline phosphatase (APISO): 61 U/L (ref 35–144)
BUN: 13 mg/dL (ref 7–25)
CO2: 25 mmol/L (ref 20–32)
Calcium: 9.9 mg/dL (ref 8.6–10.3)
Chloride: 105 mmol/L (ref 98–110)
Creat: 1.01 mg/dL (ref 0.70–1.30)
Globulin: 2.2 g/dL (calc) (ref 1.9–3.7)
Glucose, Bld: 89 mg/dL (ref 65–99)
Potassium: 4.2 mmol/L (ref 3.5–5.3)
Sodium: 141 mmol/L (ref 135–146)
Total Bilirubin: 0.8 mg/dL (ref 0.2–1.2)
Total Protein: 6.6 g/dL (ref 6.1–8.1)
eGFR: 86 mL/min/{1.73_m2} (ref >=60)

## 2022-03-06 LAB — LIPID PANEL
Cholesterol: 161 mg/dL (ref <200)
HDL: 57 mg/dL (ref >=40)
LDL Cholesterol (Calc): 86 mg/dL (calc)
Non-HDL Cholesterol (Calc): 104 mg/dL (calc) (ref <130)
Total CHOL/HDL Ratio: 2.8 (calc) (ref <5.0)
Triglycerides: 85 mg/dL (ref <150)

## 2022-03-06 LAB — CBC WITH DIFFERENTIAL/PLATELET
Absolute Monocytes: 454 cells/uL (ref 200–950)
Basophils Absolute: 57 cells/uL (ref 0–200)
Basophils Relative: 0.8 %
Eosinophils Absolute: 121 cells/uL (ref 15–500)
Eosinophils Relative: 1.7 %
HCT: 42.3 % (ref 38.5–50.0)
Hemoglobin: 14.7 g/dL (ref 13.2–17.1)
Lymphs Abs: 1541 cells/uL (ref 850–3900)
MCH: 30.4 pg (ref 27.0–33.0)
MCHC: 34.8 g/dL (ref 32.0–36.0)
MCV: 87.6 fL (ref 80.0–100.0)
MPV: 11.2 fL (ref 7.5–12.5)
Monocytes Relative: 6.4 %
Neutro Abs: 4927 cells/uL (ref 1500–7800)
Neutrophils Relative %: 69.4 %
Platelets: 232 10*3/uL (ref 140–400)
RBC: 4.83 10*6/uL (ref 4.20–5.80)
RDW: 12.3 % (ref 11.0–15.0)
Total Lymphocyte: 21.7 %
WBC: 7.1 10*3/uL (ref 3.8–10.8)

## 2022-03-06 LAB — VITAMIN D 25 HYDROXY (VIT D DEFICIENCY, FRACTURES): Vit D, 25-Hydroxy: 75 ng/mL (ref 30–100)

## 2022-03-06 LAB — HEMOGLOBIN A1C
Hgb A1c MFr Bld: 6.2 % of total Hgb — ABNORMAL HIGH (ref <5.7)
Mean Plasma Glucose: 131 mg/dL
eAG (mmol/L): 7.3 mmol/L

## 2022-03-25 ENCOUNTER — Ambulatory Visit
Admission: RE | Admit: 2022-03-25 | Discharge: 2022-03-25 | Disposition: A | Discharge status: Home / Self Care | Payer: 59 | Source: Ambulatory Visit | Attending: Nurse Practitioner | Admitting: Nurse Practitioner

## 2022-03-25 DIAGNOSIS — I861 Scrotal varices: Secondary | ICD-10-CM

## 2022-03-25 DIAGNOSIS — N5082 Scrotal pain: Secondary | ICD-10-CM | POA: Diagnosis not present

## 2022-03-25 DIAGNOSIS — Z8679 Personal history of other diseases of the circulatory system: Secondary | ICD-10-CM | POA: Diagnosis not present

## 2022-03-25 DIAGNOSIS — N433 Hydrocele, unspecified: Secondary | ICD-10-CM | POA: Diagnosis not present

## 2022-03-26 ENCOUNTER — Encounter: Payer: Self-pay | Admitting: Nurse Practitioner

## 2022-03-26 DIAGNOSIS — R294 Clicking hip: Secondary | ICD-10-CM

## 2022-03-26 DIAGNOSIS — R262 Difficulty in walking, not elsewhere classified: Secondary | ICD-10-CM

## 2022-03-26 DIAGNOSIS — M25551 Pain in right hip: Secondary | ICD-10-CM

## 2022-04-03 ENCOUNTER — Other Ambulatory Visit: Payer: Self-pay

## 2022-04-13 ENCOUNTER — Other Ambulatory Visit (HOSPITAL_COMMUNITY): Payer: Self-pay

## 2022-04-15 DIAGNOSIS — N50812 Left testicular pain: Secondary | ICD-10-CM | POA: Diagnosis not present

## 2022-04-15 DIAGNOSIS — N430 Encysted hydrocele: Secondary | ICD-10-CM | POA: Diagnosis not present

## 2022-06-11 ENCOUNTER — Encounter: Payer: Self-pay | Admitting: Internal Medicine

## 2022-06-11 ENCOUNTER — Ambulatory Visit (INDEPENDENT_AMBULATORY_CARE_PROVIDER_SITE_OTHER): Payer: BC Managed Care – PPO | Admitting: Internal Medicine

## 2022-06-11 VITALS — BP 124/60 | HR 75 | Temp 97.5°F | Resp 16 | Ht 69.0 in | Wt 211.8 lb

## 2022-06-11 DIAGNOSIS — E782 Mixed hyperlipidemia: Secondary | ICD-10-CM

## 2022-06-11 DIAGNOSIS — R7309 Other abnormal glucose: Secondary | ICD-10-CM | POA: Diagnosis not present

## 2022-06-11 DIAGNOSIS — I10 Essential (primary) hypertension: Secondary | ICD-10-CM | POA: Diagnosis not present

## 2022-06-11 DIAGNOSIS — E559 Vitamin D deficiency, unspecified: Secondary | ICD-10-CM | POA: Diagnosis not present

## 2022-06-11 DIAGNOSIS — K219 Gastro-esophageal reflux disease without esophagitis: Secondary | ICD-10-CM

## 2022-06-11 DIAGNOSIS — Z79899 Other long term (current) drug therapy: Secondary | ICD-10-CM | POA: Diagnosis not present

## 2022-06-11 NOTE — Patient Instructions (Signed)

## 2022-06-11 NOTE — Progress Notes (Signed)
Future Appointments  Date Time Provider Department  06/11/2022                            6 mo  4:00 PM Lucky Cowboy, MD [facility]  12/04/2022                          cpe  2:00 PM Lucky Cowboy, MD [facility]    History of Present Illness:       This very nice 62 y.o.  MWM presents for 3 month follow up with HTN, HLD, Pre-Diabetes and Vitamin D Deficiency.  Note patient has lost 47# from 258# to 211.8# over the lat 6 months on a programat "[facility]".        Patient is treated for HTN (1996)   & BP has been controlled at home. Today's BP is at goal - 124/60. Patient has had no complaints of any cardiac type chest pain, palpitations, dyspnea / orthopnea / PND, dizziness, claudication, or dependent edema.        Hyperlipidemia is controlled with diet & meds. Patient denies myalgias or other med SE's. Last Lipids were  Lab Results  Component Value Date   CHOL 161 03/05/2022   HDL 57 03/05/2022   LDLCALC 86 03/05/2022   TRIG 85 03/05/2022   CHOLHDL 2.8 03/05/2022     Also, the patient has history of PreDiabetes and has had no symptoms of reactive hypoglycemia, diabetic polys, paresthesias or visual blurring.  Last A1c was  not at goal :  Lab Results  Component Value Date   HGBA1C 6.2 (H) 03/05/2022                                                          Further, the patient also has history of Vitamin D Deficiency  ("33" /2018)  and supplements vitamin D . Last vitamin D was at goal :  Lab Results  Component Value Date   VD25OH 10 03/05/2022     Current Outpatient Medications on File Prior to Visit  Medication Sig   VITAMIN C 1000 MG tab Take 1 tablet Daily   Bioflavonoid /BIOFLEX  Take    Cholecalciferol 5000 u Take  daily.   fexofenadine 180 MG tablet Take  daily.    ANALPRAM-HC rect crm Place rectally 3 (three) times daily as needed    losartan50 MG tablet Take 1 tablet  daily   omeprazole 40 MG capsule Take 1 capsule daily    OTC TUMERIC    Probiotic Product  Take daily.   Zinc 50 MG CAPS Take  daily.     Allergies  Allergen Reactions   Iodides Hives     PMHx:   Past Medical History:  Diagnosis Date   Allergic rhinitis    Anemia    unsure of this   Arthritis    Chest pain    GERD  08/01/2013   Hypertension    Lung nodule, solitary 11/11/2014   11/11/2014 nodule noted; f/u CT done 11/28/2015- nodule stable, no f/u recommended.    Obesity    Tendonitis    Vitamin B12 deficiency       Immunization History  Administered Date(s) Administered   PFIZER-SARS-COV-2 Vacc 10/06/2019,  10/27/2019   PPD Test 11/29/2020, 12/03/2021   Pneumococcal-23 02/19/1995   Tdap 10/07/2012      Past Surgical History:  Procedure Laterality Date   BACK SURGERY     x2, L4-L5   BUNIONECTOMY Left 02/14/2014   CERVICAL DISC SURGERY  2003   COLONOSCOPY     UPPER GASTROINTESTINAL ENDOSCOPY       FHx:    Reviewed / unchanged   SHx:    Reviewed / unchanged    Systems Review:  Constitutional: Denies fever, chills, wt changes, headaches, insomnia, fatigue, night sweats, change in appetite. Eyes: Denies redness, blurred vision, diplopia, discharge, itchy, watery eyes.  ENT: Denies discharge, congestion, post nasal drip, epistaxis, sore throat, earache, hearing loss, dental pain, tinnitus, vertigo, sinus pain, snoring.  CV: Denies chest pain, palpitations, irregular heartbeat, syncope, dyspnea, diaphoresis, orthopnea, PND, claudication or edema. Respiratory: denies cough, dyspnea, DOE, pleurisy, hoarseness, laryngitis, wheezing.  Gastrointestinal: Denies dysphagia, odynophagia, heartburn, reflux, water brash, abdominal pain or cramps, nausea, vomiting, bloating, diarrhea, constipation, hematemesis, melena, hematochezia  or hemorrhoids. Genitourinary: Denies dysuria, frequency, urgency, nocturia, hesitancy, discharge, hematuria or flank pain. Musculoskeletal: Denies arthralgias, myalgias, stiffness, jt. swelling, pain,  limping or strain/sprain.  Skin: Denies pruritus, rash, hives, warts, acne, eczema or change in skin lesion(s). Neuro: No weakness, tremor, incoordination, spasms, paresthesia or pain. Psychiatric: Denies confusion, memory loss or sensory loss. Endo: Denies change in weight, skin or hair change.  Heme/Lymph: No excessive bleeding, bruising or enlarged lymph nodes.   Physical Exam  BP 124/60   Pulse 75   Temp (!) 97.5 F (36.4 C)   Resp 16   Ht  (1.753 m)   Wt 211 lb 12.8 oz (96.1 kg)   SpO2 98%   BMI 31.28 kg/m   Appears  well nourished, well groomed  and in no distress.  Eyes: PERRLA, EOMs, conjunctiva no swelling or erythema. Sinuses: No frontal/maxillary tenderness ENT/Mouth: EAC's clear, TM's nl w/o erythema, bulging. Nares clear w/o erythema, swelling, exudates. Oropharynx clear without erythema or exudates. Oral hygiene is good. Tongue normal, non obstructing. Hearing intact.  Neck: Supple. Thyroid not palpable. Car 2+/2+ without bruits, nodes or JVD. Chest: Respirations nl with BS clear & equal w/o rales, rhonchi, wheezing or stridor.  Cor: Heart sounds normal w/ regular rate and rhythm without sig. murmurs, gallops, clicks or rubs. Peripheral pulses normal and equal  without edema.  Abdomen: Soft & bowel sounds normal. Non-tender w/o guarding, rebound, hernias, masses or organomegaly.  Lymphatics: Unremarkable.  Musculoskeletal: Full ROM all peripheral extremities, joint stability, 5/5 strength and normal gait.  Skin: Warm, dry without exposed rashes, lesions or ecchymosis apparent.  Neuro: Cranial nerves intact, reflexes equal bilaterally. Sensory-motor testing grossly intact. Tendon reflexes grossly intact.  Pysch: Alert & oriented x 3.  Insight and judgement nl & appropriate. No ideations.   Assessment and Plan:  1. Essential hypertension  - Continue medication, monitor blood pressure at home.  - Continue DASH diet.  Reminder to go to the ER if any CP,  SOB,  nausea, dizziness, severe HA, changes vision/speech.   - CBC with Differential/Platelet - COMPLETE METABOLIC PANEL WITH GFR - Magnesium - TSH  2. Hyperlipidemia, mixed  - Continue diet/meds, exercise,& lifestyle modifications.  - Continue monitor periodic cholesterol/liver & renal functions    - Lipid panel - TSH  3. Abnormal glucose  - Continue diet, exercise  - Lifestyle modifications.  - Monitor appropriate labs   - Hemoglobin A1c - Insulin, random  4.  Vitamin D deficiency  - Continue supplementation   - VITAMIN D 25 Hydroxy  5. Morbid obesity  - TSH  6. Gastroesophageal reflux disease  - CBC with Differential/Platelet  7. Medication management  - CBC with Differential/Platelet - COMPLETE METABOLIC PANEL WITH GFR - Magnesium - Lipid panel - TSH - Hemoglobin A1c - Insulin, random - VITAMIN D 25 Hydroxy          Discussed  regular exercise, BP monitoring, weight control to achieve/maintain BMI less than 25 and discussed med and SE's. Recommended labs to assess /monitor clinical status .  I discussed the assessment and treatment plan with the patient. The patient was provided an opportunity to ask questions and all were answered. The patient agreed with the plan and demonstrated an understanding of the instructions.  I provided over 30 minutes of exam, counseling, chart review and  complex critical decision making.        The patient was advised to call back or seek an in-person evaluation if the symptoms worsen or if the condition fails to improve as anticipated.   Marinus Maw, MD

## 2022-06-12 ENCOUNTER — Encounter: Payer: Self-pay | Admitting: Internal Medicine

## 2022-06-12 LAB — TSH: TSH: 2.06 mIU/L (ref 0.40–4.50)

## 2022-06-12 LAB — CBC WITH DIFFERENTIAL/PLATELET
Absolute Monocytes: 456 cells/uL (ref 200–950)
Basophils Absolute: 56 cells/uL (ref 0–200)
Basophils Relative: 0.7 %
Eosinophils Absolute: 96 cells/uL (ref 15–500)
Eosinophils Relative: 1.2 %
HCT: 41.4 % (ref 38.5–50.0)
Hemoglobin: 14.2 g/dL (ref 13.2–17.1)
Lymphs Abs: 1320 cells/uL (ref 850–3900)
MCH: 30.2 pg (ref 27.0–33.0)
MCHC: 34.3 g/dL (ref 32.0–36.0)
MCV: 88.1 fL (ref 80.0–100.0)
MPV: 11.7 fL (ref 7.5–12.5)
Monocytes Relative: 5.7 %
Neutro Abs: 6072 cells/uL (ref 1500–7800)
Neutrophils Relative %: 75.9 %
Platelets: 229 10*3/uL (ref 140–400)
RBC: 4.7 10*6/uL (ref 4.20–5.80)
RDW: 12.8 % (ref 11.0–15.0)
Total Lymphocyte: 16.5 %
WBC: 8 10*3/uL (ref 3.8–10.8)

## 2022-06-12 LAB — COMPLETE METABOLIC PANEL WITH GFR
AG Ratio: 2.2 (calc) (ref 1.0–2.5)
ALT: 11 U/L (ref 9–46)
AST: 15 U/L (ref 10–35)
Albumin: 4.2 g/dL (ref 3.6–5.1)
Alkaline phosphatase (APISO): 95 U/L (ref 35–144)
BUN: 15 mg/dL (ref 7–25)
CO2: 27 mmol/L (ref 20–32)
Calcium: 9.7 mg/dL (ref 8.6–10.3)
Chloride: 104 mmol/L (ref 98–110)
Creat: 0.89 mg/dL (ref 0.70–1.30)
Globulin: 1.9 g/dL (calc) (ref 1.9–3.7)
Glucose, Bld: 85 mg/dL (ref 65–99)
Potassium: 4.2 mmol/L (ref 3.5–5.3)
Sodium: 140 mmol/L (ref 135–146)
Total Bilirubin: 0.6 mg/dL (ref 0.2–1.2)
Total Protein: 6.1 g/dL (ref 6.1–8.1)
eGFR: 99 mL/min/{1.73_m2} (ref >=60)

## 2022-06-12 LAB — VITAMIN D 25 HYDROXY (VIT D DEFICIENCY, FRACTURES): Vit D, 25-Hydroxy: 110 ng/mL — ABNORMAL HIGH (ref 30–100)

## 2022-06-12 LAB — MAGNESIUM: Magnesium: 2 mg/dL (ref 1.5–2.5)

## 2022-06-12 LAB — LIPID PANEL
Cholesterol: 137 mg/dL (ref <200)
HDL: 43 mg/dL (ref >=40)
LDL Cholesterol (Calc): 76 mg/dL (calc)
Non-HDL Cholesterol (Calc): 94 mg/dL (calc) (ref <130)
Total CHOL/HDL Ratio: 3.2 (calc) (ref <5.0)
Triglycerides: 98 mg/dL (ref <150)

## 2022-06-12 LAB — HEMOGLOBIN A1C
Hgb A1c MFr Bld: 5.6 % of total Hgb (ref <5.7)
Mean Plasma Glucose: 114 mg/dL
eAG (mmol/L): 6.3 mmol/L

## 2022-06-12 LAB — INSULIN, RANDOM: Insulin: 7.6 u[IU]/mL

## 2022-06-12 NOTE — Progress Notes (Signed)
^<^<^<^<^<^<^<^<^<^<^<^<^<^<^<^<^<^<^<^<^<^<^<^<^<^<^<^<^<^<^<^<^<^<^<^<^ ^>^>^>^>^>^>^>^>^>^>^>>^>^>^>^>^>^>^>^>^>^>^>^>^>^>^>^>^>^>^>^>^>^>^>^>^ -Test results slightly outside the reference range are not unusual. If there is anything important, I will review this with you,  otherwise it is considered normal test values.  If you have further questions,  please do not hesitate to contact me at the office or via My Chart.  ^<^<^<^<^<^<^<^<^<^<^<^<^<^<^<^<^<^<^<^<^<^<^<^<^<^<^<^<^<^<^<^<^<^<^<^<^ ^>^>^>^>^>^>^>^>^>^>^>^>^>^>^>^>^>^>^>^>^>^>^>^>^>^>^>^>^>^>^>^>^>^>^>^>^  -Vitamin D = 110  slightly elevated  ( But OK up to about 115)   - Suggest cut back to 4,000 units /day or 5 caps  /week  ( leave off Sun 7 Thurs )    - Vitamin D goal is between 70-100.    - It is very important as a natural anti-inflammatory and helping the                                    immune system protect against viral infections, like the Covid-19    helping hair, skin, and nails, as well as reducing stroke and heart attack risk.   - It helps your bones and helps with mood.  - It also decreases numerous cancer risks   - Low Vit D is associated with a 200-300% higher risk for CANCER                                           & 200-300% higher risk for HEART   ATTACK  &  STROKE.                                            - It is also associated with higher death rate at younger ages,                  N   autoimmune diseases like Rheumatoid arthritis, Lupus, Multiple Noble  - Also many other serious conditions, like depression, Alzheimer's Dementia,                                                                                 muscle aches, fatigue, fibromyalgia   ^>^>^>^>^>^>^>^>^>^>^>^>^>^>^>^>^>^>^>^>^>^>^>^>^>^>^>^>^>^>^>^>^>^>^>^>^  - All Else - CBC - Kidneys - Electrolytes - Liver - Magnesium & Thyroid                                                                                                                - all  Normal / OK ^>^>^>^>^>^>^>^>^>^>^>^>^>^>^>^>^>^>^>^>^>^>^>^>^>^>^>^>^>^>^>^>^>^>^>^>^  - Keep up the [place] Work  !   &   - Thanks  for all of the Information that you shared with me about                                                                                    the "[facility]"  - Looks like it's working [place] for you !  - I'm still researching some of the ingredients  ^>^>^>^>^>^>^>^>^>^>^>^>^>^>^>^>^>^>^>^>^>^>^>^>^>^>^>^>^>^>^>^>^>^>^>^>^  - Have a great upcoming Birthday  !  ^>^>^>^>^>^>^>^>^>^>^>^>^>^>^>^>^>^>^>^>^>^>^>^>^>^>^>^>^>^>^>^>^>^>^>^>^

## 2022-07-01 ENCOUNTER — Other Ambulatory Visit (HOSPITAL_COMMUNITY): Payer: Self-pay

## 2022-07-08 ENCOUNTER — Other Ambulatory Visit: Payer: Self-pay

## 2022-09-16 ENCOUNTER — Ambulatory Visit (INDEPENDENT_AMBULATORY_CARE_PROVIDER_SITE_OTHER): Payer: BC Managed Care – PPO | Admitting: Nurse Practitioner

## 2022-09-16 ENCOUNTER — Encounter: Payer: Self-pay | Admitting: Nurse Practitioner

## 2022-09-16 VITALS — BP 134/78 | HR 63 | Temp 97.6°F | Ht 69.0 in | Wt 195.0 lb

## 2022-09-16 DIAGNOSIS — E782 Mixed hyperlipidemia: Secondary | ICD-10-CM

## 2022-09-16 DIAGNOSIS — J309 Allergic rhinitis, unspecified: Secondary | ICD-10-CM

## 2022-09-16 DIAGNOSIS — M5136 Other intervertebral disc degeneration, lumbar region: Secondary | ICD-10-CM

## 2022-09-16 DIAGNOSIS — E559 Vitamin D deficiency, unspecified: Secondary | ICD-10-CM | POA: Diagnosis not present

## 2022-09-16 DIAGNOSIS — I10 Essential (primary) hypertension: Secondary | ICD-10-CM

## 2022-09-16 DIAGNOSIS — K760 Fatty (change of) liver, not elsewhere classified: Secondary | ICD-10-CM

## 2022-09-16 DIAGNOSIS — R7309 Other abnormal glucose: Secondary | ICD-10-CM | POA: Diagnosis not present

## 2022-09-16 DIAGNOSIS — M25562 Pain in left knee: Secondary | ICD-10-CM

## 2022-09-16 DIAGNOSIS — Z79899 Other long term (current) drug therapy: Secondary | ICD-10-CM

## 2022-09-16 DIAGNOSIS — K219 Gastro-esophageal reflux disease without esophagitis: Secondary | ICD-10-CM

## 2022-09-16 LAB — CBC WITH DIFFERENTIAL/PLATELET
Absolute Monocytes: 390 cells/uL (ref 200–950)
Basophils Absolute: 52 cells/uL (ref 0–200)
Basophils Relative: 0.8 %
Eosinophils Absolute: 130 cells/uL (ref 15–500)
Eosinophils Relative: 2 %
HCT: 41.9 % (ref 38.5–50.0)
Hemoglobin: 13.9 g/dL (ref 13.2–17.1)
Lymphs Abs: 1216 cells/uL (ref 850–3900)
MCH: 30.5 pg (ref 27.0–33.0)
MCHC: 33.2 g/dL (ref 32.0–36.0)
MCV: 91.9 fL (ref 80.0–100.0)
MPV: 11.4 fL (ref 7.5–12.5)
Monocytes Relative: 6 %
Neutro Abs: 4713 cells/uL (ref 1500–7800)
Neutrophils Relative %: 72.5 %
Platelets: 209 10*3/uL (ref 140–400)
RBC: 4.56 10*6/uL (ref 4.20–5.80)
RDW: 12.9 % (ref 11.0–15.0)
Total Lymphocyte: 18.7 %
WBC: 6.5 10*3/uL (ref 3.8–10.8)

## 2022-09-16 NOTE — Progress Notes (Signed)
FOLLOW UP  Assessment and Plan:   Hypertension Controlled Discussed DASH (Dietary Approaches to Stop Hypertension) DASH diet is lower in sodium than a typical American diet. Cut back on foods that are high in saturated fat, cholesterol, and trans fats. Eat more whole-grain foods, fish, poultry, and nuts Remain active and exercise as tolerated daily.  Monitor BP at home-Call if greater than 130/80.  Check CMP/CBC   Cholesterol Discussed lifestyle modifications. Recommended diet heavy in fruits and veggies, omega 3's. Decrease consumption of animal meats, cheeses, and dairy products. Remain active and exercise as tolerated. Continue to monitor. Check lipids/TSH   DDD, lumbar Intermittent Continue to rest when flared Wear support back brace Continue to monitor  Other abnormal glucose Education: Reviewed 'ABCs' of diabetes management  Discussed goals to be met and/or maintained include A1C (<7) Blood pressure (<130/80) Cholesterol (LDL <70) Continue Eye Exam yearly  Continue Dental Exam Q6 mo Discussed dietary recommendations Discussed Physical Activity recommendations Check A1C   Morbid Obesity with co morbidities Discussed appropriate BMI Diet modification. Physical activity. Encouraged/praised to build confidence.   Vitamin D Def Continue supplementation to maintain goal of 60-100 Monitor levels  Medication Management All medications discussed and reviewed in full. All questions and concerns regarding medications addressed.    Fatty Liver Controlled Monitor LFTs Avoid greasy, fatty foods, tylenol Continue to monitor  GERD No suspected reflux complications (Barret/stricture). Lifestyle modification:  wt loss, avoid meals 2-3h before bedtime. Consider eliminating food triggers:  chocolate, caffeine, EtOH, acid/spicy food.  Seasonal allergies Avoid triggers OTC antihistamines PRN  Medication management All medications discussed and reviewed in  full. All questions and concerns regarding medications addressed.    Left knee pain Continue RICE Method Continue brace  Orders Placed This Encounter  Procedures   CBC with Differential/Platelet   COMPLETE METABOLIC PANEL WITH GFR   Lipid panel   VITAMIN D 25 Hydroxy (Vit-D Deficiency, Fractures)   Notify office for further evaluation and treatment, questions or concerns if any reported s/s fail to improve.   The patient was advised to call back or seek an in-person evaluation if any symptoms worsen or if the condition fails to improve as anticipated.   Further disposition pending results of labs. Discussed med's effects and SE's.    I discussed the assessment and treatment plan with the patient. The patient was provided an opportunity to ask questions and all were answered. The patient agreed with the plan and demonstrated an understanding of the instructions.  Discussed med's effects and SE's. Screening labs and tests as requested with regular follow-up as recommended.  I provided 20 minutes of face-to-face time during this encounter including counseling, chart review, and critical decision making was preformed.   Future Appointments  Date Time Provider Department Center  01/27/2023  3:00 PM Lucky Cowboy, MD [facility] None  05/19/2023  3:30 PM Adela Glimpse, NP [facility] None    ----------------------------------------------------------------------------------------------------------------------  HPI 62 y.o. male  presents for 3 month follow up on hypertension, cholesterol, prediabetes, morbid obesity, GERD and vitamin D deficiency.   Overall he reports feeling well today.  Continue to have right knee pain and right upper back pain.  Has followed with Orthopedics and Neurology in the past.  Controlled with stretching and OTC medications PRN.  No known injury.  Pain is concentrated on the medial side.  He has used a copper brace which has provided more support and  decreased the pain.   he has a diagnosis of GERD which is currently managed  by omeprazole 40 mg daily in AM; failed attempt to taper with famotidine.   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  Tried phentermine, could not tolerate d/t SE.   Wt Readings from Last 3 Encounters:  09/16/22 195 lb (88.5 kg)  06/11/22 211 lb 12.8 oz (96.1 kg)  03/05/22 253 lb 12.8 oz (115.1 kg)   His blood pressure has been controlled at home (120-130/70-80s), today their BP is BP: 134/78  BP Readings from Last 3 Encounters:  09/16/22 134/78  06/11/22 124/60  03/05/22 (!) 140/80   He does workout. He denies chest pain, shortness of breath, dizziness.   He is not on cholesterol medication and denies myalgias. His cholesterol is not at goal. The cholesterol last visit was:   Lab Results  Component Value Date   CHOL 137 06/11/2022   HDL 43 06/11/2022   LDLCALC 76 06/11/2022   TRIG 98 06/11/2022   CHOLHDL 3.2 06/11/2022    He has not been working on diet and exercise for hx of prediabetes (5.8% 05/2018, 6.0% 2016), and denies foot ulcerations, hyperglycemia, increased appetite, nausea, paresthesia of the feet, polydipsia, polyuria, visual disturbances, vomiting and weight loss. Last A1C in the office was:  Lab Results  Component Value Date   HGBA1C 5.6 06/11/2022    Last GFR:  Lab Results  Component Value Date   GFRNONAA 84 05/01/2020   Patient is on Vitamin D supplement and at goal:    Lab Results  Component Value Date   VD25OH 110 (H) 06/11/2022        Current Medications:  Current Outpatient Medications on File Prior to Visit  Medication Sig   Ascorbic Acid (VITAMIN C) 1000 MG tablet Take 1 tablet Daily   Bioflavonoid Products (BIOFLEX PO) Take by mouth.   Cholecalciferol 5000 units capsule Take 5,000 Units by mouth daily.   fexofenadine (ALLEGRA) 180 MG tablet Take 180 mg  by mouth daily.    hydrocortisone-pramoxine (ANALPRAM-HC) 2.5-1 % rectal cream Place rectally 3 (three) times daily as needed for hemorrhoids or anal itching.   losartan (COZAAR) 50 MG tablet Take 1 tablet (50 mg total) by mouth daily for blood pressure   Magnesium 400 MG TABS Take by mouth. Daily but sometime BID   omeprazole (PRILOSEC) 40 MG capsule Take 1 capsule (40 mg total) by mouth daily for indigestion and heartburn.   OVER THE COUNTER MEDICATION TUMERIC   Probiotic Product (PROBIOTIC DAILY PO) Take by mouth daily.   Zinc 50 MG CAPS Take by mouth daily.   [DISCONTINUED] phentermine (ADIPEX-P) 37.5 MG tablet TAKE 1/2 TO 1 TABLET EVERY MORNING FOR DIETING & WEIGHTLOSS   No current facility-administered medications on file prior to visit.     Allergies:  Allergies  Allergen Reactions   Iodides Hives     Medical History:  Past Medical History:  Diagnosis Date   Allergic rhinitis    Anemia    unsure of this   Arthritis    Chest pain    GERD (gastroesophageal reflux disease)    GERD (gastroesophageal reflux disease) 08/01/2013   Hypertension    Lung nodule, solitary 11/11/2014   11/11/2014 nodule noted; follow up CT done 11/28/2015- nodule stable, no follow up recommended.    Obesity    Tendonitis    Vitamin B12 deficiency    Family history- Reviewed and unchanged Social history-  Reviewed and unchanged   Review of Systems:  Review of Systems  Constitutional:  Negative for malaise/fatigue and weight loss.  HENT:  Negative for hearing loss and tinnitus.   Eyes:  Negative for blurred vision and double vision.  Respiratory:  Negative for cough, shortness of breath and wheezing.   Cardiovascular:  Negative for chest pain, palpitations, orthopnea, claudication and leg swelling.  Gastrointestinal:  Negative for abdominal pain, blood in stool, constipation, diarrhea, heartburn, melena, nausea and vomiting.  Genitourinary: Negative.   Musculoskeletal:  Positive for back  pain. Negative for joint pain and myalgias.  Skin:  Negative for rash.  Neurological:  Negative for dizziness, tingling, sensory change, weakness and headaches.  Endo/Heme/Allergies:  Negative for polydipsia.  Psychiatric/Behavioral: Negative.    All other systems reviewed and are negative.    Physical Exam: BP 134/78   Pulse 63   Temp 97.6 F (36.4 C)   Ht 5\' 9"  (1.753 m)   Wt 195 lb (88.5 kg)   SpO2 99%   BMI 28.80 kg/m  Wt Readings from Last 3 Encounters:  09/16/22 195 lb (88.5 kg)  06/11/22 211 lb 12.8 oz (96.1 kg)  03/05/22 253 lb 12.8 oz (115.1 kg)   General Appearance: Very pleasant , obese male, in no apparent distress. Eyes: PERRLA, EOMs, conjunctiva no swelling or erythema Sinuses: No Frontal/maxillary tenderness ENT/Mouth: Ext aud canals clear, TMs without erythema, bulging. No erythema, swelling, or exudate on post pharynx.  Tonsils not swollen or erythematous. Hearing normal.  Neck: Supple, thyroid normal.  Respiratory: Respiratory effort normal, BS equal bilaterally without rales, rhonchi, wheezing or stridor.  Cardio: RRR with no MRGs. Brisk peripheral pulses without edema.  Abdomen: Soft, + BS.  Non tender, no guarding, rebound, hernias, masses. Lymphatics: Non tender without lymphadenopathy.  Musculoskeletal: LROM in left knee d/t pain.  Unable to place left foot on top of right knee without pain.  Crepitus present. 5/5 strength, Normal gait. Neg straight leg raise. No spinous tenderness.  Skin: Warm, dry without rashes, lesions, ecchymosis.  Neuro: Cranial nerves intact. No cerebellar symptoms.  Psych: Awake and oriented X 3, normal affect, Insight and Judgment appropriate.  Genitourinary: Defers - Request referral    Adela Glimpse, NP 4:08 PM [facility]

## 2022-09-16 NOTE — Patient Instructions (Signed)

## 2022-09-21 ENCOUNTER — Encounter: Payer: Self-pay | Admitting: Nurse Practitioner

## 2022-10-03 ENCOUNTER — Other Ambulatory Visit: Payer: Self-pay

## 2022-10-03 ENCOUNTER — Other Ambulatory Visit (HOSPITAL_COMMUNITY): Payer: Self-pay

## 2022-10-07 ENCOUNTER — Other Ambulatory Visit (HOSPITAL_COMMUNITY): Payer: Self-pay

## 2022-11-06 DIAGNOSIS — L570 Actinic keratosis: Secondary | ICD-10-CM | POA: Diagnosis not present

## 2022-11-06 DIAGNOSIS — L814 Other melanin hyperpigmentation: Secondary | ICD-10-CM | POA: Diagnosis not present

## 2022-11-06 DIAGNOSIS — Z86018 Personal history of other benign neoplasm: Secondary | ICD-10-CM | POA: Diagnosis not present

## 2022-11-06 DIAGNOSIS — L821 Other seborrheic keratosis: Secondary | ICD-10-CM | POA: Diagnosis not present

## 2022-11-06 DIAGNOSIS — D225 Melanocytic nevi of trunk: Secondary | ICD-10-CM | POA: Diagnosis not present

## 2022-12-04 ENCOUNTER — Encounter: Payer: Self-pay | Admitting: Internal Medicine

## 2022-12-11 ENCOUNTER — Encounter: Payer: Self-pay | Admitting: Nurse Practitioner

## 2022-12-17 ENCOUNTER — Ambulatory Visit
Admission: RE | Admit: 2022-12-17 | Discharge: 2022-12-17 | Disposition: A | Discharge status: Home / Self Care | Payer: BC Managed Care – PPO | Source: Ambulatory Visit | Attending: Nurse Practitioner | Admitting: Nurse Practitioner

## 2022-12-17 DIAGNOSIS — M25562 Pain in left knee: Secondary | ICD-10-CM

## 2022-12-17 DIAGNOSIS — M1712 Unilateral primary osteoarthritis, left knee: Secondary | ICD-10-CM | POA: Diagnosis not present

## 2022-12-25 ENCOUNTER — Other Ambulatory Visit: Payer: Self-pay | Admitting: Nurse Practitioner

## 2022-12-25 ENCOUNTER — Other Ambulatory Visit: Payer: Self-pay | Admitting: Internal Medicine

## 2022-12-25 ENCOUNTER — Encounter: Payer: Self-pay | Admitting: Nurse Practitioner

## 2022-12-25 DIAGNOSIS — I10 Essential (primary) hypertension: Secondary | ICD-10-CM

## 2022-12-25 DIAGNOSIS — K219 Gastro-esophageal reflux disease without esophagitis: Secondary | ICD-10-CM

## 2022-12-26 ENCOUNTER — Other Ambulatory Visit (HOSPITAL_COMMUNITY): Payer: Self-pay

## 2022-12-26 ENCOUNTER — Other Ambulatory Visit: Payer: Self-pay

## 2022-12-26 MED ORDER — LOSARTAN POTASSIUM 50 MG PO TABS
50.0000 mg | ORAL_TABLET | Freq: Every day | ORAL | 3 refills | Status: DC
  Filled 2022-12-26: qty 90, 90d supply, fill #0
  Filled 2023-03-31: qty 90, 90d supply, fill #1

## 2022-12-26 MED ORDER — OMEPRAZOLE 40 MG PO CPDR
40.0000 mg | DELAYED_RELEASE_CAPSULE | Freq: Every day | ORAL | 3 refills | Status: DC
  Filled 2022-12-26: qty 90, 90d supply, fill #0
  Filled 2023-04-06: qty 90, 90d supply, fill #1

## 2022-12-30 ENCOUNTER — Encounter: Payer: Self-pay | Admitting: Nurse Practitioner

## 2022-12-31 ENCOUNTER — Other Ambulatory Visit: Payer: Self-pay

## 2022-12-31 ENCOUNTER — Other Ambulatory Visit (HOSPITAL_COMMUNITY): Payer: Self-pay

## 2022-12-31 MED ORDER — PREDNISONE 10 MG PO TABS
ORAL_TABLET | ORAL | 0 refills | Status: AC
  Filled 2022-12-31: qty 13, 7d supply, fill #0

## 2023-01-14 DIAGNOSIS — H25813 Combined forms of age-related cataract, bilateral: Secondary | ICD-10-CM | POA: Diagnosis not present

## 2023-01-14 DIAGNOSIS — H40013 Open angle with borderline findings, low risk, bilateral: Secondary | ICD-10-CM | POA: Diagnosis not present

## 2023-01-14 DIAGNOSIS — H179 Unspecified corneal scar and opacity: Secondary | ICD-10-CM | POA: Diagnosis not present

## 2023-01-14 DIAGNOSIS — H04123 Dry eye syndrome of bilateral lacrimal glands: Secondary | ICD-10-CM | POA: Diagnosis not present

## 2023-01-14 DIAGNOSIS — H524 Presbyopia: Secondary | ICD-10-CM | POA: Diagnosis not present

## 2023-01-26 ENCOUNTER — Encounter: Payer: Self-pay | Admitting: Internal Medicine

## 2023-01-26 NOTE — Progress Notes (Unsigned)
Albert Griffin      ADULT   &   ADOLESCENT      INTERNAL MEDICINE  Lucky Cowboy, M.D.          Rance Muir, ANP        Adela Glimpse, FNP  [facility] [address]  [place], [place]. [address] Telephone [phone] Telefax [phone]   Annual  Screening/Preventative Visit  & Comprehensive Evaluation & Examination   Future Appointments  Date Time Provider Department  01/27/2023                          cpe  3:00 PM Lucky Cowboy, MD [facility]  05/19/2023                          wellness  3:30 PM Adela Glimpse, NP [facility]  02/17/2024                        cpe  3:00 PM Lucky Cowboy, MD [facility]             This very nice 62 y.o. MWM  with  HTN, HLD, Prediabetes and Vitamin D Deficiency  presents for a Screening /Preventative Visit & comprehensive evaluation and management of multiple medical co-morbidities.          HTN predates since  1996. Patient's BP has been controlled at home.  Today's BP is at                   . Patient denies any cardiac symptoms as chest pain, palpitations, shortness of breath, dizziness or ankle swelling.        Patient's hyperlipidemia is controlled with diet and medications. Patient denies myalgias or other medication SE's. Last lipids were  at goal:  Lab Results  Component Value Date   CHOL 164 09/16/2022   HDL 60 09/16/2022   LDLCALC 88 09/16/2022   TRIG 71 09/16/2022   CHOLHDL 2.7 09/16/2022         Patient has hx/o prediabetes (5.8% 05/2018, 6.0% 2016)  and patient denies reactive hypoglycemic symptoms, visual blurring, diabetic polys or paresthesias. Last A1c was near goal:    Lab Results  Component Value Date   HGBA1C 5.6 06/11/2022         Finally, patient has history of Vitamin D Deficiency ("33" /2018) and last vitamin D was at goal :   Lab Results  Component Value Date   VD25OH 87 11/29/2020       Current Outpatient Medications  Medication Instructions   VITAMIN C 1000  MG tablet Take 1 tablet Daily   Bioflavonoid / BIOFLEX  Oral   Vitamin D 5,000 u Daily   fexofenadine 180 mg Daily   ANALPRAM-HC)2.5-1 % rec crm Rectal, 3 times daily PRN   losartan 50 MG tablet Take 1 tablet  daily    Magnesium 400 MG TABS Daily to  BID   omeprazole  40 MG capsule Take 1 capsule  daily   TUMERIC    Probiotic  Daily   Zinc 50 MG CAPS ODaily     Allergies  Allerge Reactions   Iodides Hives     Past Medical History:  Diagnosis Date   Allergic rhinitis    Anemia    Chest pain    GERD (gastroesophageal reflux disease)    GERD (gastroesophageal reflux disease) 08/01/2013   Hypertension  Lung nodule, solitary 11/11/2014   11/11/2014 nodule noted; follow up CT done 11/28/2015- nodule stable, no follow up recommended.    Obesity    Tendonitis    Vitamin B12 deficiency      Health Maintenance  Topic Date Due   Zoster Vaccines- Shingrix (1 of 2) Never done   COVID-19 Vaccine (3 - Pfizer risk series) 11/24/2019   INFLUENZA VACCINE  Never done   COLONOSCOPY  07/09/2021   TETANUS/TDAP  10/08/2022   Hepatitis C Screening  Completed   HIV Screening  Completed   HPV VACCINES  Aged Out     Immunization History  Administered Date(s) Administered   PFIZER SARS-COV-2 Vacc 10/06/2019, 10/27/2019   Pneumococcal-23 02/19/1995   Tdap 10/07/2012    Colon - 07/10/2011 - Dr Jarold Motto - Recc 10 year f/u due May 2023  Colon- 08/15/2021 - Dr Tiajuana Amass - Recc 7 year F / U due July 2030   Past Surgical History:  Procedure Laterality Date   BACK SURGERY     x2, L4-L5   BUNIONECTOMY Left 02/14/2014   CERVICAL DISC SURGERY  2003     Family History  Problem Relation Age of Onset   Lung cancer Mother    Breast cancer Mother    Dementia Mother    Esophageal cancer Father    Heart disease Sister    Melanoma Sister    Ovarian cancer Sister    COPD Sister    Heart disease Maternal Uncle    Brain cancer Paternal Uncle    Cancer Maternal Grandfather       Social History   Tobacco Use   Smoking status: Never   Smokeless tobacco: Former    Types: Chew    Quit date: 11/10/2000  Vaping Use   Vaping Use: Never used  Substance Use Topics   Alcohol use: Yes    Alcohol/week: 0.0 standard drinks    Comment: more during the summer and minimal after labor day   Drug use: No      ROS Constitutional: Denies fever, chills, weight loss/gain, headaches, insomnia,  night sweats or change in appetite. Does c/o fatigue. Eyes: Denies redness, blurred vision, diplopia, discharge, itchy or watery eyes.  ENT: Denies discharge, congestion, post nasal drip, epistaxis, sore throat, earache, hearing loss, dental pain, Tinnitus, Vertigo, Sinus pain or snoring.  Cardio: Denies chest pain, palpitations, irregular heartbeat, syncope, dyspnea, diaphoresis, orthopnea, PND, claudication or edema Respiratory: denies cough, dyspnea, DOE, pleurisy, hoarseness, laryngitis or wheezing.  Gastrointestinal: Denies dysphagia, heartburn, reflux, water brash, pain, cramps, nausea, vomiting, bloating, diarrhea, constipation, hematemesis, melena, hematochezia, jaundice or hemorrhoids Genitourinary: Denies dysuria, frequency, urgency, nocturia, hesitancy, discharge, hematuria or flank pain Musculoskeletal: Denies arthralgia, myalgia, stiffness, Jt. Swelling, pain, limp or strain/sprain. Denies Falls. Skin: Denies puritis, rash, hives, warts, acne, eczema or change in skin lesion Neuro: No weakness, tremor, incoordination, spasms, paresthesia or pain Psychiatric: Denies confusion, memory loss or sensory loss. Denies Depression. Endocrine: Denies change in weight, skin, hair change, nocturia, and paresthesia, diabetic polys, visual blurring or hyper / hypo glycemic episodes.  Heme/Lymph: No excessive bleeding, bruising or enlarged lymph nodes.   Physical Exam  There were no vitals taken for this visit.  General Appearance: over  nourished  and in no apparent  distress.  Eyes: PERRLA, EOMs, conjunctiva no swelling or erythema, normal fundi and vessels. Sinuses: No frontal/maxillary tenderness ENT/Mouth: EACs patent / TMs  nl. Nares clear without erythema, swelling, mucoid exudates. Oral hygiene is good. No erythema, swelling,  or exudate. Tongue normal, non-obstructing. Tonsils not swollen or erythematous. Hearing normal.  Neck: Supple, thyroid not palpable. No bruits, nodes or JVD. Respiratory: Respiratory effort normal.  BS equal and clear bilateral without rales, rhonci, wheezing or stridor. Cardio: Heart sounds are normal with regular rate and rhythm and no murmurs, rubs or gallops. Peripheral pulses are normal and equal bilaterally without edema. No aortic or femoral bruits. Chest: symmetric with normal excursions and percussion.  Abdomen: Soft, with Nl bowel sounds. Nontender, no guarding, rebound, hernias, masses, or organomegaly.  Lymphatics: Non tender without lymphadenopathy.  Musculoskeletal: Full ROM all peripheral extremities, joint stability, 5/5 strength, and normal gait. Skin: Warm and dry without rashes, lesions, cyanosis, clubbing or  ecchymosis.  Neuro: Cranial nerves intact, reflexes equal bilaterally. Normal muscle tone, no cerebellar symptoms. Sensation intact.  Pysch: Alert and oriented x 3 with normal affect, insight and judgment appropriate.   Assessment and Plan  1. Annual Preventative/Screening Exam   1. Encounter for routine adult health examination with abnormal findings   2. Essential hypertension  - EKG 12-Lead - [place], RETROPERITNL ABD,  LTD - Urinalysis, Routine w reflex microscopic - Microalbumin / creatinine urine ratio - CBC with Differential/Platelet - COMPLETE METABOLIC PANEL WITH GFR - Magnesium - TSH  3. Hyperlipidemia, mixed  - EKG 12-Lead - [place], RETROPERITNL ABD,  LTD - Lipid panel - TSH  4. Abnormal glucose  - EKG 12-Lead - [place], RETROPERITNL ABD,  LTD - Hemoglobin A1c - Insulin,  random  5. Vitamin D deficiency  - VITAMIN D 25 Hydroxy   6. Morbid obesity (HCC)  - TSH  7. Fatty liver   8. Screening-pulmonary TB  - TB Skin Test  9. Gastroesophageal reflux disease    10. Screening for prostate cancer  - PSA  11. Screening for heart disease  - EKG 12-Lead  12. FHx: heart disease  - EKG 12-Lead - [place], RETROPERITNL ABD,  LTD  13. Screening for AAA (aortic abdominal aneurysm)  - [place], RETROPERITNL ABD,  LTD  14. Fatigue  - Iron, Total/Total Iron Binding Cap - Vitamin B12 - Testosterone  15. Medication management  - Urinalysis, Routine w reflex microscopic - Microalbumin / creatinine urine ratio  16. Screening for colorectal cancer  - POC Hemoccult Bld/Stl            Patient was counseled in prudent diet, weight control to achieve/maintain BMI less than 25, BP monitoring, regular exercise and medications as discussed.  Discussed med effects and SE's. Routine screening labs and tests as requested with regular follow-up as recommended. Over 40 minutes of exam, counseling, chart review and high complex critical decision making was performed   Albert Maw, MD

## 2023-01-26 NOTE — Patient Instructions (Signed)

## 2023-01-27 ENCOUNTER — Ambulatory Visit (INDEPENDENT_AMBULATORY_CARE_PROVIDER_SITE_OTHER): Payer: BC Managed Care – PPO | Admitting: Internal Medicine

## 2023-01-27 ENCOUNTER — Encounter: Payer: Self-pay | Admitting: Internal Medicine

## 2023-01-27 VITALS — BP 112/60 | HR 77 | Temp 97.7°F | Resp 16 | Ht 69.0 in | Wt 190.6 lb

## 2023-01-27 DIAGNOSIS — Z0000 Encounter for general adult medical examination without abnormal findings: Secondary | ICD-10-CM

## 2023-01-27 DIAGNOSIS — Z136 Encounter for screening for cardiovascular disorders: Secondary | ICD-10-CM

## 2023-01-27 DIAGNOSIS — Z1389 Encounter for screening for other disorder: Secondary | ICD-10-CM

## 2023-01-27 DIAGNOSIS — I700 Atherosclerosis of aorta: Secondary | ICD-10-CM | POA: Diagnosis not present

## 2023-01-27 DIAGNOSIS — R5383 Other fatigue: Secondary | ICD-10-CM

## 2023-01-27 DIAGNOSIS — E559 Vitamin D deficiency, unspecified: Secondary | ICD-10-CM | POA: Diagnosis not present

## 2023-01-27 DIAGNOSIS — K760 Fatty (change of) liver, not elsewhere classified: Secondary | ICD-10-CM

## 2023-01-27 DIAGNOSIS — N401 Enlarged prostate with lower urinary tract symptoms: Secondary | ICD-10-CM | POA: Diagnosis not present

## 2023-01-27 DIAGNOSIS — Z111 Encounter for screening for respiratory tuberculosis: Secondary | ICD-10-CM | POA: Diagnosis not present

## 2023-01-27 DIAGNOSIS — R7309 Other abnormal glucose: Secondary | ICD-10-CM

## 2023-01-27 DIAGNOSIS — Z13220 Encounter for screening for lipoid disorders: Secondary | ICD-10-CM | POA: Diagnosis not present

## 2023-01-27 DIAGNOSIS — Z1329 Encounter for screening for other suspected endocrine disorder: Secondary | ICD-10-CM

## 2023-01-27 DIAGNOSIS — I10 Essential (primary) hypertension: Secondary | ICD-10-CM

## 2023-01-27 DIAGNOSIS — Z79899 Other long term (current) drug therapy: Secondary | ICD-10-CM | POA: Diagnosis not present

## 2023-01-27 DIAGNOSIS — Z8249 Family history of ischemic heart disease and other diseases of the circulatory system: Secondary | ICD-10-CM

## 2023-01-27 DIAGNOSIS — Z130 Encounter for screening for diseases of the blood and blood-forming organs and certain disorders involving the immune mechanism: Secondary | ICD-10-CM | POA: Diagnosis not present

## 2023-01-27 DIAGNOSIS — Z125 Encounter for screening for malignant neoplasm of prostate: Secondary | ICD-10-CM | POA: Diagnosis not present

## 2023-01-27 DIAGNOSIS — R350 Frequency of micturition: Secondary | ICD-10-CM

## 2023-01-27 DIAGNOSIS — Z1211 Encounter for screening for malignant neoplasm of colon: Secondary | ICD-10-CM

## 2023-01-27 DIAGNOSIS — Z131 Encounter for screening for diabetes mellitus: Secondary | ICD-10-CM

## 2023-01-27 DIAGNOSIS — Z0001 Encounter for general adult medical examination with abnormal findings: Secondary | ICD-10-CM

## 2023-01-27 DIAGNOSIS — E782 Mixed hyperlipidemia: Secondary | ICD-10-CM

## 2023-01-28 LAB — CBC WITH DIFFERENTIAL/PLATELET
Absolute Lymphocytes: 1467 {cells}/uL (ref 850–3900)
Absolute Monocytes: 464 {cells}/uL (ref 200–950)
Basophils Absolute: 41 {cells}/uL (ref 0–200)
Basophils Relative: 0.7 %
Eosinophils Absolute: 139 {cells}/uL (ref 15–500)
Eosinophils Relative: 2.4 %
HCT: 42.7 % (ref 38.5–50.0)
Hemoglobin: 14.3 g/dL (ref 13.2–17.1)
MCH: 31.2 pg (ref 27.0–33.0)
MCHC: 33.5 g/dL (ref 32.0–36.0)
MCV: 93 fL (ref 80.0–100.0)
MPV: 10.7 fL (ref 7.5–12.5)
Monocytes Relative: 8 %
Neutro Abs: 3689 {cells}/uL (ref 1500–7800)
Neutrophils Relative %: 63.6 %
Platelets: 209 10*3/uL (ref 140–400)
RBC: 4.59 10*6/uL (ref 4.20–5.80)
RDW: 12.3 % (ref 11.0–15.0)
Total Lymphocyte: 25.3 %
WBC: 5.8 10*3/uL (ref 3.8–10.8)

## 2023-01-28 LAB — URINALYSIS, ROUTINE W REFLEX MICROSCOPIC
Bilirubin Urine: NEGATIVE
Glucose, UA: NEGATIVE
Hgb urine dipstick: NEGATIVE
Ketones, ur: NEGATIVE
Leukocytes,Ua: NEGATIVE
Nitrite: NEGATIVE
Protein, ur: NEGATIVE
Specific Gravity, Urine: 1.006 (ref 1.001–1.035)
pH: 6.5 (ref 5.0–8.0)

## 2023-01-28 LAB — COMPLETE METABOLIC PANEL WITH GFR
AG Ratio: 2.7 (calc) — ABNORMAL HIGH (ref 1.0–2.5)
ALT: 16 U/L (ref 9–46)
AST: 19 U/L (ref 10–35)
Albumin: 4.6 g/dL (ref 3.6–5.1)
Alkaline phosphatase (APISO): 65 U/L (ref 35–144)
BUN: 13 mg/dL (ref 7–25)
CO2: 30 mmol/L (ref 20–32)
Calcium: 9.5 mg/dL (ref 8.6–10.3)
Chloride: 107 mmol/L (ref 98–110)
Creat: 1.01 mg/dL (ref 0.70–1.35)
Globulin: 1.7 g/dL — ABNORMAL LOW (ref 1.9–3.7)
Glucose, Bld: 84 mg/dL (ref 65–99)
Potassium: 4.2 mmol/L (ref 3.5–5.3)
Sodium: 142 mmol/L (ref 135–146)
Total Bilirubin: 0.8 mg/dL (ref 0.2–1.2)
Total Protein: 6.3 g/dL (ref 6.1–8.1)
eGFR: 85 mL/min/{1.73_m2} (ref >=60)

## 2023-01-28 LAB — PSA: PSA: 3.21 ng/mL (ref <=4.00)

## 2023-01-28 LAB — LIPID PANEL
Cholesterol: 166 mg/dL (ref <200)
HDL: 81 mg/dL (ref >=40)
LDL Cholesterol (Calc): 62 mg/dL
Non-HDL Cholesterol (Calc): 85 mg/dL (ref <130)
Total CHOL/HDL Ratio: 2 (calc) (ref <5.0)
Triglycerides: 149 mg/dL (ref <150)

## 2023-01-28 LAB — HEMOGLOBIN A1C
Hgb A1c MFr Bld: 5.3 %{Hb} (ref <5.7)
Mean Plasma Glucose: 105 mg/dL
eAG (mmol/L): 5.8 mmol/L

## 2023-01-28 LAB — IRON,TIBC AND FERRITIN PANEL
%SAT: 44 % (ref 20–48)
Ferritin: 54 ng/mL (ref 24–380)
Iron: 136 ug/dL (ref 50–180)
TIBC: 306 ug/dL (ref 250–425)

## 2023-01-28 LAB — VITAMIN B12: Vitamin B-12: 465 pg/mL (ref 200–1100)

## 2023-01-28 LAB — MICROALBUMIN / CREATININE URINE RATIO
Creatinine, Urine: 25 mg/dL (ref 20–320)
Microalb, Ur: <0.2 mg/dL

## 2023-01-28 LAB — TSH: TSH: 1.7 m[IU]/L (ref 0.40–4.50)

## 2023-01-28 LAB — INSULIN, RANDOM: Insulin: 11.8 u[IU]/mL

## 2023-01-28 LAB — TESTOSTERONE: Testosterone: 517 ng/dL (ref 250–827)

## 2023-01-28 LAB — MAGNESIUM: Magnesium: 1.9 mg/dL (ref 1.5–2.5)

## 2023-01-28 LAB — VITAMIN D 25 HYDROXY (VIT D DEFICIENCY, FRACTURES): Vit D, 25-Hydroxy: 74 ng/mL (ref 30–100)

## 2023-01-28 NOTE — Progress Notes (Signed)
[][][][][][][][][][][][][][][][][][][][][][][][][][][][][][][][][][][][][][][][][]][][][][][][][][][][][][][][][][][][][][][][][[][][][][] [][][][][][][][][][][][][][][][][][][][][][][][][][][][][][][][][][][][][][][][][]][][][][][][][][][][][][][][][][][][][][][][][[][][][][] -  Test results slightly outside the reference range are not unusual. If there is anything important, I will review this with you,  otherwise it is considered normal test values.  If you have further questions,  please do not hesitate to contact me at the office or via My Chart.  [] [] [] [] [] [] [] [] [] [] [] [] [] [] [] [] [] [] [] [] [] [] [] [] [] [] [] [] [] [] [] [] [] [] [] [] [] [] [] [] [] ][] [] [] [] [] [] [] [] [] [] [] [] [] [] [] [] [] [] [] [] [] [] [[] [] [] [] []   - Vitamin B12 is Normal   [] [] [] [] [] [] [] [] [] [] [] [] [] [] [] [] [] [] [] [] [] [] [] [] [] [] [] [] [] [] [] [] [] [] [] [] [] [] [] [] [] ][] [] [] [] [] [] [] [] [] [] [] [] [] [] [] [] [] [] [] [] [] [] [[] [] [] [] []   -  PSA - Normal & OK   [] [] [] [] [] [] [] [] [] [] [] [] [] [] [] [] [] [] [] [] [] [] [] [] [] [] [] [] [] [] [] [] [] [] [] [] [] [] [] [] [] ][] [] [] [] [] [] [] [] [] [] [] [] [] [] [] [] [] [] [] [] [] [] [[] [] [] [] []   -  Testosterone - Normal   [] [] [] [] [] [] [] [] [] [] [] [] [] [] [] [] [] [] [] [] [] [] [] [] [] [] [] [] [] [] [] [] [] [] [] [] [] [] [] [] [] ][] [] [] [] [] [] [] [] [] [] [] [] [] [] [] [] [] [] [] [] [] [] [[] [] [] [] []   -  A1c  - Normal - No Diabetes - Great !  [] [] [] [] [] [] [] [] [] [] [] [] [] [] [] [] [] [] [] [] [] [] [] [] [] [] [] [] [] [] [] [] [] [] [] [] [] [] [] [] [] ][] [] [] [] [] [] [] [] [] [] [] [] [] [] [] [] [] [] [] [] [] [] [[] [] [] [] []   -  Vitamin D = 74  - Excellent - Please  keep dosage same   [] [] [] [] [] [] [] [] [] [] [] [] [] [] [] [] [] [] [] [] [] [] [] [] [] [] [] [] [] [] [] [] [] [] [] [] [] [] [] [] [] ][] [] [] [] [] [] [] [] [] [] [] [] [] [] [] [] [] [] [] [] [] [] [[] [] [] [] []   -  Keep up the [place] Work   !   [] [] [] [] [] [] [] [] [] [] [] [] [] [] [] [] [] [] [] [] [] [] [] [] [] [] [] [] [] [] [] [] [] [] [] [] [] [] [] [] [] ][] [] [] [] [] [] [] [] [] [] [] [] [] [] [] [] [] [] [] [] [] [] [[] [] [] [] []

## 2023-02-03 ENCOUNTER — Other Ambulatory Visit (HOSPITAL_BASED_OUTPATIENT_CLINIC_OR_DEPARTMENT_OTHER): Payer: Self-pay

## 2023-02-26 ENCOUNTER — Other Ambulatory Visit: Payer: Self-pay

## 2023-02-26 DIAGNOSIS — Z1212 Encounter for screening for malignant neoplasm of rectum: Secondary | ICD-10-CM

## 2023-02-26 DIAGNOSIS — Z1211 Encounter for screening for malignant neoplasm of colon: Secondary | ICD-10-CM

## 2023-02-26 LAB — POC HEMOCCULT BLD/STL (HOME/3-CARD/SCREEN)
Card #2 Fecal Occult Blod, POC: NEGATIVE
Card #3 Fecal Occult Blood, POC: NEGATIVE
Fecal Occult Blood, POC: NEGATIVE

## 2023-03-31 ENCOUNTER — Other Ambulatory Visit (HOSPITAL_COMMUNITY): Payer: Self-pay

## 2023-04-07 ENCOUNTER — Other Ambulatory Visit: Payer: Self-pay

## 2023-05-19 ENCOUNTER — Ambulatory Visit: Payer: BC Managed Care – PPO | Admitting: Nurse Practitioner

## 2023-06-19 ENCOUNTER — Encounter: Payer: Self-pay | Admitting: Family Medicine

## 2023-06-19 ENCOUNTER — Ambulatory Visit: Admitting: Family Medicine

## 2023-06-19 ENCOUNTER — Other Ambulatory Visit (HOSPITAL_COMMUNITY): Payer: Self-pay

## 2023-06-19 VITALS — BP 120/72 | HR 66 | Temp 98.0°F | Ht 69.0 in | Wt 190.0 lb

## 2023-06-19 DIAGNOSIS — Z125 Encounter for screening for malignant neoplasm of prostate: Secondary | ICD-10-CM | POA: Diagnosis not present

## 2023-06-19 DIAGNOSIS — I10 Essential (primary) hypertension: Secondary | ICD-10-CM

## 2023-06-19 DIAGNOSIS — K219 Gastro-esophageal reflux disease without esophagitis: Secondary | ICD-10-CM

## 2023-06-19 DIAGNOSIS — E663 Overweight: Secondary | ICD-10-CM

## 2023-06-19 DIAGNOSIS — Z79899 Other long term (current) drug therapy: Secondary | ICD-10-CM | POA: Diagnosis not present

## 2023-06-19 DIAGNOSIS — K760 Fatty (change of) liver, not elsewhere classified: Secondary | ICD-10-CM

## 2023-06-19 DIAGNOSIS — E782 Mixed hyperlipidemia: Secondary | ICD-10-CM | POA: Diagnosis not present

## 2023-06-19 DIAGNOSIS — E559 Vitamin D deficiency, unspecified: Secondary | ICD-10-CM | POA: Diagnosis not present

## 2023-06-19 DIAGNOSIS — J309 Allergic rhinitis, unspecified: Secondary | ICD-10-CM

## 2023-06-19 DIAGNOSIS — Z6828 Body mass index (BMI) 28.0-28.9, adult: Secondary | ICD-10-CM | POA: Insufficient documentation

## 2023-06-19 DIAGNOSIS — M2042 Other hammer toe(s) (acquired), left foot: Secondary | ICD-10-CM

## 2023-06-19 LAB — COMPREHENSIVE METABOLIC PANEL WITH GFR
ALT: 17 U/L (ref 0–53)
AST: 19 U/L (ref 0–37)
Albumin: 4.6 g/dL (ref 3.5–5.2)
Alkaline Phosphatase: 58 U/L (ref 39–117)
BUN: 22 mg/dL (ref 6–23)
CO2: 25 meq/L (ref 19–32)
Calcium: 9.5 mg/dL (ref 8.4–10.5)
Chloride: 104 meq/L (ref 96–112)
Creatinine, Ser: 0.91 mg/dL (ref 0.40–1.50)
GFR: 91.29 mL/min (ref >60.00)
Glucose, Bld: 107 mg/dL — ABNORMAL HIGH (ref 70–99)
Potassium: 4.6 meq/L (ref 3.5–5.1)
Sodium: 140 meq/L (ref 135–145)
Total Bilirubin: 1.3 mg/dL — ABNORMAL HIGH (ref 0.2–1.2)
Total Protein: 6.7 g/dL (ref 6.0–8.3)

## 2023-06-19 LAB — CBC WITH DIFFERENTIAL/PLATELET
Basophils Absolute: 0.1 10*3/uL (ref 0.0–0.1)
Basophils Relative: 1.3 % (ref 0.0–3.0)
Eosinophils Absolute: 0.2 10*3/uL (ref 0.0–0.7)
Eosinophils Relative: 4 % (ref 0.0–5.0)
HCT: 45.3 % (ref 39.0–52.0)
Hemoglobin: 15.3 g/dL (ref 13.0–17.0)
Lymphocytes Relative: 22.3 % (ref 12.0–46.0)
Lymphs Abs: 1.2 10*3/uL (ref 0.7–4.0)
MCHC: 33.8 g/dL (ref 30.0–36.0)
MCV: 93.7 fl (ref 78.0–100.0)
Monocytes Absolute: 0.4 10*3/uL (ref 0.1–1.0)
Monocytes Relative: 7.9 % (ref 3.0–12.0)
Neutro Abs: 3.4 10*3/uL (ref 1.4–7.7)
Neutrophils Relative %: 64.5 % (ref 43.0–77.0)
Platelets: 176 10*3/uL (ref 150.0–400.0)
RBC: 4.84 Mil/uL (ref 4.22–5.81)
RDW: 13.6 % (ref 11.5–15.5)
WBC: 5.2 10*3/uL (ref 4.0–10.5)

## 2023-06-19 LAB — LIPID PANEL
Cholesterol: 191 mg/dL (ref 0–200)
HDL: 88.4 mg/dL (ref >39.00)
LDL Cholesterol: 94 mg/dL (ref 0–99)
NonHDL: 102.73
Total CHOL/HDL Ratio: 2
Triglycerides: 43 mg/dL (ref 0.0–149.0)
VLDL: 8.6 mg/dL (ref 0.0–40.0)

## 2023-06-19 LAB — PSA: PSA: 3.9 ng/mL (ref 0.10–4.00)

## 2023-06-19 LAB — HEMOGLOBIN A1C: Hgb A1c MFr Bld: 5.5 % (ref 4.6–6.5)

## 2023-06-19 LAB — VITAMIN D 25 HYDROXY (VIT D DEFICIENCY, FRACTURES): VITD: 72.81 ng/mL (ref 30.00–100.00)

## 2023-06-19 MED ORDER — LOSARTAN POTASSIUM 50 MG PO TABS
50.0000 mg | ORAL_TABLET | Freq: Every day | ORAL | 3 refills | Status: DC
  Filled 2023-06-19: qty 90, 90d supply, fill #0
  Filled 2023-09-29: qty 90, 90d supply, fill #1

## 2023-06-19 MED ORDER — OMEPRAZOLE 40 MG PO CPDR
40.0000 mg | DELAYED_RELEASE_CAPSULE | Freq: Every day | ORAL | 3 refills | Status: DC
  Filled 2023-06-19: qty 90, 90d supply, fill #0
  Filled 2023-09-29: qty 90, 90d supply, fill #1

## 2023-06-19 NOTE — Assessment & Plan Note (Signed)
 Checking lipids today.

## 2023-06-19 NOTE — Assessment & Plan Note (Signed)
 Referral to podiatry today

## 2023-06-19 NOTE — Assessment & Plan Note (Signed)
 Controlled with Flonase, continue

## 2023-06-19 NOTE — Assessment & Plan Note (Signed)
 Continue current medication regimen, will dose adjust based on lab results as needed

## 2023-06-19 NOTE — Assessment & Plan Note (Signed)
 Stable, continue efforts in healthy diet and activity level

## 2023-06-19 NOTE — Patient Instructions (Addendum)
 Welcome to [facility]!  Thank you for choosing us  for your Primary Care needs.   We offer in person and video appointments for your convenience. You may call our office to schedule appointments, or you may schedule appointments with me through MyChart.   The best way to get in contact with me is via MyChart message. This will get to me faster than a phone call, unless there is an emergency, then please call 911.  The lab is located downstairs in the Sports Medicine building, we also have xray available there.   We are checking labs today, will be in contact with any results that require further attention  I have given pelvic floor exercise handouts to help with urinary urgency.

## 2023-06-19 NOTE — Progress Notes (Signed)
 New Patient Office Visit  Subjective    Patient ID: Albert Griffin, male    DOB: 1961/02/10  Age: 63 y.o. MRN: [id number]  CC:  Chief Complaint  Patient presents with   Establish Care    No concerns, just discuss overall health    HPI Albert Griffin presents to establish care today. He will be due for a physical after December 2024.   Up to date on routine screenings.  Receives regular dental and eye care.  Reports eating well, sleeping well, feeling well overall.  States he will need refills on blood pressure medication and reflux medication about 2 to 3 weeks. He is fasting today. Reports compliance with medication regimen.  Reports hammertoes to the left foot, has seen podiatry in the past, would like referral to see them again. Denies other concerns today. Medical history as outlined below.  Outpatient Encounter Medications as of 06/19/2023  Medication Sig   Ascorbic Acid (VITAMIN C ) 1000 MG tablet Take 1 tablet Daily   Bioflavonoid Products (BIOFLEX PO) Take by mouth.   Cholecalciferol 5000 units capsule Take 5,000 Units by mouth daily.   fexofenadine (ALLEGRA) 180 MG tablet Take 180 mg by mouth daily.    hydrocortisone -pramoxine (ANALPRAM -HC) 2.5-1 % rectal cream Place rectally 3 (three) times daily as needed for hemorrhoids or anal itching.   Magnesium  400 MG TABS Take by mouth. Daily but sometime BID   OVER THE COUNTER MEDICATION TUMERIC   Probiotic Product (PROBIOTIC DAILY PO) Take by mouth daily.   Zinc 50 MG CAPS Take by mouth daily.   [DISCONTINUED] losartan  (COZAAR ) 50 MG tablet Take 1 tablet (50 mg total) by mouth daily for blood pressure   [DISCONTINUED] omeprazole  (PRILOSEC) 40 MG capsule Take 1 capsule (40 mg total) by mouth daily for indigestion and heartburn.   losartan  (COZAAR ) 50 MG tablet Take 1 tablet (50 mg total) by mouth daily for blood pressure   omeprazole  (PRILOSEC) 40 MG capsule Take 1 capsule (40 mg total) by mouth daily for indigestion and  heartburn.   [DISCONTINUED] phentermine  (ADIPEX-P ) 37.5 MG tablet TAKE 1/2 TO 1 TABLET EVERY MORNING FOR DIETING & WEIGHTLOSS   No facility-administered encounter medications on file as of 06/19/2023.    Past Medical History:  Diagnosis Date   Allergic rhinitis    Anemia    unsure of this   Arthritis    Chest pain    GERD (gastroesophageal reflux disease)    GERD (gastroesophageal reflux disease) 08/01/2013   Hypertension    Lung nodule, solitary 11/11/2014   11/11/2014 nodule noted; follow up CT done 11/28/2015- nodule stable, no follow up recommended.    Obesity    Tendonitis    Vitamin B12 deficiency     Past Surgical History:  Procedure Laterality Date   BACK SURGERY     x2, L4-L5   BUNIONECTOMY Left 02/14/2014   CERVICAL DISC SURGERY  2003   COLONOSCOPY     EYE SURGERY  1994   Still fragments cut out   SPINE SURGERY  2010/2005/2003   C6-7, L-5, L-4   UPPER GASTROINTESTINAL ENDOSCOPY      Family History  Problem Relation Age of Onset   Lung cancer Mother    Breast cancer Mother    Dementia Mother    Cancer Mother    Esophageal cancer Father    Heart disease Sister    Melanoma Sister    Ovarian cancer Sister    COPD Sister    Heart disease Maternal  Uncle    Brain cancer Paternal Uncle    Arthritis Maternal Grandmother    Cancer Maternal Grandfather    Diabetes Paternal Aunt    Colon cancer Neg Hx    Rectal cancer Neg Hx    Stomach cancer Neg Hx     Social History   Socioeconomic History   Marital status: Married    Spouse name: Not on file   Number of children: 2   Years of education: Not on file   Highest education level: Associate degree: occupational, Scientist, product/process development, or vocational program  Occupational History    Employer: [employer]  Tobacco Use   Smoking status: Never   Smokeless tobacco: Former    Types: Chew    Quit date: 11/10/2000  Vaping Use   Vaping status: Never Used  Substance and Sexual Activity   Alcohol use: Yes    Alcohol/week: 4.0  - 6.0 standard drinks of alcohol    Types: 2 - 3 Cans of beer, 2 - 3 Shots of liquor per week    Comment: more during the summer and minimal after labor day   Drug use: No   Sexual activity: Not Currently    Partners: Female    Birth control/protection: None  Other Topics Concern   Not on file  Social History Narrative   Not on file   Social Drivers of Health   Financial Resource Strain: Low Risk  (06/15/2023)   Overall Financial Resource Strain (CARDIA)    Difficulty of Paying Living Expenses: Not very hard  Food Insecurity: No Food Insecurity (06/15/2023)   Hunger Vital Sign    Worried About Running Out of Food in the Last Year: Never true    Ran Out of Food in the Last Year: Never true  Transportation Needs: No Transportation Needs (06/15/2023)   PRAPARE - Administrator, Civil Service (Medical): No    Lack of Transportation (Non-Medical): No  Physical Activity: Insufficiently Active (06/15/2023)   Exercise Vital Sign    Days of Exercise per Week: 5 days    Minutes of Exercise per Session: 20 min  Stress: No Stress Concern Present (06/15/2023)   [employer] of Occupational Health - Occupational Stress Questionnaire    Feeling of Stress : Not at all  Social Connections: Moderately Isolated (06/15/2023)   Social Connection and Isolation Panel [NHANES]    Frequency of Communication with Friends and Family: Never    Frequency of Social Gatherings with Friends and Family: Never    Attends Religious Services: More than 4 times per year    Active Member of [employer] or Organizations: No    Attends Engineer, structural: Not on file    Marital Status: Married  Catering manager Violence: Not on file    ROS Per HPI      Objective    BP 120/72 (BP Location: Left Arm, Patient Position: Sitting)   Pulse 66   Temp 98 F (36.7 C) (Temporal)   Ht 5\' 9"  (1.753 m)   Wt 190 lb (86.2 kg)   SpO2 96%   BMI 28.06 kg/m   Physical Exam Vitals and nursing note  reviewed.  Constitutional:      General: He is not in acute distress.    Appearance: Normal appearance.  HENT:     Head: Normocephalic and atraumatic.     Right Ear: External ear normal.     Left Ear: External ear normal.     Nose: Nose normal.  Mouth/Throat:     Mouth: Mucous membranes are moist.     Pharynx: Oropharynx is clear.  Eyes:     Extraocular Movements: Extraocular movements intact.  Cardiovascular:     Rate and Rhythm: Normal rate and regular rhythm.     Pulses: Normal pulses.     Heart sounds: Normal heart sounds.  Pulmonary:     Effort: Pulmonary effort is normal. No respiratory distress.     Breath sounds: Normal breath sounds. No wheezing, rhonchi or rales.  Musculoskeletal:        General: Normal range of motion.     Cervical back: Normal range of motion.     Right lower leg: No edema.     Left lower leg: No edema.  Lymphadenopathy:     Cervical: No cervical adenopathy.  Skin:    General: Skin is warm and dry.  Neurological:     General: No focal deficit present.     Mental Status: He is alert and oriented to person, place, and time.  Psychiatric:        Mood and Affect: Mood normal.        Behavior: Behavior normal.       Assessment & Plan:   Primary hypertension Assessment & Plan: Controlled, refilled losartan  today, checking GFR  Orders: -     CBC with Differential/Platelet -     Comprehensive metabolic panel with GFR -     Losartan  Potassium; Take 1 tablet (50 mg total) by mouth daily for blood pressure  Dispense: 90 tablet; Refill: 3  Allergic rhinitis, unspecified seasonality, unspecified trigger Assessment & Plan: Controlled with Flonase, continue  Orders: -     CBC with Differential/Platelet  Gastroesophageal reflux disease without esophagitis Assessment & Plan: Controlled with omeprazole , refilled, continue  Orders: -     CBC with Differential/Platelet -     Omeprazole ; Take 1 capsule (40 mg total) by mouth daily for  indigestion and heartburn.  Dispense: 90 capsule; Refill: 3  Fatty liver Assessment & Plan: Continue efforts in low-fat diet healthy activity level, checking liver enzymes today  Orders: -     Comprehensive metabolic panel with GFR  Vitamin D  deficiency Assessment & Plan: Checking vitamin D  level today  Orders: -     VITAMIN D  25 Hydroxy (Vit-D Deficiency, Fractures)  Mixed hyperlipidemia Assessment & Plan: Checking lipids today  Orders: -     Lipid panel  Medication management Assessment & Plan: Continue current medication regimen, will dose adjust based on lab results as needed  Orders: -     CBC with Differential/Platelet -     Comprehensive metabolic panel with GFR -     Hemoglobin A1c -     Lipid panel -     VITAMIN D  25 Hydroxy (Vit-D Deficiency, Fractures)  Prostate cancer screening Assessment & Plan: Checking PSA today, pelvic floor exercises given  Orders: -     PSA  Hammer toe of left foot Assessment & Plan: Referral to podiatry today  Orders: -     Ambulatory referral to Podiatry  Overweight with body mass index (BMI) of 28 to 28.9 in adult Assessment & Plan: Stable, continue efforts in healthy diet and activity level      Return in about 6 months (around 12/20/2023) for meds/labs.   Wellington Half, FNP

## 2023-06-19 NOTE — Assessment & Plan Note (Signed)
 Checking PSA today, pelvic floor exercises given

## 2023-06-19 NOTE — Assessment & Plan Note (Signed)
 Continue efforts in low-fat diet healthy activity level, checking liver enzymes today

## 2023-06-19 NOTE — Assessment & Plan Note (Signed)
 Controlled, refilled losartan  today, checking GFR

## 2023-06-19 NOTE — Assessment & Plan Note (Signed)
 Controlled with omeprazole , refilled, continue

## 2023-06-19 NOTE — Assessment & Plan Note (Signed)
 Checking vitamin D level today.

## 2023-06-24 ENCOUNTER — Encounter: Payer: Self-pay | Admitting: Family Medicine

## 2023-07-24 ENCOUNTER — Ambulatory Visit: Admitting: Family Medicine

## 2023-07-24 ENCOUNTER — Ambulatory Visit (INDEPENDENT_AMBULATORY_CARE_PROVIDER_SITE_OTHER)

## 2023-07-24 ENCOUNTER — Encounter: Payer: Self-pay | Admitting: Family Medicine

## 2023-07-24 VITALS — BP 116/70 | HR 63 | Temp 97.8°F | Ht 69.0 in | Wt 203.0 lb

## 2023-07-24 DIAGNOSIS — M25551 Pain in right hip: Secondary | ICD-10-CM | POA: Diagnosis not present

## 2023-07-24 DIAGNOSIS — M25512 Pain in left shoulder: Secondary | ICD-10-CM | POA: Diagnosis not present

## 2023-07-24 DIAGNOSIS — M5441 Lumbago with sciatica, right side: Secondary | ICD-10-CM | POA: Diagnosis not present

## 2023-07-24 NOTE — Assessment & Plan Note (Signed)
 X-ray lumbar spine Continue ibuprofen as needed Will refer as needed after x-ray

## 2023-07-24 NOTE — Assessment & Plan Note (Signed)
 May continue ibuprofen as needed May continue muscle rubs as needed Follow-up if symptoms persist

## 2023-07-24 NOTE — Assessment & Plan Note (Signed)
 X-ray right hip Continue ibuprofen as needed and I will refer as needed after x-ray

## 2023-07-24 NOTE — Progress Notes (Signed)
 Acute Office Visit  Subjective:     Patient ID: Albert Griffin, male    DOB: [birth year], 63 y.o.   MRN: [id number]  Chief Complaint  Patient presents with   Joint Pain    Left shoulder pain x3 weeks, picked up something heavy that was too heavy triggering it Right hip pain since Tuesday after standing up and hip popped "Feels like nerve pain"  Thinks may have bulge disk, hx of spine surgery. Lower back does not hurt    HPI Patient is in today for evaluation of left shoulder pain and right hip pain. States about 2 weeks ago he pulled his left deltoid, states he feels like this is healing well, has good range of motion.  States he just needs to strengthen it back up. Reports right hip pain that radiates down to the right ankle.  Has had similar pain in the past with bulging disc at L5 and S1.  States that he stood up from a seated position and his hip popped, he did not think anything of it and went about his afternoon.  States that his hip popped again in the evening, then he woke up the next morning with pain. He has taken ibuprofen today with some relief. Denies any change in bowel or bladder habits, denies other concerns today.  ROS Per HPI      Objective:    BP 116/70 (BP Location: Left Arm, Patient Position: Sitting)   Pulse 63   Temp 97.8 F (36.6 C) (Temporal)   Ht 5\' 9"  (1.753 m)   Wt 203 lb (92.1 kg)   SpO2 98%   BMI 29.98 kg/m    Physical Exam Vitals and nursing note reviewed.  Constitutional:      General: He is not in acute distress.    Appearance: Normal appearance.  HENT:     Head: Normocephalic and atraumatic.     Right Ear: External ear normal.     Left Ear: External ear normal.     Nose: Nose normal.     Mouth/Throat:     Mouth: Mucous membranes are moist.     Pharynx: Oropharynx is clear.  Eyes:     Extraocular Movements: Extraocular movements intact.  Cardiovascular:     Rate and Rhythm: Normal rate and regular rhythm.     Pulses: Normal  pulses.     Heart sounds: Normal heart sounds.  Pulmonary:     Effort: Pulmonary effort is normal. No respiratory distress.     Breath sounds: Normal breath sounds. No wheezing, rhonchi or rales.  Musculoskeletal:        General: Tenderness present.     Cervical back: Normal range of motion.     Right lower leg: No edema.     Left lower leg: No edema.     Comments: Limping to favor R hip LROM to right hip with adduction and abduction Mild tenderness to posterior left deltoid No erythema, no bruising, no obvious deformity at either the shoulder or the hip  Lymphadenopathy:     Cervical: No cervical adenopathy.  Skin:    General: Skin is warm and dry.  Neurological:     General: No focal deficit present.     Mental Status: He is alert and oriented to person, place, and time.  Psychiatric:        Mood and Affect: Mood normal.        Behavior: Behavior normal.     No results found for any  visits on 07/24/23.      Assessment & Plan:   Acute right-sided low back pain with right-sided sciatica Assessment & Plan: X-ray lumbar spine Continue ibuprofen as needed Will refer as needed after x-ray  Orders: -     DG Lumbar Spine 2-3 Views; Future  Right hip pain Assessment & Plan: X-ray right hip Continue ibuprofen as needed and I will refer as needed after x-ray  Orders: -     DG HIP UNILAT W OR W/O PELVIS 2-3 VIEWS RIGHT; Future  Acute pain of left shoulder Assessment & Plan: May continue ibuprofen as needed May continue muscle rubs as needed Follow-up if symptoms persist      No orders of the defined types were placed in this encounter.   Return if symptoms worsen or fail to improve.  Wellington Half, FNP

## 2023-07-24 NOTE — Patient Instructions (Signed)
 May continue ibuprofen as needed.  We are getting an xray today. We will be in contact with any abnormal results that require further attention.  We will refer you as needed after we get your x-rays back

## 2023-07-25 ENCOUNTER — Ambulatory Visit: Payer: Self-pay | Admitting: Family Medicine

## 2023-08-19 ENCOUNTER — Ambulatory Visit: Payer: BC Managed Care – PPO | Admitting: Internal Medicine

## 2023-09-29 ENCOUNTER — Other Ambulatory Visit (HOSPITAL_COMMUNITY): Payer: Self-pay

## 2023-11-19 ENCOUNTER — Ambulatory Visit: Payer: BC Managed Care – PPO | Admitting: Nurse Practitioner

## 2023-12-22 ENCOUNTER — Ambulatory Visit: Admitting: Family Medicine

## 2023-12-22 ENCOUNTER — Other Ambulatory Visit (HOSPITAL_COMMUNITY): Payer: Self-pay

## 2023-12-22 ENCOUNTER — Other Ambulatory Visit: Payer: Self-pay

## 2023-12-22 ENCOUNTER — Encounter: Payer: Self-pay | Admitting: Family Medicine

## 2023-12-22 VITALS — BP 118/70 | HR 65 | Temp 98.1°F | Ht 69.0 in | Wt 204.0 lb

## 2023-12-22 DIAGNOSIS — M2550 Pain in unspecified joint: Secondary | ICD-10-CM | POA: Diagnosis not present

## 2023-12-22 DIAGNOSIS — Z79899 Other long term (current) drug therapy: Secondary | ICD-10-CM

## 2023-12-22 DIAGNOSIS — I10 Essential (primary) hypertension: Secondary | ICD-10-CM

## 2023-12-22 DIAGNOSIS — G8929 Other chronic pain: Secondary | ICD-10-CM

## 2023-12-22 DIAGNOSIS — E559 Vitamin D deficiency, unspecified: Secondary | ICD-10-CM

## 2023-12-22 DIAGNOSIS — M25511 Pain in right shoulder: Secondary | ICD-10-CM | POA: Diagnosis not present

## 2023-12-22 DIAGNOSIS — M25512 Pain in left shoulder: Secondary | ICD-10-CM

## 2023-12-22 DIAGNOSIS — K219 Gastro-esophageal reflux disease without esophagitis: Secondary | ICD-10-CM

## 2023-12-22 DIAGNOSIS — J301 Allergic rhinitis due to pollen: Secondary | ICD-10-CM

## 2023-12-22 MED ORDER — LOSARTAN POTASSIUM 50 MG PO TABS
50.0000 mg | ORAL_TABLET | Freq: Every day | ORAL | 3 refills | Status: AC
  Filled 2023-12-22: qty 90, 90d supply, fill #0
  Filled 2024-03-23: qty 90, 90d supply, fill #1

## 2023-12-22 MED ORDER — OMEPRAZOLE 40 MG PO CPDR
40.0000 mg | DELAYED_RELEASE_CAPSULE | Freq: Every day | ORAL | 3 refills | Status: AC
  Filled 2023-12-22: qty 90, 90d supply, fill #0
  Filled 2024-03-23: qty 90, 90d supply, fill #1

## 2023-12-22 MED ORDER — HYDROCORT-PRAMOXINE (PERIANAL) 2.5-1 % EX CREA
TOPICAL_CREAM | Freq: Three times a day (TID) | CUTANEOUS | 1 refills | Status: AC | PRN
  Filled 2023-12-22: qty 90, 30d supply, fill #0

## 2023-12-22 NOTE — Patient Instructions (Addendum)
 We are checking labs today, will be in contact with any results that require further attention  Refills sent to pharmacy today.   Referral has been sent to sports medicine for you today.   I have ordered labs for you today. Please come by between 8am-4:30pm for your labs.   Follow-up with me in 6 mos for your physical, sooner if needed.

## 2023-12-22 NOTE — Progress Notes (Unsigned)
 Established Patient Office Visit  Subjective:     Patient ID: Albert Griffin, male    DOB: 09-04-1960, 63 y.o.   MRN: [id number]  No chief complaint on file.   HPI  Discussed the use of AI scribe software for clinical note transcription with the patient, who gave verbal consent to proceed.  History of Present Illness Albert Griffin is a 63 year old male who presents with persistent shoulder pain.  Shoulder pain - Persistent pain localized to the deltoid region and anterior shoulder, most severe at previous injection sites - Onset in March, with ongoing symptoms - Wearing a brace since last Wednesday has provided some relief and increased mobility - Pain intensity is less severe than prior to brace use - No prior consultation with sports medicine specialists for this issue - Previous shoulder x-rays performed  Generalized arthralgia - Joint pain involving elbows, wrists, knees, and lower back - No pain in hips - Uses Bioflex (contains turmeric) for joint pain, with uncertain effectiveness - Recently resumed drinking mushroom coffee for joint pain and inflammation - Considering additional over-the-counter options for inflammation  Allergic symptoms - Uses Allegra 180 mg and Flonase for allergy management - Occasional lack of efficacy with Allegra  Topical corticosteroid use - Occasional use of cortisone rectal cream, last used in the summer  History of back surgery - History of prior back surgeries - Prefers non-surgical interventions unless necessary     ROS Per HPI      Objective:    BP 118/70 (BP Location: Left Arm, Patient Position: Sitting)   Pulse 65   Temp 98.1 F (36.7 C) (Temporal)   Ht 5' 9 (1.753 m)   Wt 204 lb (92.5 kg)   SpO2 96%   BMI 30.13 kg/m    Physical Exam Vitals and nursing note reviewed.  Constitutional:      General: He is not in acute distress.    Appearance: Normal appearance.  HENT:     Head: Normocephalic and atraumatic.      Right Ear: External ear normal.     Left Ear: External ear normal.     Nose: Nose normal.     Mouth/Throat:     Mouth: Mucous membranes are moist.     Pharynx: Oropharynx is clear.  Eyes:     Extraocular Movements: Extraocular movements intact.  Cardiovascular:     Rate and Rhythm: Normal rate and regular rhythm.     Pulses: Normal pulses.     Heart sounds: Normal heart sounds.  Pulmonary:     Effort: Pulmonary effort is normal. No respiratory distress.     Breath sounds: Normal breath sounds. No wheezing, rhonchi or rales.  Musculoskeletal:     Cervical back: Normal range of motion.     Right lower leg: No edema.     Left lower leg: No edema.     Comments: Limited ROM to both shoulders, L shoulder brace in place  Lymphadenopathy:     Cervical: No cervical adenopathy.  Skin:    General: Skin is warm and dry.  Neurological:     General: No focal deficit present.     Mental Status: He is alert and oriented to person, place, and time.  Psychiatric:        Mood and Affect: Mood normal.        Behavior: Behavior normal.     No results found for any visits on 12/22/23.  The 10-year ASCVD risk score (Arnett DK, et al.,  2019) is: 6.1%  BP Readings from Last 3 Encounters:  12/22/23 118/70  07/24/23 116/70  06/19/23 120/72   Wt Readings from Last 3 Encounters:  12/22/23 204 lb (92.5 kg)  07/24/23 203 lb (92.1 kg)  06/19/23 190 lb (86.2 kg)      Last CBC Lab Results  Component Value Date   WBC 5.2 06/19/2023   HGB 15.3 06/19/2023   HCT 45.3 06/19/2023   MCV 93.7 06/19/2023   MCH 31.2 01/27/2023   RDW 13.6 06/19/2023   PLT 176.0 06/19/2023   Last metabolic panel Lab Results  Component Value Date   GLUCOSE 107 (H) 06/19/2023   NA 140 06/19/2023   K 4.6 06/19/2023   CL 104 06/19/2023   CO2 25 06/19/2023   BUN 22 06/19/2023   CREATININE 0.91 06/19/2023   GFR 91.29 06/19/2023   CALCIUM 9.5 06/19/2023   PHOS 2.5 06/01/2011   PROT 6.7 06/19/2023   ALBUMIN  4.6 06/19/2023   BILITOT 1.3 (H) 06/19/2023   ALKPHOS 58 06/19/2023   AST 19 06/19/2023   ALT 17 06/19/2023   Last lipids Lab Results  Component Value Date   CHOL 191 06/19/2023   HDL 88.40 06/19/2023   LDLCALC 94 06/19/2023   TRIG 43.0 06/19/2023   CHOLHDL 2 06/19/2023   Last hemoglobin A1c Lab Results  Component Value Date   HGBA1C 5.5 06/19/2023   Last thyroid  functions Lab Results  Component Value Date   TSH 1.70 01/27/2023   Last vitamin D  Lab Results  Component Value Date   VD25OH 72.81 06/19/2023   Last vitamin B12 and Folate Lab Results  Component Value Date   VITAMINB12 465 01/27/2023   FOLATE 21.6 07/05/2011         Assessment & Plan:   Assessment and Plan Assessment & Plan Left and right shoulder pain Chronic shoulder pain with recent exacerbation, particularly in the left shoulder. Pain localized to the deltoid area, described as sore, similar to a bruise. Persistent since March with increased severity. - Referred to sports medicine for further evaluation and possible ultrasound to assess for muscle tears. - Continue using shoulder brace for support.  Right-sided low back pain with sciatica Chronic right-sided low back pain with sciatica, ongoing since March. Persistent pain affecting daily activities.  Generalized joint pain Severe, persistent joint pain affecting elbows, wrists, knees, and lower back, impacting quality of life. - Continue current pain management strategies.  Essential hypertension Hypertension managed with current medication regimen.  Allergic rhinitis due to pollen Chronic allergic rhinitis managed with Allegra and Flonase. Reports variable effectiveness. - Continue current allergy management with Allegra and Flonase.  General Health Maintenance Routine health maintenance discussed, including regular eye exams and insurance coverage. - Scheduled eye exam on November 24th. - Ensured insurance coverage is up to  date.     Orders Placed This Encounter  Procedures   Comprehensive metabolic panel with GFR    Release to patient:   Immediate [1]   CBC with Differential/Platelet    Release to patient:   Immediate [1]   VITAMIN D  25 Hydroxy (Vit-D Deficiency, Fractures)   Vitamin B12   TSH   Microalbumin / creatinine urine ratio    Release to patient:   Immediate   Ambulatory referral to Sports Medicine    Referral Priority:   Routine    Referral Type:   Consultation    Number of Visits Requested:   1     Meds ordered this encounter  Medications  losartan  (COZAAR ) 50 MG tablet    Sig: Take 1 tablet (50 mg total) by mouth daily for blood pressure    Dispense:  90 tablet    Refill:  3   omeprazole  (PRILOSEC) 40 MG capsule    Sig: Take 1 capsule (40 mg total) by mouth daily for indigestion and heartburn.    Dispense:  90 capsule    Refill:  3   hydrocortisone -pramoxine (ANALPRAM -HC) 2.5-1 % rectal cream    Sig: Place rectally 3 (three) times daily as needed for hemorrhoids or anal itching.    Dispense:  90 g    Refill:  1    Return in about 6 months (around 06/20/2024) for cpe.  Corean LITTIE Ku, FNP

## 2023-12-23 ENCOUNTER — Other Ambulatory Visit: Payer: Self-pay

## 2023-12-24 ENCOUNTER — Other Ambulatory Visit: Payer: Self-pay

## 2023-12-24 ENCOUNTER — Other Ambulatory Visit (HOSPITAL_COMMUNITY): Payer: Self-pay

## 2023-12-31 NOTE — Progress Notes (Signed)
 [facility] [address] [place] [address] Phone: [phone] Subjective:   Albert Griffin, am serving as a scribe for Dr. Arthea Griffin.  I'm seeing this patient by the request  of:  Albert Corean CROME, FNP  CC: bilateral shoulder pain   YEP:Dlagzrupcz  Albert Griffin is a 63 y.o. male coming in with complaint of B shoulder pain. Patient states March strained L shoulder most pain posterior especially with extension. R hurts because of over use. At one point had radiating pain numbness and tingling. Different ROM make it sharp in nature.      Past Medical History:  Diagnosis Date   Allergic rhinitis    Anemia    unsure of this   Arthritis    Chest pain    GERD (gastroesophageal reflux disease)    GERD (gastroesophageal reflux disease) 08/01/2013   Hypertension    Lung nodule, solitary 11/11/2014   11/11/2014 nodule noted; follow up CT done 11/28/2015- nodule stable, no follow up recommended.    Obesity    Tendonitis    Vitamin B12 deficiency    Past Surgical History:  Procedure Laterality Date   BACK SURGERY     x2, L4-L5   BUNIONECTOMY Left 02/14/2014   CERVICAL DISC SURGERY  2003   COLONOSCOPY     EYE SURGERY  1994   Still fragments cut out   SPINE SURGERY  2010/2005/2003   C6-7, L-5, L-4   UPPER GASTROINTESTINAL ENDOSCOPY     Social History   Socioeconomic History   Marital status: Married    Spouse name: Not on file   Number of children: 2   Years of education: Not on file   Highest education level: Associate degree: occupational, scientist, product/process development, or vocational program  Occupational History    Employer: [employer]  Tobacco Use   Smoking status: Never   Smokeless tobacco: Former    Types: Chew    Quit date: 11/10/2000  Vaping Use   Vaping status: Never Used  Substance and Sexual Activity   Alcohol use: Yes    Alcohol/week: 4.0 - 6.0 standard drinks of alcohol    Types: 2 - 3 Cans of beer, 2 - 3 Shots of liquor per  week    Comment: more during the summer and minimal after labor day   Drug use: No   Sexual activity: Not Currently    Partners: Female    Birth control/protection: None  Other Topics Concern   Not on file  Social History Narrative   Not on file   Social Drivers of Health   Financial Resource Strain: Low Risk  (12/21/2023)   Overall Financial Resource Strain (CARDIA)    Difficulty of Paying Living Expenses: Not very hard  Food Insecurity: No Food Insecurity (12/21/2023)   Hunger Vital Sign    Worried About Running Out of Food in the Last Year: Never true    Ran Out of Food in the Last Year: Never true  Transportation Needs: No Transportation Needs (12/21/2023)   PRAPARE - Administrator, Civil Service (Medical): No    Lack of Transportation (Non-Medical): No  Physical Activity: Inactive (12/21/2023)   Exercise Vital Sign    Days of Exercise per Week: 5 days    Minutes of Exercise per Session: 0 min  Stress: No Stress Concern Present (12/21/2023)   [employer] of Occupational Health - Occupational Stress Questionnaire    Feeling of Stress: Not at all  Social Connections:  Moderately Isolated (12/21/2023)   Social Connection and Isolation Panel    Frequency of Communication with Friends and Family: Once a week    Frequency of Social Gatherings with Friends and Family: Never    Attends Religious Services: More than 4 times per year    Active Member of [employer] or Organizations: No    Attends Engineer, Structural: Not on file    Marital Status: Married   Allergies  Allergen Reactions   Iodides Hives   Family History  Problem Relation Age of Onset   Lung cancer Mother    Breast cancer Mother    Dementia Mother    Cancer Mother    Esophageal cancer Father    Heart disease Sister    Melanoma Sister    Ovarian cancer Sister    COPD Sister    Heart disease Maternal Uncle    Brain cancer Paternal Uncle    Arthritis Maternal Grandmother    Cancer  Maternal Grandfather    Diabetes Paternal Aunt    Colon cancer Neg Hx    Rectal cancer Neg Hx    Stomach cancer Neg Hx      Current Outpatient Medications (Cardiovascular):    losartan  (COZAAR ) 50 MG tablet, Take 1 tablet (50 mg total) by mouth daily for blood pressure  Current Outpatient Medications (Respiratory):    fexofenadine (ALLEGRA) 180 MG tablet, Take 180 mg by mouth daily.     Current Outpatient Medications (Other):    Ascorbic Acid (VITAMIN C ) 1000 MG tablet, Take 1 tablet Daily   Bioflavonoid Products (BIOFLEX PO), Take by mouth.   Cholecalciferol 5000 units capsule, Take 5,000 Units by mouth daily.   hydrocortisone -pramoxine (ANALPRAM -HC) 2.5-1 % rectal cream, Place rectally 3 (three) times daily as needed for hemorrhoids or anal itching.   Magnesium  400 MG TABS, Take by mouth. Daily but sometime BID   omeprazole  (PRILOSEC) 40 MG capsule, Take 1 capsule (40 mg total) by mouth daily for indigestion and heartburn.   OVER THE COUNTER MEDICATION, TUMERIC   Probiotic Product (PROBIOTIC DAILY PO), Take by mouth daily.   Zinc 50 MG CAPS, Take by mouth daily.   Reviewed prior external information including notes and imaging from  primary care provider As well as notes that were available from care everywhere and other healthcare systems.  Past medical history, social, surgical and family history all reviewed in electronic medical record.  No pertanent information unless stated regarding to the chief complaint.   Review of Systems:  No headache, visual changes, nausea, vomiting, diarrhea, constipation, dizziness, abdominal pain, skin rash, fevers, chills, night sweats, weight loss, swollen lymph nodes, body aches, joint swelling, chest pain, shortness of breath, mood changes. POSITIVE muscle aches  Objective  Blood pressure 124/72, pulse 76, height 5' 9 (1.753 m), weight 205 lb (93 kg), SpO2 97%.   General: No apparent distress alert and oriented x3 mood and affect  normal, dressed appropriately.  HEENT: Pupils equal, extraocular movements intact  Respiratory: Patient's speak in full sentences and does not appear short of breath  Cardiovascular: No lower extremity edema, non tender, no erythema  Bilateral shoulder exam shows mild atrophy of the musculature bilaterally.  Patient does have positive crossover bilaterally.  Rotator cuff strength appears to be intact but significant impingement with Neer and Hawking's on the left side.  Patient even has some limited range of motion especially with internal range of motion to lateral hip.   Limited muscular skeletal ultrasound was performed and interpreted  by Griffin HUSSAR, M  Limited ultrasound shows significant arthritic changes of the acromioclavicular joint bilaterally left greater than right.  Patient also has significant subacromial bursitis noted more posterior.  Hypoechoic changes in the bicep tendon sheath as well as on the posterior aspect of the glenohumeral area. 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  Verbal informed consent obtained.  Time-out conducted.  Noted no overlying erythema, induration, or other signs of local infection.  Skin prepped in a sterile fashion.  Local anesthesia: Topical Ethyl chloride.  With sterile technique and under real time ultrasound guidance: With a 25-gauge half inch needle injected with 0.5 cc of 0.5% Marcaine and 0.5 cc of Kenalog 40 mg/mL Completed without difficulty  Pain immediately resolved suggesting accurate placement of the medication.  Advised to call if  fevers/chills, erythema, induration, drainage, or persistent bleeding.  Images saved Impression: Technically successful ultrasound guided injection.  Procedure: Real-time Ultrasound Guided Injection of left acromioclavicular joint Device: GE Logiq Q7 Ultrasound guided injection is preferred based studies that show increased duration, increased effect, greater accuracy, decreased procedural pain, increased response rate, and decreased cost with ultrasound guided versus blind injection.  Verbal informed consent obtained.  Time-out conducted.  Noted no overlying erythema, induration, or other signs of local infection.  Skin prepped in a sterile fashion.  Local anesthesia: Topical Ethyl chloride.  With sterile technique and under real time ultrasound guidance: With a 25-gauge half inch needle injected with 0.5 cc of 0.5% Marcaine and 0.5 cc of Kenalog 40 mg/mL Completed without difficulty  Pain immediately resolved suggesting accurate placement of the medication.  Advised to call if fevers/chills, erythema, induration, drainage, or persistent bleeding.  Images saved Impression: Technically successful ultrasound guided injection.  Procedure: Real-time Ultrasound Guided Injection of left glenohumeral joint Device: GE Logiq E  Ultrasound guided injection is preferred based studies that show increased duration, increased effect, greater accuracy, decreased procedural pain, increased response rate with ultrasound guided versus blind injection.  Verbal informed consent obtained.  Time-out conducted.  Noted no overlying erythema, induration, or other signs of local infection.  Skin prepped in a sterile fashion.  Local anesthesia: Topical Ethyl chloride.  With sterile technique and under real time ultrasound guidance:  Joint visualized.  21g 2 inch needle inserted posterior approach. Pictures taken for needle placement. Patient did have injection of 2 cc of 0.5% Marcaine, and 1cc of Kenalog 40  mg/dL. Completed without difficulty  Pain immediately resolved suggesting accurate placement of the medication.  Advised to call if fevers/chills, erythema, induration, drainage, or persistent bleeding.  Images permanently stored  Impression: Technically successful ultrasound guided injection.    Impression and Recommendations:    The above documentation has been reviewed and is accurate and complete Norm Wray M Queen Abbett, DO

## 2024-01-01 ENCOUNTER — Encounter: Payer: Self-pay | Admitting: Family Medicine

## 2024-01-01 ENCOUNTER — Ambulatory Visit: Admitting: Family Medicine

## 2024-01-01 ENCOUNTER — Other Ambulatory Visit: Payer: Self-pay

## 2024-01-01 ENCOUNTER — Ambulatory Visit (INDEPENDENT_AMBULATORY_CARE_PROVIDER_SITE_OTHER)

## 2024-01-01 VITALS — BP 124/72 | HR 76 | Ht 69.0 in | Wt 205.0 lb

## 2024-01-01 DIAGNOSIS — M19012 Primary osteoarthritis, left shoulder: Secondary | ICD-10-CM

## 2024-01-01 DIAGNOSIS — M25512 Pain in left shoulder: Secondary | ICD-10-CM

## 2024-01-01 DIAGNOSIS — M25562 Pain in left knee: Secondary | ICD-10-CM

## 2024-01-01 DIAGNOSIS — M19019 Primary osteoarthritis, unspecified shoulder: Secondary | ICD-10-CM | POA: Insufficient documentation

## 2024-01-01 DIAGNOSIS — G8929 Other chronic pain: Secondary | ICD-10-CM | POA: Diagnosis not present

## 2024-01-01 DIAGNOSIS — M19011 Primary osteoarthritis, right shoulder: Secondary | ICD-10-CM

## 2024-01-01 DIAGNOSIS — M25511 Pain in right shoulder: Secondary | ICD-10-CM

## 2024-01-01 NOTE — Assessment & Plan Note (Signed)
 Mild to moderate arthritis, given exercises for VMO strengthening

## 2024-01-01 NOTE — Patient Instructions (Signed)
 Xray today Injections in shoulders today Do prescribed exercises at least 3x a week  See you again in 2 months

## 2024-01-01 NOTE — Assessment & Plan Note (Addendum)
 I believe it is multifactorial.  Does seem to be potentially secondary to the acromioclavicular arthritis and an improvement in range of motion almost immediately.  Subacromial bursitis secondary to repetitive activity.  Discussed icing regimen, home exercises, which activities to do and which ones to avoid.  Differential also includes some cervical radiculopathy.  Will get x-rays of the neck to further evaluate increase activity slowly.  Follow-up again in 6 to 12 weeks.

## 2024-01-01 NOTE — Assessment & Plan Note (Signed)
 Bilateral injections given today.  Does do a lot of manual labor.  Discussed icing regimen and home exercises, discussed which activities to do and which ones to avoid.  Increase activity slowly.  Discussed icing regimen.  Follow-up again in 6 to 12 weeks.

## 2024-01-05 ENCOUNTER — Ambulatory Visit: Payer: Self-pay | Admitting: Family Medicine

## 2024-02-17 ENCOUNTER — Encounter: Payer: BC Managed Care – PPO | Admitting: Internal Medicine

## 2024-02-24 ENCOUNTER — Encounter: Payer: BC Managed Care – PPO | Admitting: Internal Medicine

## 2024-03-02 ENCOUNTER — Other Ambulatory Visit: Payer: Self-pay

## 2024-03-02 ENCOUNTER — Ambulatory Visit (INDEPENDENT_AMBULATORY_CARE_PROVIDER_SITE_OTHER): Admitting: Family Medicine

## 2024-03-02 VITALS — BP 122/70 | HR 77 | Ht 69.0 in | Wt 206.0 lb

## 2024-03-02 DIAGNOSIS — M25512 Pain in left shoulder: Secondary | ICD-10-CM | POA: Diagnosis not present

## 2024-03-02 NOTE — Patient Instructions (Signed)
 Good to see you! Keep watching it and doing the same routine See you again in 3 months just in case

## 2024-03-02 NOTE — Assessment & Plan Note (Signed)
 Stable at the moment.  Known some arthritic changes.  Patient is doing well.  Differential includes lumbar radiculopathy.  Hold on any type of injections today.  Follow-up with me again in 3 months

## 2024-03-02 NOTE — Progress Notes (Signed)
 " [facility] [address] [place] [address] Phone: [phone] Subjective:   Albert Griffin, am serving as a scribe for Dr. Arthea Griffin.  I'm seeing this patient by the request  of:  Alvia Corean CROME, FNP  CC: Bilateral shoulder and neck pain follow-up  YEP:Dlagzrupcz  Albert Griffin is a 64 y.o. male coming in with complaint of Bilateral shoulder pain.  Seen 2 months ago and given bilateral acromioclavicular and left glenohumeral injection.  Patient was to do conservative therapy.  Patient states R shoulder is doing well, L shoulder starting to feel pain at the end ranges. Has been using pain patches and topicals. Has helped a bit. Neck is doing well, but stiff and hit or miss when it comes to discomfort.    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  Past Medical History:  Diagnosis Date   Allergic rhinitis    Anemia    unsure of this   Arthritis    Chest pain    GERD (gastroesophageal reflux disease)    GERD (gastroesophageal reflux disease) 08/01/2013   Hypertension    Lung nodule, solitary 11/11/2014   11/11/2014 nodule noted; follow up CT done 11/28/2015- nodule stable, no follow up recommended.    Obesity    Tendonitis    Vitamin B12 deficiency    Past Surgical History:  Procedure Laterality Date   BACK SURGERY     x2, L4-L5   BUNIONECTOMY Left 02/14/2014   CERVICAL DISC SURGERY  2003   COLONOSCOPY     EYE SURGERY  1994   Still fragments cut out   SPINE SURGERY  2010/2005/2003   C6-7, L-5, L-4   UPPER GASTROINTESTINAL ENDOSCOPY     Social History   Socioeconomic History   Marital status: Married    Spouse name: Not on file   Number of children: 2   Years of education: Not on file   Highest education level: Associate degree: occupational, scientist, product/process development, or vocational program   Occupational History    Employer: [employer]  Tobacco Use   Smoking status: Never   Smokeless tobacco: Former    Types: Chew    Quit date: 11/10/2000  Vaping Use   Vaping status: Never Used  Substance and Sexual Activity   Alcohol use: Yes    Alcohol/week: 4.0 - 6.0 standard drinks of alcohol    Types: 2 - 3 Cans of beer, 2 - 3 Shots of liquor per week    Comment: more during the summer and minimal after labor day   Drug use: No   Sexual activity: Not Currently    Partners: Female    Birth control/protection: None  Other Topics Concern   Not on file  Social History Narrative   Not on file   Social Drivers of Health   Tobacco Use: Medium Risk (01/01/2024)   Patient History    Smoking Tobacco Use: Never    Smokeless Tobacco Use: Former    Passive Exposure: Not on Actuary Strain: Low Risk (12/21/2023)   Overall Financial Resource Strain (CARDIA)    Difficulty of Paying Living Expenses: Not very hard  Food Insecurity: No Food Insecurity (12/21/2023)   Epic    Worried About Radiation Protection Practitioner of Food in the Last Year: Never true    Ran Out of Food in the Last Year: Never true  Transportation  Needs: No Transportation Needs (12/21/2023)   Epic    Lack of Transportation (Medical): No    Lack of Transportation (Non-Medical): No  Physical Activity: Inactive (12/21/2023)   Exercise Vital Sign    Days of Exercise per Week: 5 days    Minutes of Exercise per Session: 0 min  Stress: No Stress Concern Present (12/21/2023)   [employer] of Occupational Health - Occupational Stress Questionnaire    Feeling of Stress: Not at all  Social Connections: Moderately Isolated (12/21/2023)   Social Connection and Isolation Panel    Frequency of Communication with Friends and Family: Once a week    Frequency of Social Gatherings with Friends and Family: Never    Attends Religious Services: More than 4 times per year    Active Member of Clubs or Organizations: No    Attends Museum/gallery Exhibitions Officer: Not on file    Marital Status: Married  Depression (PHQ2-9): Low Risk (07/24/2023)   Depression (PHQ2-9)    PHQ-2 Score: 0  Alcohol Screen: Low Risk (12/21/2023)   Alcohol Screen    Last Alcohol Screening Score (AUDIT): 2  Housing: Low Risk (12/21/2023)   Epic    Unable to Pay for Housing in the Last Year: No    Number of Times Moved in the Last Year: 0    Homeless in the Last Year: No  Utilities: Not on file  Health Literacy: Not on file   Allergies[1] Family History  Problem Relation Age of Onset   Lung cancer Mother    Breast cancer Mother    Dementia Mother    Cancer Mother    Esophageal cancer Father    Heart disease Sister    Melanoma Sister    Ovarian cancer Sister    COPD Sister    Heart disease Maternal Uncle    Brain cancer Paternal Uncle    Arthritis Maternal Grandmother    Cancer Maternal Grandfather    Diabetes Paternal Aunt    Colon cancer Neg Hx    Rectal cancer Neg Hx    Stomach cancer Neg Hx     Current Outpatient Medications (Cardiovascular):    losartan  (COZAAR ) 50 MG tablet, Take 1 tablet (50 mg total) by mouth daily for blood pressure  Current Outpatient Medications (Respiratory):    fexofenadine (ALLEGRA) 180 MG tablet, Take 180 mg by mouth daily.   Current Outpatient Medications (Other):    Ascorbic Acid (VITAMIN C ) 1000 MG tablet, Take 1 tablet Daily   Bioflavonoid Products (BIOFLEX PO), Take by mouth.   Cholecalciferol 5000 units capsule, Take 5,000 Units by mouth daily.   hydrocortisone -pramoxine (ANALPRAM -HC) 2.5-1 % rectal cream, Place rectally 3 (three) times daily as needed for hemorrhoids or anal itching.   Magnesium  400 MG TABS, Take by mouth. Daily but sometime BID   omeprazole  (PRILOSEC) 40 MG capsule, Take 1 capsule (40 mg total) by mouth daily for indigestion and heartburn.   OVER THE COUNTER MEDICATION, TUMERIC   Probiotic Product (PROBIOTIC DAILY PO), Take by mouth daily.   Zinc 50 MG CAPS, Take by mouth  daily.   Objective  Blood pressure 122/70, pulse 77, height 5' 9 (1.753 m), weight 206 lb (93.4 kg), SpO2 96%.   General: No apparent distress alert and oriented x3 mood and affect normal, dressed appropriately.  HEENT: Pupils equal, extraocular movements intact  Respiratory: Patient's speak in full sentences and does not appear short of breath  Cardiovascular: No lower extremity edema, non tender, no erythema  Neck exam improvement in range of motion but still has some limited left-sided sidebending and right sided rotation.  Lacks the last 5 degrees of extension of the neck noted.  Negative Spurling's.  Shoulder exam significant improvement in range of motion.  Patient does have improvement in strength also noted.    Impression and Recommendations:     The above documentation has been reviewed and is accurate and complete Arthea CHRISTELLA Sharps, DO       [1]  Allergies Allergen Reactions   Iodides Hives   "

## 2024-03-23 ENCOUNTER — Other Ambulatory Visit (HOSPITAL_COMMUNITY): Payer: Self-pay

## 2024-06-01 ENCOUNTER — Ambulatory Visit: Admitting: Family Medicine

## 2024-06-21 ENCOUNTER — Encounter: Admitting: Family Medicine
